# Patient Record
Sex: Male | Born: 1935 | Race: White | Hispanic: No | Marital: Married | Smoking: Current every day smoker
Health system: Southern US, Community
[De-identification: ages and names within clinical notes are randomized; demographics above are authoritative.]

## PROBLEM LIST (undated history)

## (undated) DIAGNOSIS — F0390 Unspecified dementia without behavioral disturbance: Secondary | ICD-10-CM

## (undated) DIAGNOSIS — J189 Pneumonia, unspecified organism: Secondary | ICD-10-CM

## (undated) DIAGNOSIS — I2510 Atherosclerotic heart disease of native coronary artery without angina pectoris: Secondary | ICD-10-CM

## (undated) DIAGNOSIS — C4490 Unspecified malignant neoplasm of skin, unspecified: Secondary | ICD-10-CM

## (undated) DIAGNOSIS — I714 Abdominal aortic aneurysm, without rupture, unspecified: Secondary | ICD-10-CM

## (undated) DIAGNOSIS — I10 Essential (primary) hypertension: Secondary | ICD-10-CM

## (undated) DIAGNOSIS — E7800 Pure hypercholesterolemia, unspecified: Secondary | ICD-10-CM

## (undated) DIAGNOSIS — W19XXXA Unspecified fall, initial encounter: Secondary | ICD-10-CM

## (undated) DIAGNOSIS — I4891 Unspecified atrial fibrillation: Secondary | ICD-10-CM

## (undated) DIAGNOSIS — J449 Chronic obstructive pulmonary disease, unspecified: Secondary | ICD-10-CM

## (undated) DIAGNOSIS — H9190 Unspecified hearing loss, unspecified ear: Secondary | ICD-10-CM

## (undated) DIAGNOSIS — M1990 Unspecified osteoarthritis, unspecified site: Secondary | ICD-10-CM

## (undated) DIAGNOSIS — Y92009 Unspecified place in unspecified non-institutional (private) residence as the place of occurrence of the external cause: Secondary | ICD-10-CM

## (undated) HISTORY — PX: TOOTH EXTRACTION: SUR596

## (undated) HISTORY — PX: INCISION AND DRAINAGE: SHX5863

## (undated) HISTORY — PX: CATARACT EXTRACTION, BILATERAL: SHX1313

## (undated) HISTORY — PX: APPENDECTOMY: SHX54

## (undated) HISTORY — PX: CORONARY ANGIOPLASTY WITH STENT PLACEMENT: SHX49

## (undated) HISTORY — PX: CARDIAC CATHETERIZATION: SHX172

---

## 2001-11-23 ENCOUNTER — Inpatient Hospital Stay (HOSPITAL_COMMUNITY): Admission: EM | Admit: 2001-11-23 | Discharge: 2001-11-27 | Payer: Self-pay | Admitting: Emergency Medicine

## 2001-11-23 ENCOUNTER — Encounter: Payer: Self-pay | Admitting: Emergency Medicine

## 2001-11-23 ENCOUNTER — Encounter (INDEPENDENT_AMBULATORY_CARE_PROVIDER_SITE_OTHER): Payer: Self-pay | Admitting: *Deleted

## 2009-08-15 ENCOUNTER — Emergency Department (HOSPITAL_COMMUNITY): Admission: EM | Admit: 2009-08-15 | Discharge: 2009-08-15 | Payer: Self-pay | Admitting: Emergency Medicine

## 2010-08-28 LAB — DIFFERENTIAL
Basophils Absolute: 0 10*3/uL (ref 0.0–0.1)
Basophils Relative: 1 % (ref 0–1)
Eosinophils Absolute: 1.2 10*3/uL — ABNORMAL HIGH (ref 0.0–0.7)
Eosinophils Relative: 17 % — ABNORMAL HIGH (ref 0–5)
Lymphocytes Relative: 11 % — ABNORMAL LOW (ref 12–46)
Lymphs Abs: 0.8 10*3/uL (ref 0.7–4.0)
Monocytes Absolute: 0.6 10*3/uL (ref 0.1–1.0)
Monocytes Relative: 8 % (ref 3–12)
Neutro Abs: 4.5 10*3/uL (ref 1.7–7.7)
Neutrophils Relative %: 64 % (ref 43–77)

## 2010-08-28 LAB — COMPREHENSIVE METABOLIC PANEL
ALT: 12 U/L (ref 0–53)
AST: 19 U/L (ref 0–37)
Albumin: 3.6 g/dL (ref 3.5–5.2)
Alkaline Phosphatase: 58 U/L (ref 39–117)
BUN: 15 mg/dL (ref 6–23)
CO2: 27 mEq/L (ref 19–32)
Calcium: 8.2 mg/dL — ABNORMAL LOW (ref 8.4–10.5)
Chloride: 103 mEq/L (ref 96–112)
Creatinine, Ser: 0.99 mg/dL (ref 0.4–1.5)
GFR calc Af Amer: >60 mL/min (ref >60)
GFR calc non Af Amer: >60 mL/min (ref >60)
Glucose, Bld: 93 mg/dL (ref 70–99)
Potassium: 3.6 mEq/L (ref 3.5–5.1)
Sodium: 135 mEq/L (ref 135–145)
Total Bilirubin: 0.6 mg/dL (ref 0.3–1.2)
Total Protein: 6.2 g/dL (ref 6.0–8.3)

## 2010-08-28 LAB — URINALYSIS, ROUTINE W REFLEX MICROSCOPIC
Glucose, UA: NEGATIVE mg/dL
Ketones, ur: NEGATIVE mg/dL
Leukocytes, UA: NEGATIVE
Nitrite: NEGATIVE
Specific Gravity, Urine: 1.018 (ref 1.005–1.030)
pH: 6.5 (ref 5.0–8.0)

## 2010-08-28 LAB — CBC
HCT: 48.6 % (ref 39.0–52.0)
Hemoglobin: 16.3 g/dL (ref 13.0–17.0)
MCHC: 33.6 g/dL (ref 30.0–36.0)
MCV: 99.9 fL (ref 78.0–100.0)
Platelets: 142 10*3/uL — ABNORMAL LOW (ref 150–400)
RBC: 4.86 MIL/uL (ref 4.22–5.81)
RDW: 13.8 % (ref 11.5–15.5)
WBC: 7.1 10*3/uL (ref 4.0–10.5)

## 2010-08-28 LAB — URINE MICROSCOPIC-ADD ON

## 2010-10-21 NOTE — Op Note (Signed)
. [facility]  Patient:    Jeremy Macdonald, Jeremy Macdonald Visit Number: [id number] MRN: [id number]          Service Type: MED Location: 5500 [phone] Attending Physician:  Cherylynn Ridges Dictated by:   Jimmye Norman, M.D. Proc. Date: 11/24/01 Admit Date:  11/23/2001                             Operative Report  PREOPERATIVE DIAGNOSIS:  Acute appendicitis.  POSTOPERATIVE DIAGNOSIS:  Acute gangrenous, ruptured appendicitis.  PROCEDURES: 1. Diagnostic laparoscopy. 2. Open appendectomy.  SURGEON:  Jimmye Norman, M.D.  ASSISTANT:  None.  ANESTHESIA:  General endotracheal.  ESTIMATED BLOOD LOSS:  150 cc.  COMPLICATIONS:  None.  BLEEDING:  From appendiceal artery.  CONDITION:  Stable.  DISPOSITION:  To PACU to unit 5700.  INDICATIONS FOR OPERATION:  The patient is a 77 year old with abdominal pain localized to the right lower quadrant.  He comes in for an appendectomy.  FINDINGS:  The patient, on laparoscopy, had a completely adhesed, very consolidated right lower quadrant abscess and gangrenous appendix.  This could not be taken out laparoscopically.  Also, during the procedure as the intraabdominal pressure would rise, the patients O2 saturations would drop. Therefore we had to abort the laparoscopic procedure for two reasons.  DESCRIPTION OF PROCEDURE:  The patient was taken to the operating room and placed on the table in the supine position.  After an adequate endotracheal anesthetic was administered, he was prepped and draped in the usual sterile manner, exposing the midline in the right lower quadrant.  A supraumbilical curvilinear incision was made using a #11 blade, taken down to the midline fascia -- through which a Veress needle was passed into the peritoneal cavity.  It was confirmed to be in position with the saline test. Once this was done, 10 and 11 mm cannulae and trocars were passed through the supraumbilical pouch into the  peritoneal cavity; confirmed to be in position with the laparoscope and attached camera light source.  With the patient in Trendelenburg position, left side tilted down, one could see the purulent fluid in the right lower quadrant and the adhesed stub mound cecum and the base of the appendix.  There were multiple adhesions of bowels attached to the appendix, but we could not free up the loop.  As we were doing so and the patients intraabdominal pressure rose to 13-15 mmHG, the O2 saturations dropped.  Initially we allowed all gas to escape and removed our instruments; allowed the patient to recover without problem.  His saturations came up once the pressure had gone down to 0.  We then insufflated again and the patients saturations dropped again.  Therefore, the decision was made to abort the laparoscopic procedure and to make right lower quadrant incision for any appendectomy -- because of how stuck and adhesed the appendix was, and also because of the dropping saturations.  We removed all cannulas and then made a right lower quadrant 6 cm incision using a #10 blade. It was taken down to and through the external oblique fascia, down into and through the transverse abdominis and internal oblique muscles, into the peritoneal cavity.  With Goelet retractors in place, we were able to mobilize the base of the cecum and the appendix (which had ruptured into the wound).  In doing so; however, the mesentery was torn and there was bleeding from the appendiceal artery.  This was eventually  controlled with figure-of-eight stitches of 2-0 silk.  The base of the appendix was taken using a TA30 stapler.  He was secured adequately.  Once we had control of the bleeding and the base was found to be adequate, we allowed the cecum to fall back into its peritoneal cavity.  We irrigated with saline up to approximately one liter.  We then closed in several layers, leaving the skin open.  The peritoneum was  closed using running 3-0 PDS.  The internal oblique and transverse abdominis muscle were reapproximated using  figure-of-eight stitch of old PDS.  The SL and deep fascia was closed using a running 0 PDS, then the skin was left open and packed with aspirin and Betadine soaked gauze.  The midline supraumbilical fascia was closed using figure-of-eight stitch of 0 Vicryl on a UR6 needle.  The skin was then closed using stainless steel staples at the supraumbilical site and the suprapubic sites.  Also in the right upper quadrant, where cannulas had been placed, these were closed also.  COUNTS:  All needle, sponge, and instrument counts were correct. Dictated by:   Jimmye Norman, M.D. Attending Physician:  Cherylynn Ridges DD:  11/24/01 TD:  11/25/01 Job: [id number]

## 2010-10-21 NOTE — Discharge Summary (Signed)
[place]. [facility]  Patient:    Jeremy Macdonald, Jeremy Macdonald Visit Number: [id number] MRN: [id number]          Service Type: MED Location: 5500 [phone] Attending Physician:  Cherylynn Ridges Dictated by:   Jimmye Norman, M.D. Admit Date:  11/23/2001 Discharge Date: 11/27/2001                             Discharge Summary  DISCHARGE DIAGNOSIS:  Ruptured acute appendicitis.  DISCHARGE MEDICATIONS: 1. Tylenol 2. Advil. 3. P.o. pain medications.  DIET:  Regular.  CONDITION ON DISCHARGE:  Stable.  HOSPITAL COURSE:  The patient was admitted with abdominal pain localized to the right lower quadrant.  He was taken to the OR where it was laparoscopically visualized ruptured appendix which had a lot of adhesions. Therefore, decision was made to open.  This was done with good ______.  He had an appendectomy without event.  He was eventually discharged home in care of his family.  He was afebrile at the time.  He was not sent home on antibiotics.  FOLLOWUP:  To see me in approximately one to two weeks. Dictated by:   Jimmye Norman, M.D. Attending Physician:  Cherylynn Ridges DD:  12/17/01 TD:  12/19/01 Job: [id number]

## 2010-10-21 NOTE — H&P (Signed)
[place]. [facility]  Patient:    Jeremy Macdonald, Jeremy Macdonald Visit Number: [id number] MRN: [id number]          Service Type: MED Location: 5500 [phone] Attending Physician:  Cherylynn Ridges Dictated by:   Jimmye Norman, M.D. Admit Date:  11/23/2001                           History and Physical  IDENTIFICATION AND COMPLAINT:  The patient is a 77 year old with likely acute appendicitis.  HISTORY OF PRESENT ILLNESS:  The patient came into the emergency room after over 24 hours of abdominal pain originating in the infraumbilical, periumbilical area, now localized to the right lower quadrant, associated with anorexia but no nausea, vomiting, but did have fevers and chills.  He had a fever of over 101 in the emergency room and right lower quadrant tenderness. A surgical consultation was obtained immediately.  PAST MEDICAL HISTORY:  Significant for only hypertension, for which he takes Lotensin.  He has had no cardiac, renal, vascular, or pulmonary disease.  ALLERGIES:  No known drug allergies.  SOCIAL HISTORY:  He is a nonsmoker.  SOCIAL HISTORY:  His only previous surgery has been for teeth, oral surgery.  REVIEW OF SYSTEMS:  He has had no diarrhea or constipation.  He has no nausea or vomiting.  PHYSICAL EXAMINATION:  VITAL SIGNS:  He had a temperature of 101.4.  His other vital signs were stable.  His temperature defervesced with Tylenol administered in the ED.  HEENT:  He is normocephalic and atraumatic and anicteric.  NECK:  Supple.  CHEST:  Clear.  CARDIAC:  No murmurs, gallops, rubs, or heaves.  ABDOMEN:  Soft but tender in the right lower quadrant with a positive Rovsing sign.  He does have abdominal bruits noted on both the left upper quadrant and the left lower quadrant and the right and the left upper quadrant.  It appears to be more prominent on the left, could represent renal artery stenosis.  RECTAL:  Unremarkable.  LABORATORY DATA:   White count of 14.7 thousand.  UA is reportedly normal.  IMPRESSION:  Acute appendicitis in a 77 year old male.  PLAN:  Do a laparoscopic appendectomy.  This will be done as soon as possible after the patient received preoperative antibiotics. Dictated by:   Jimmye Norman, M.D. Attending Physician:  Cherylynn Ridges DD:  11/23/01 TD:  11/25/01 Job: [id number]

## 2011-08-11 DIAGNOSIS — H2510 Age-related nuclear cataract, unspecified eye: Secondary | ICD-10-CM | POA: Diagnosis not present

## 2011-11-28 DIAGNOSIS — M25519 Pain in unspecified shoulder: Secondary | ICD-10-CM | POA: Diagnosis not present

## 2012-03-13 DIAGNOSIS — Z23 Encounter for immunization: Secondary | ICD-10-CM | POA: Diagnosis not present

## 2012-03-19 DIAGNOSIS — R310 Gross hematuria: Secondary | ICD-10-CM | POA: Diagnosis not present

## 2012-03-19 DIAGNOSIS — N401 Enlarged prostate with lower urinary tract symptoms: Secondary | ICD-10-CM | POA: Diagnosis not present

## 2012-03-22 DIAGNOSIS — H2510 Age-related nuclear cataract, unspecified eye: Secondary | ICD-10-CM | POA: Diagnosis not present

## 2012-04-12 DIAGNOSIS — Z79899 Other long term (current) drug therapy: Secondary | ICD-10-CM | POA: Diagnosis not present

## 2012-04-12 DIAGNOSIS — I10 Essential (primary) hypertension: Secondary | ICD-10-CM | POA: Diagnosis not present

## 2012-05-13 DIAGNOSIS — H269 Unspecified cataract: Secondary | ICD-10-CM | POA: Diagnosis not present

## 2012-05-13 DIAGNOSIS — H2510 Age-related nuclear cataract, unspecified eye: Secondary | ICD-10-CM | POA: Diagnosis not present

## 2012-05-14 DIAGNOSIS — H2510 Age-related nuclear cataract, unspecified eye: Secondary | ICD-10-CM | POA: Diagnosis not present

## 2012-06-03 DIAGNOSIS — H269 Unspecified cataract: Secondary | ICD-10-CM | POA: Diagnosis not present

## 2012-06-03 DIAGNOSIS — H2510 Age-related nuclear cataract, unspecified eye: Secondary | ICD-10-CM | POA: Diagnosis not present

## 2012-11-04 DIAGNOSIS — M25519 Pain in unspecified shoulder: Secondary | ICD-10-CM | POA: Diagnosis not present

## 2013-01-07 DIAGNOSIS — H18419 Arcus senilis, unspecified eye: Secondary | ICD-10-CM | POA: Diagnosis not present

## 2013-01-07 DIAGNOSIS — Z961 Presence of intraocular lens: Secondary | ICD-10-CM | POA: Diagnosis not present

## 2013-03-13 DIAGNOSIS — N400 Enlarged prostate without lower urinary tract symptoms: Secondary | ICD-10-CM | POA: Diagnosis not present

## 2013-03-13 DIAGNOSIS — M1990 Unspecified osteoarthritis, unspecified site: Secondary | ICD-10-CM | POA: Diagnosis not present

## 2013-03-13 DIAGNOSIS — L821 Other seborrheic keratosis: Secondary | ICD-10-CM | POA: Diagnosis not present

## 2013-03-13 DIAGNOSIS — I10 Essential (primary) hypertension: Secondary | ICD-10-CM | POA: Diagnosis not present

## 2013-03-13 DIAGNOSIS — Z23 Encounter for immunization: Secondary | ICD-10-CM | POA: Diagnosis not present

## 2013-03-24 DIAGNOSIS — N401 Enlarged prostate with lower urinary tract symptoms: Secondary | ICD-10-CM | POA: Diagnosis not present

## 2013-05-26 DIAGNOSIS — S6990XA Unspecified injury of unspecified wrist, hand and finger(s), initial encounter: Secondary | ICD-10-CM | POA: Diagnosis not present

## 2013-05-26 DIAGNOSIS — Z23 Encounter for immunization: Secondary | ICD-10-CM | POA: Diagnosis not present

## 2013-07-30 DIAGNOSIS — R05 Cough: Secondary | ICD-10-CM | POA: Diagnosis not present

## 2013-07-30 DIAGNOSIS — R059 Cough, unspecified: Secondary | ICD-10-CM | POA: Diagnosis not present

## 2014-01-13 DIAGNOSIS — Z961 Presence of intraocular lens: Secondary | ICD-10-CM | POA: Diagnosis not present

## 2014-01-13 DIAGNOSIS — H18419 Arcus senilis, unspecified eye: Secondary | ICD-10-CM | POA: Diagnosis not present

## 2014-01-13 DIAGNOSIS — H02839 Dermatochalasis of unspecified eye, unspecified eyelid: Secondary | ICD-10-CM | POA: Diagnosis not present

## 2014-03-17 DIAGNOSIS — Z23 Encounter for immunization: Secondary | ICD-10-CM | POA: Diagnosis not present

## 2014-03-25 DIAGNOSIS — R310 Gross hematuria: Secondary | ICD-10-CM | POA: Diagnosis not present

## 2014-03-25 DIAGNOSIS — N401 Enlarged prostate with lower urinary tract symptoms: Secondary | ICD-10-CM | POA: Diagnosis not present

## 2014-03-25 DIAGNOSIS — R351 Nocturia: Secondary | ICD-10-CM | POA: Diagnosis not present

## 2014-05-14 DIAGNOSIS — F17200 Nicotine dependence, unspecified, uncomplicated: Secondary | ICD-10-CM | POA: Diagnosis not present

## 2014-05-14 DIAGNOSIS — J449 Chronic obstructive pulmonary disease, unspecified: Secondary | ICD-10-CM | POA: Diagnosis not present

## 2014-05-14 DIAGNOSIS — J40 Bronchitis, not specified as acute or chronic: Secondary | ICD-10-CM | POA: Diagnosis not present

## 2014-05-21 DIAGNOSIS — J40 Bronchitis, not specified as acute or chronic: Secondary | ICD-10-CM | POA: Diagnosis not present

## 2014-05-21 DIAGNOSIS — J449 Chronic obstructive pulmonary disease, unspecified: Secondary | ICD-10-CM | POA: Diagnosis not present

## 2014-05-21 DIAGNOSIS — F17200 Nicotine dependence, unspecified, uncomplicated: Secondary | ICD-10-CM | POA: Diagnosis not present

## 2014-07-02 DIAGNOSIS — J449 Chronic obstructive pulmonary disease, unspecified: Secondary | ICD-10-CM | POA: Diagnosis not present

## 2014-07-02 DIAGNOSIS — M1990 Unspecified osteoarthritis, unspecified site: Secondary | ICD-10-CM | POA: Diagnosis not present

## 2014-07-02 DIAGNOSIS — F17200 Nicotine dependence, unspecified, uncomplicated: Secondary | ICD-10-CM | POA: Diagnosis not present

## 2014-07-02 DIAGNOSIS — I10 Essential (primary) hypertension: Secondary | ICD-10-CM | POA: Diagnosis not present

## 2014-07-02 DIAGNOSIS — J209 Acute bronchitis, unspecified: Secondary | ICD-10-CM | POA: Diagnosis not present

## 2014-07-02 DIAGNOSIS — N400 Enlarged prostate without lower urinary tract symptoms: Secondary | ICD-10-CM | POA: Diagnosis not present

## 2014-10-01 DIAGNOSIS — I10 Essential (primary) hypertension: Secondary | ICD-10-CM | POA: Diagnosis not present

## 2014-10-01 DIAGNOSIS — H6980 Other specified disorders of Eustachian tube, unspecified ear: Secondary | ICD-10-CM | POA: Diagnosis not present

## 2014-10-01 DIAGNOSIS — J449 Chronic obstructive pulmonary disease, unspecified: Secondary | ICD-10-CM | POA: Diagnosis not present

## 2014-10-01 DIAGNOSIS — N400 Enlarged prostate without lower urinary tract symptoms: Secondary | ICD-10-CM | POA: Diagnosis not present

## 2014-10-01 DIAGNOSIS — R739 Hyperglycemia, unspecified: Secondary | ICD-10-CM | POA: Diagnosis not present

## 2014-10-01 DIAGNOSIS — M1990 Unspecified osteoarthritis, unspecified site: Secondary | ICD-10-CM | POA: Diagnosis not present

## 2014-10-01 DIAGNOSIS — F17200 Nicotine dependence, unspecified, uncomplicated: Secondary | ICD-10-CM | POA: Diagnosis not present

## 2015-01-07 DIAGNOSIS — J449 Chronic obstructive pulmonary disease, unspecified: Secondary | ICD-10-CM | POA: Diagnosis not present

## 2015-01-07 DIAGNOSIS — N400 Enlarged prostate without lower urinary tract symptoms: Secondary | ICD-10-CM | POA: Diagnosis not present

## 2015-01-07 DIAGNOSIS — I10 Essential (primary) hypertension: Secondary | ICD-10-CM | POA: Diagnosis not present

## 2015-01-07 DIAGNOSIS — L570 Actinic keratosis: Secondary | ICD-10-CM | POA: Diagnosis not present

## 2015-01-07 DIAGNOSIS — F17200 Nicotine dependence, unspecified, uncomplicated: Secondary | ICD-10-CM | POA: Diagnosis not present

## 2015-01-07 DIAGNOSIS — M1990 Unspecified osteoarthritis, unspecified site: Secondary | ICD-10-CM | POA: Diagnosis not present

## 2015-03-31 DIAGNOSIS — R351 Nocturia: Secondary | ICD-10-CM | POA: Diagnosis not present

## 2015-03-31 DIAGNOSIS — R3121 Asymptomatic microscopic hematuria: Secondary | ICD-10-CM | POA: Diagnosis not present

## 2015-03-31 DIAGNOSIS — N401 Enlarged prostate with lower urinary tract symptoms: Secondary | ICD-10-CM | POA: Diagnosis not present

## 2015-03-31 DIAGNOSIS — N390 Urinary tract infection, site not specified: Secondary | ICD-10-CM | POA: Diagnosis not present

## 2015-04-08 ENCOUNTER — Ambulatory Visit (INDEPENDENT_AMBULATORY_CARE_PROVIDER_SITE_OTHER): Payer: Medicare Other

## 2015-04-08 DIAGNOSIS — Z23 Encounter for immunization: Secondary | ICD-10-CM

## 2015-04-26 DIAGNOSIS — R3129 Other microscopic hematuria: Secondary | ICD-10-CM | POA: Diagnosis not present

## 2015-05-04 DIAGNOSIS — R310 Gross hematuria: Secondary | ICD-10-CM | POA: Diagnosis not present

## 2015-05-04 DIAGNOSIS — N401 Enlarged prostate with lower urinary tract symptoms: Secondary | ICD-10-CM | POA: Diagnosis not present

## 2015-05-04 DIAGNOSIS — R351 Nocturia: Secondary | ICD-10-CM | POA: Diagnosis not present

## 2015-05-10 DIAGNOSIS — N400 Enlarged prostate without lower urinary tract symptoms: Secondary | ICD-10-CM | POA: Diagnosis not present

## 2015-05-10 DIAGNOSIS — M1990 Unspecified osteoarthritis, unspecified site: Secondary | ICD-10-CM | POA: Diagnosis not present

## 2015-05-10 DIAGNOSIS — F17200 Nicotine dependence, unspecified, uncomplicated: Secondary | ICD-10-CM | POA: Diagnosis not present

## 2015-05-10 DIAGNOSIS — I10 Essential (primary) hypertension: Secondary | ICD-10-CM | POA: Diagnosis not present

## 2015-05-10 DIAGNOSIS — J449 Chronic obstructive pulmonary disease, unspecified: Secondary | ICD-10-CM | POA: Diagnosis not present

## 2015-05-10 DIAGNOSIS — L570 Actinic keratosis: Secondary | ICD-10-CM | POA: Diagnosis not present

## 2015-05-11 DIAGNOSIS — I10 Essential (primary) hypertension: Secondary | ICD-10-CM | POA: Diagnosis not present

## 2015-05-11 DIAGNOSIS — Z961 Presence of intraocular lens: Secondary | ICD-10-CM | POA: Diagnosis not present

## 2015-05-11 DIAGNOSIS — H02839 Dermatochalasis of unspecified eye, unspecified eyelid: Secondary | ICD-10-CM | POA: Diagnosis not present

## 2015-05-11 DIAGNOSIS — H18413 Arcus senilis, bilateral: Secondary | ICD-10-CM | POA: Diagnosis not present

## 2015-05-19 DIAGNOSIS — Z87891 Personal history of nicotine dependence: Secondary | ICD-10-CM | POA: Diagnosis not present

## 2015-05-19 DIAGNOSIS — I714 Abdominal aortic aneurysm, without rupture: Secondary | ICD-10-CM | POA: Diagnosis not present

## 2015-05-19 DIAGNOSIS — I70213 Atherosclerosis of native arteries of extremities with intermittent claudication, bilateral legs: Secondary | ICD-10-CM | POA: Diagnosis not present

## 2015-05-19 DIAGNOSIS — I10 Essential (primary) hypertension: Secondary | ICD-10-CM | POA: Diagnosis not present

## 2015-05-24 DIAGNOSIS — R0602 Shortness of breath: Secondary | ICD-10-CM | POA: Diagnosis not present

## 2015-05-24 DIAGNOSIS — I10 Essential (primary) hypertension: Secondary | ICD-10-CM | POA: Diagnosis not present

## 2015-06-01 DIAGNOSIS — I10 Essential (primary) hypertension: Secondary | ICD-10-CM | POA: Diagnosis not present

## 2015-06-01 DIAGNOSIS — R0602 Shortness of breath: Secondary | ICD-10-CM | POA: Diagnosis not present

## 2015-06-01 DIAGNOSIS — R0989 Other specified symptoms and signs involving the circulatory and respiratory systems: Secondary | ICD-10-CM | POA: Diagnosis not present

## 2015-06-03 DIAGNOSIS — I739 Peripheral vascular disease, unspecified: Secondary | ICD-10-CM | POA: Diagnosis not present

## 2015-06-18 DIAGNOSIS — I10 Essential (primary) hypertension: Secondary | ICD-10-CM | POA: Diagnosis not present

## 2015-06-18 DIAGNOSIS — I70213 Atherosclerosis of native arteries of extremities with intermittent claudication, bilateral legs: Secondary | ICD-10-CM | POA: Diagnosis not present

## 2015-06-18 DIAGNOSIS — I714 Abdominal aortic aneurysm, without rupture: Secondary | ICD-10-CM | POA: Diagnosis not present

## 2015-06-18 DIAGNOSIS — Z87891 Personal history of nicotine dependence: Secondary | ICD-10-CM | POA: Diagnosis not present

## 2015-06-25 DIAGNOSIS — D3132 Benign neoplasm of left choroid: Secondary | ICD-10-CM | POA: Diagnosis not present

## 2015-06-25 DIAGNOSIS — H353131 Nonexudative age-related macular degeneration, bilateral, early dry stage: Secondary | ICD-10-CM | POA: Diagnosis not present

## 2015-06-25 DIAGNOSIS — H35373 Puckering of macula, bilateral: Secondary | ICD-10-CM | POA: Diagnosis not present

## 2015-06-25 DIAGNOSIS — H43813 Vitreous degeneration, bilateral: Secondary | ICD-10-CM | POA: Diagnosis not present

## 2015-08-17 DIAGNOSIS — I714 Abdominal aortic aneurysm, without rupture: Secondary | ICD-10-CM | POA: Diagnosis not present

## 2015-09-07 DIAGNOSIS — I739 Peripheral vascular disease, unspecified: Secondary | ICD-10-CM | POA: Diagnosis not present

## 2015-09-07 DIAGNOSIS — I10 Essential (primary) hypertension: Secondary | ICD-10-CM | POA: Diagnosis not present

## 2015-09-07 DIAGNOSIS — M1990 Unspecified osteoarthritis, unspecified site: Secondary | ICD-10-CM | POA: Diagnosis not present

## 2015-09-07 DIAGNOSIS — F17200 Nicotine dependence, unspecified, uncomplicated: Secondary | ICD-10-CM | POA: Diagnosis not present

## 2015-09-07 DIAGNOSIS — N400 Enlarged prostate without lower urinary tract symptoms: Secondary | ICD-10-CM | POA: Diagnosis not present

## 2015-09-07 DIAGNOSIS — I714 Abdominal aortic aneurysm, without rupture: Secondary | ICD-10-CM | POA: Diagnosis not present

## 2015-09-07 DIAGNOSIS — J449 Chronic obstructive pulmonary disease, unspecified: Secondary | ICD-10-CM | POA: Diagnosis not present

## 2015-09-16 DIAGNOSIS — R0602 Shortness of breath: Secondary | ICD-10-CM | POA: Diagnosis not present

## 2015-09-16 DIAGNOSIS — I714 Abdominal aortic aneurysm, without rupture: Secondary | ICD-10-CM | POA: Diagnosis not present

## 2015-09-16 DIAGNOSIS — I10 Essential (primary) hypertension: Secondary | ICD-10-CM | POA: Diagnosis not present

## 2015-09-16 DIAGNOSIS — I70213 Atherosclerosis of native arteries of extremities with intermittent claudication, bilateral legs: Secondary | ICD-10-CM | POA: Diagnosis not present

## 2015-11-02 DIAGNOSIS — R351 Nocturia: Secondary | ICD-10-CM | POA: Diagnosis not present

## 2015-11-02 DIAGNOSIS — N401 Enlarged prostate with lower urinary tract symptoms: Secondary | ICD-10-CM | POA: Diagnosis not present

## 2015-11-02 DIAGNOSIS — N138 Other obstructive and reflux uropathy: Secondary | ICD-10-CM | POA: Diagnosis not present

## 2015-11-02 DIAGNOSIS — Z0000 Encounter for general adult medical examination without abnormal findings: Secondary | ICD-10-CM | POA: Diagnosis not present

## 2016-01-07 DIAGNOSIS — I10 Essential (primary) hypertension: Secondary | ICD-10-CM | POA: Diagnosis not present

## 2016-01-07 DIAGNOSIS — L570 Actinic keratosis: Secondary | ICD-10-CM | POA: Diagnosis not present

## 2016-01-07 DIAGNOSIS — N400 Enlarged prostate without lower urinary tract symptoms: Secondary | ICD-10-CM | POA: Diagnosis not present

## 2016-01-07 DIAGNOSIS — Z5181 Encounter for therapeutic drug level monitoring: Secondary | ICD-10-CM | POA: Diagnosis not present

## 2016-01-07 DIAGNOSIS — F17200 Nicotine dependence, unspecified, uncomplicated: Secondary | ICD-10-CM | POA: Diagnosis not present

## 2016-01-07 DIAGNOSIS — M1990 Unspecified osteoarthritis, unspecified site: Secondary | ICD-10-CM | POA: Diagnosis not present

## 2016-01-07 DIAGNOSIS — I714 Abdominal aortic aneurysm, without rupture: Secondary | ICD-10-CM | POA: Diagnosis not present

## 2016-01-07 DIAGNOSIS — J449 Chronic obstructive pulmonary disease, unspecified: Secondary | ICD-10-CM | POA: Diagnosis not present

## 2016-01-07 DIAGNOSIS — I739 Peripheral vascular disease, unspecified: Secondary | ICD-10-CM | POA: Diagnosis not present

## 2016-02-15 DIAGNOSIS — I6523 Occlusion and stenosis of bilateral carotid arteries: Secondary | ICD-10-CM | POA: Diagnosis not present

## 2016-02-15 DIAGNOSIS — I714 Abdominal aortic aneurysm, without rupture: Secondary | ICD-10-CM | POA: Diagnosis not present

## 2016-02-24 DIAGNOSIS — I10 Essential (primary) hypertension: Secondary | ICD-10-CM | POA: Diagnosis not present

## 2016-02-24 DIAGNOSIS — I714 Abdominal aortic aneurysm, without rupture: Secondary | ICD-10-CM | POA: Diagnosis not present

## 2016-02-24 DIAGNOSIS — I6523 Occlusion and stenosis of bilateral carotid arteries: Secondary | ICD-10-CM | POA: Diagnosis not present

## 2016-02-24 DIAGNOSIS — Z87891 Personal history of nicotine dependence: Secondary | ICD-10-CM | POA: Diagnosis not present

## 2016-03-16 DIAGNOSIS — D485 Neoplasm of uncertain behavior of skin: Secondary | ICD-10-CM | POA: Diagnosis not present

## 2016-03-16 DIAGNOSIS — L821 Other seborrheic keratosis: Secondary | ICD-10-CM | POA: Diagnosis not present

## 2016-03-16 DIAGNOSIS — L570 Actinic keratosis: Secondary | ICD-10-CM | POA: Diagnosis not present

## 2016-03-16 DIAGNOSIS — D1801 Hemangioma of skin and subcutaneous tissue: Secondary | ICD-10-CM | POA: Diagnosis not present

## 2016-03-16 DIAGNOSIS — C44329 Squamous cell carcinoma of skin of other parts of face: Secondary | ICD-10-CM | POA: Diagnosis not present

## 2016-03-16 DIAGNOSIS — L814 Other melanin hyperpigmentation: Secondary | ICD-10-CM | POA: Diagnosis not present

## 2016-03-16 DIAGNOSIS — C44602 Unspecified malignant neoplasm of skin of right upper limb, including shoulder: Secondary | ICD-10-CM | POA: Diagnosis not present

## 2016-03-23 ENCOUNTER — Ambulatory Visit (INDEPENDENT_AMBULATORY_CARE_PROVIDER_SITE_OTHER): Payer: Medicare Other | Admitting: Family Medicine

## 2016-03-23 DIAGNOSIS — Z23 Encounter for immunization: Secondary | ICD-10-CM

## 2016-04-03 DIAGNOSIS — R05 Cough: Secondary | ICD-10-CM | POA: Diagnosis not present

## 2016-04-12 DIAGNOSIS — C44622 Squamous cell carcinoma of skin of right upper limb, including shoulder: Secondary | ICD-10-CM | POA: Diagnosis not present

## 2016-04-12 DIAGNOSIS — L900 Lichen sclerosus et atrophicus: Secondary | ICD-10-CM | POA: Diagnosis not present

## 2016-04-21 DIAGNOSIS — C44329 Squamous cell carcinoma of skin of other parts of face: Secondary | ICD-10-CM | POA: Diagnosis not present

## 2016-05-08 DIAGNOSIS — I10 Essential (primary) hypertension: Secondary | ICD-10-CM | POA: Diagnosis not present

## 2016-05-08 DIAGNOSIS — I714 Abdominal aortic aneurysm, without rupture: Secondary | ICD-10-CM | POA: Diagnosis not present

## 2016-05-08 DIAGNOSIS — I739 Peripheral vascular disease, unspecified: Secondary | ICD-10-CM | POA: Diagnosis not present

## 2016-05-08 DIAGNOSIS — M1990 Unspecified osteoarthritis, unspecified site: Secondary | ICD-10-CM | POA: Diagnosis not present

## 2016-05-08 DIAGNOSIS — Z5181 Encounter for therapeutic drug level monitoring: Secondary | ICD-10-CM | POA: Diagnosis not present

## 2016-05-08 DIAGNOSIS — J449 Chronic obstructive pulmonary disease, unspecified: Secondary | ICD-10-CM | POA: Diagnosis not present

## 2016-05-08 DIAGNOSIS — L570 Actinic keratosis: Secondary | ICD-10-CM | POA: Diagnosis not present

## 2016-05-08 DIAGNOSIS — F17200 Nicotine dependence, unspecified, uncomplicated: Secondary | ICD-10-CM | POA: Diagnosis not present

## 2016-05-08 DIAGNOSIS — N400 Enlarged prostate without lower urinary tract symptoms: Secondary | ICD-10-CM | POA: Diagnosis not present

## 2016-05-16 DIAGNOSIS — Z961 Presence of intraocular lens: Secondary | ICD-10-CM | POA: Diagnosis not present

## 2016-05-16 DIAGNOSIS — H02839 Dermatochalasis of unspecified eye, unspecified eyelid: Secondary | ICD-10-CM | POA: Diagnosis not present

## 2016-05-16 DIAGNOSIS — H18413 Arcus senilis, bilateral: Secondary | ICD-10-CM | POA: Diagnosis not present

## 2016-05-16 DIAGNOSIS — I10 Essential (primary) hypertension: Secondary | ICD-10-CM | POA: Diagnosis not present

## 2016-06-05 DIAGNOSIS — C4490 Unspecified malignant neoplasm of skin, unspecified: Secondary | ICD-10-CM

## 2016-06-05 HISTORY — PX: SKIN CANCER EXCISION: SHX779

## 2016-06-05 HISTORY — DX: Unspecified malignant neoplasm of skin, unspecified: C44.90

## 2016-08-24 DIAGNOSIS — I6523 Occlusion and stenosis of bilateral carotid arteries: Secondary | ICD-10-CM | POA: Diagnosis not present

## 2016-08-24 DIAGNOSIS — Z87891 Personal history of nicotine dependence: Secondary | ICD-10-CM | POA: Diagnosis not present

## 2016-08-24 DIAGNOSIS — I714 Abdominal aortic aneurysm, without rupture: Secondary | ICD-10-CM | POA: Diagnosis not present

## 2016-08-24 DIAGNOSIS — I10 Essential (primary) hypertension: Secondary | ICD-10-CM | POA: Diagnosis not present

## 2016-09-07 DIAGNOSIS — I10 Essential (primary) hypertension: Secondary | ICD-10-CM | POA: Diagnosis not present

## 2016-09-07 DIAGNOSIS — I739 Peripheral vascular disease, unspecified: Secondary | ICD-10-CM | POA: Diagnosis not present

## 2016-09-07 DIAGNOSIS — M1990 Unspecified osteoarthritis, unspecified site: Secondary | ICD-10-CM | POA: Diagnosis not present

## 2016-09-07 DIAGNOSIS — I714 Abdominal aortic aneurysm, without rupture: Secondary | ICD-10-CM | POA: Diagnosis not present

## 2016-09-07 DIAGNOSIS — Z5181 Encounter for therapeutic drug level monitoring: Secondary | ICD-10-CM | POA: Diagnosis not present

## 2016-09-07 DIAGNOSIS — N400 Enlarged prostate without lower urinary tract symptoms: Secondary | ICD-10-CM | POA: Diagnosis not present

## 2016-09-07 DIAGNOSIS — J449 Chronic obstructive pulmonary disease, unspecified: Secondary | ICD-10-CM | POA: Diagnosis not present

## 2016-09-07 DIAGNOSIS — M160 Bilateral primary osteoarthritis of hip: Secondary | ICD-10-CM | POA: Diagnosis not present

## 2016-09-07 DIAGNOSIS — F17200 Nicotine dependence, unspecified, uncomplicated: Secondary | ICD-10-CM | POA: Diagnosis not present

## 2016-09-07 DIAGNOSIS — L570 Actinic keratosis: Secondary | ICD-10-CM | POA: Diagnosis not present

## 2016-09-28 DIAGNOSIS — L814 Other melanin hyperpigmentation: Secondary | ICD-10-CM | POA: Diagnosis not present

## 2016-09-28 DIAGNOSIS — D1801 Hemangioma of skin and subcutaneous tissue: Secondary | ICD-10-CM | POA: Diagnosis not present

## 2016-09-28 DIAGNOSIS — L570 Actinic keratosis: Secondary | ICD-10-CM | POA: Diagnosis not present

## 2016-09-28 DIAGNOSIS — L821 Other seborrheic keratosis: Secondary | ICD-10-CM | POA: Diagnosis not present

## 2016-09-28 DIAGNOSIS — Z85828 Personal history of other malignant neoplasm of skin: Secondary | ICD-10-CM | POA: Diagnosis not present

## 2016-11-09 DIAGNOSIS — N401 Enlarged prostate with lower urinary tract symptoms: Secondary | ICD-10-CM | POA: Diagnosis not present

## 2016-11-09 DIAGNOSIS — R351 Nocturia: Secondary | ICD-10-CM | POA: Diagnosis not present

## 2016-11-09 DIAGNOSIS — R3129 Other microscopic hematuria: Secondary | ICD-10-CM | POA: Diagnosis not present

## 2016-11-21 ENCOUNTER — Emergency Department (HOSPITAL_COMMUNITY): Payer: Medicare Other

## 2016-11-21 ENCOUNTER — Other Ambulatory Visit (HOSPITAL_COMMUNITY): Payer: Self-pay | Admitting: Radiology

## 2016-11-21 ENCOUNTER — Encounter (HOSPITAL_COMMUNITY): Payer: Self-pay

## 2016-11-21 ENCOUNTER — Emergency Department (HOSPITAL_COMMUNITY)
Admission: EM | Admit: 2016-11-21 | Discharge: 2016-11-21 | Disposition: A | Discharge status: Home / Self Care | Payer: Medicare Other | Attending: Emergency Medicine | Admitting: Emergency Medicine

## 2016-11-21 DIAGNOSIS — J449 Chronic obstructive pulmonary disease, unspecified: Secondary | ICD-10-CM | POA: Diagnosis not present

## 2016-11-21 DIAGNOSIS — Z7982 Long term (current) use of aspirin: Secondary | ICD-10-CM | POA: Insufficient documentation

## 2016-11-21 DIAGNOSIS — Z791 Long term (current) use of non-steroidal anti-inflammatories (NSAID): Secondary | ICD-10-CM | POA: Diagnosis not present

## 2016-11-21 DIAGNOSIS — I10 Essential (primary) hypertension: Secondary | ICD-10-CM | POA: Insufficient documentation

## 2016-11-21 DIAGNOSIS — Z79899 Other long term (current) drug therapy: Secondary | ICD-10-CM | POA: Insufficient documentation

## 2016-11-21 DIAGNOSIS — F17200 Nicotine dependence, unspecified, uncomplicated: Secondary | ICD-10-CM | POA: Insufficient documentation

## 2016-11-21 DIAGNOSIS — R000 Tachycardia, unspecified: Secondary | ICD-10-CM | POA: Diagnosis not present

## 2016-11-21 DIAGNOSIS — I4891 Unspecified atrial fibrillation: Secondary | ICD-10-CM | POA: Diagnosis not present

## 2016-11-21 DIAGNOSIS — R531 Weakness: Secondary | ICD-10-CM | POA: Diagnosis not present

## 2016-11-21 DIAGNOSIS — R0789 Other chest pain: Secondary | ICD-10-CM | POA: Diagnosis not present

## 2016-11-21 DIAGNOSIS — I499 Cardiac arrhythmia, unspecified: Secondary | ICD-10-CM | POA: Diagnosis not present

## 2016-11-21 HISTORY — DX: Abdominal aortic aneurysm, without rupture, unspecified: I71.40

## 2016-11-21 HISTORY — DX: Abdominal aortic aneurysm, without rupture: I71.4

## 2016-11-21 HISTORY — DX: Essential (primary) hypertension: I10

## 2016-11-21 HISTORY — DX: Chronic obstructive pulmonary disease, unspecified: J44.9

## 2016-11-21 LAB — CBC
HEMATOCRIT: 48.5 % (ref 39.0–52.0)
HEMOGLOBIN: 16.1 g/dL (ref 13.0–17.0)
MCH: 32.1 pg (ref 26.0–34.0)
MCHC: 33.2 g/dL (ref 30.0–36.0)
MCV: 96.6 fL (ref 78.0–100.0)
Platelets: 130 10*3/uL — ABNORMAL LOW (ref 150–400)
RBC: 5.02 MIL/uL (ref 4.22–5.81)
RDW: 13.3 % (ref 11.5–15.5)
WBC: 8.6 10*3/uL (ref 4.0–10.5)

## 2016-11-21 LAB — BASIC METABOLIC PANEL
Anion gap: 12 (ref 5–15)
BUN: 26 mg/dL — ABNORMAL HIGH (ref 6–20)
CHLORIDE: 101 mmol/L (ref 101–111)
CO2: 23 mmol/L (ref 22–32)
Calcium: 8.9 mg/dL (ref 8.9–10.3)
Creatinine, Ser: 1.25 mg/dL — ABNORMAL HIGH (ref 0.61–1.24)
GFR calc Af Amer: 57 mL/min — ABNORMAL LOW (ref >60)
GFR, EST NON AFRICAN AMERICAN: 50 mL/min — AB (ref >60)
GLUCOSE: 105 mg/dL — AB (ref 65–99)
POTASSIUM: 4.1 mmol/L (ref 3.5–5.1)
Sodium: 136 mmol/L (ref 135–145)

## 2016-11-21 LAB — I-STAT TROPONIN, ED: Troponin i, poc: 0.04 ng/mL (ref 0.00–0.08)

## 2016-11-21 MED ORDER — SODIUM CHLORIDE 0.9 % IV BOLUS (SEPSIS)
500.0000 mL | Freq: Once | INTRAVENOUS | Status: AC
  Administered 2016-11-21: 500 mL via INTRAVENOUS

## 2016-11-21 NOTE — ED Provider Notes (Signed)
[place] DEPT Provider Note   CSN: [id number] Arrival date & time: 11/21/16  1924     History   Chief Complaint Chief Complaint  Patient presents with  . Chest Pain    HPI Jeremy Macdonald is a 83 y.o. male.  Patient is here for evaluation of weakness, with a vague chest discomfort, and a sensation of palpitation and mild shortness of breath for about 8 hours, following shooting at a gun club.  He noticed it was very hot in the area he was, and afterwards when he was walking outside he began to feel worse.  Later this evening he went to a clinic, where he was found to be in rapid atrial fibrillation.  No prior history of atrial fibrillation.  Remotely he has had some periods of regular heartbeat, typically associated with eating.  He denies other illnesses recently including nausea, vomiting, focal weakness, paresthesias, headache neck pain back pain or abdominal pain.  He has a history of an abdominal aortic aneurysm.  There are no other known modifying factors.  HPI  Past Medical History:  Diagnosis Date  . Abdominal aortic aneurysm (AAA) ([place])   . COPD (chronic obstructive pulmonary disease) ([place])   . Hypertension     There are no active problems to display for this patient.   History reviewed. No pertinent surgical history.     Home Medications    Prior to Admission medications   Medication Sig Start Date End Date Taking? Authorizing Provider  albuterol (PROVENTIL) (2.5 MG/3ML) 0.083% nebulizer solution Take 2.5 mg by nebulization every 6 (six) hours as needed for wheezing or shortness of breath.   Yes [provider]  aspirin 81 MG chewable tablet Chew 81 mg by mouth daily.   Yes [provider]  benazepril-hydrochlorthiazide (LOTENSIN HCT) 20-25 MG tablet Take 1 tablet by mouth daily.   Yes [provider]  Dutasteride-Tamsulosin HCl (JALYN) 0.5-0.4 MG CAPS Take 1 tablet by mouth daily.   Yes [provider]  meloxicam  (MOBIC) 7.5 MG tablet Take 15 mg by mouth daily.   Yes [provider]  metoprolol tartrate (LOPRESSOR) 25 MG tablet Take 25 mg by mouth 2 (two) times daily.   Yes [provider]  rosuvastatin (CRESTOR) 10 MG tablet Take 10 mg by mouth daily.   Yes [provider]    Family History History reviewed. No pertinent family history.  Social History Social History  Substance Use Topics  . Smoking status: Current Every Day Smoker  . Smokeless tobacco: Current User  . Alcohol use No     Allergies   Phenergan [promethazine hcl]   Review of Systems Review of Systems  All other systems reviewed and are negative.    Physical Exam Updated Vital Signs BP (!) 124/91   Pulse 86   Temp 97.8 F (36.6 C) (Oral)   Resp (!) 23   Ht 5\' 9"  (1.753 m)   Wt 77.1 kg (170 lb)   SpO2 99%   BMI 25.10 kg/m   Physical Exam  Constitutional: He is oriented to person, place, and time. He appears well-developed.  Elderly, frail  HENT:  Head: Normocephalic and atraumatic.  Right Ear: External ear normal.  Left Ear: External ear normal.  Eyes: Conjunctivae and EOM are normal. Pupils are equal, round, and reactive to light.  Neck: Normal range of motion and phonation normal. Neck supple.  Cardiovascular:  Irregular tachycardia  Pulmonary/Chest: Effort normal and breath sounds normal. He exhibits no bony  tenderness.  Abdominal: Soft. There is no tenderness.  Musculoskeletal: Normal range of motion.  Neurological: He is alert and oriented to person, place, and time. No cranial nerve deficit or sensory deficit. He exhibits normal muscle tone. Coordination normal.  No dysarthria and aphasia or nystagmus.  Skin: Skin is warm, dry and intact.  Psychiatric: He has a normal mood and affect. His behavior is normal. Judgment and thought content normal.  Nursing note and vitals reviewed.    ED Treatments / Results  Labs (all labs ordered are listed, but only abnormal results  are displayed) Labs Reviewed  BASIC METABOLIC PANEL - Abnormal; Notable for the following:       Result Value   Glucose, Bld 105 (*)    BUN 26 (*)    Creatinine, Ser 1.25 (*)    GFR calc non Af Amer 50 (*)    GFR calc Af Amer 57 (*)    All other components within normal limits  CBC - Abnormal; Notable for the following:    Platelets 130 (*)    All other components within normal limits  I-STAT TROPOININ, ED    EKG  EKG Interpretation  Date/Time:  Tuesday November 21 2016 21:12:54 EDT Ventricular Rate:  96 PR Interval:    QRS Duration: 102 QT Interval:  382 QTC Calculation: 395 R Axis:   -27 Text Interpretation:  Sinus rhythm Multiform ventricular premature complexes Borderline left axis deviation Anterior infarct, old Since last tracing of earlier today (19:30) Atrial Fibrillation with RVR has resolvrd. PVC are now present Confirmed by Daleen Bo [phone]) on 11/21/2016 9:42:53 PM          Rate: 138  Rhythm: atrial fibrillation  QRS Axis: normal  PR and QT Intervals: Normal QT  ST/T Wave abnormalities: normal  PR and QRS Conduction Disutrbances: Normal QRS  Narrative Interpretation: Atrial fibrillation with rapid response  Old EKG Reviewed: none available   Radiology Dg Chest Port 1 View  Result Date: 11/21/2016 CLINICAL DATA:  83 year old male with palpitation. EXAM: PORTABLE CHEST 1 VIEW COMPARISON:  Chest radiograph dated 04/03/2016 FINDINGS: The lungs are clear. There is no pleural effusion or pneumothorax. The cardiac silhouette is within normal limits. There is atherosclerotic calcification of the thoracic aorta. No acute osseous pathology. IMPRESSION: No active disease. Electronically Signed   By: Anner Crete M.D.   On: 11/21/2016 22:13    Procedures Procedures (including critical care time)  Medications Ordered in ED Medications  sodium chloride 0.9 % bolus 500 mL (0 mLs Intravenous Stopped 11/21/16 2114)     Initial Impression / Assessment and Plan /  ED Course  I have reviewed the triage vital signs and the nursing notes.  Pertinent labs & imaging results that were available during my care of the patient were reviewed by me and considered in my medical decision making (see chart for details).  Clinical Course as of Nov 22 2235  Tue Nov 21, 2016  2230 At this time is alert cooperative and comfortable.  Oxygen saturation 91% on room air.  Heart rate 99.  He continues to have normal sinus rhythm on the cardiac monitor.  He was able ambulate with slight elevation of his heart rate to 113.  She did not get dizzy with ambulation.  [EW]    Clinical Course User Index [EW] Daleen Bo, MD   This patients CHA2DS2-VASc Score and unadjusted Ischemic Stroke Rate (% per year) is equal to 2.2 % stroke rate/year from a score of 2  Above score calculated as 1 point each if present [CHF, HTN, DM, Vascular=MI/PAD/Aortic Plaque, Age if 65-74, or Male] Above score calculated as 2 points each if present [Age > 75, or Stroke/TIA/TE]   Patient Vitals for the past 24 hrs:  BP Temp Temp src Pulse Resp SpO2 Height Weight  11/21/16 2230 (!) 124/91 - - 86 (!) 23 - - -  11/21/16 2215 113/61 - - 82 16 - - -  11/21/16 2145 (!) 117/98 - - 77 19 - - -  11/21/16 2130 118/73 - - 71 13 - - -  11/21/16 2115 (!) 110/55 - - (!) 36 16 - - -  11/21/16 2100 117/73 - - (!) 47 (!) 22 - - -  11/21/16 2045 107/74 - - (!) 54 18 - - -  11/21/16 2030 (!) 101/57 - - (!) 30 15 - - -  11/21/16 2015 (!) 89/66 - - 89 15 - - -  11/21/16 2007 96/62 - - (!) 58 (!) 24 - - -  11/21/16 2000 97/63 - - 91 17 - - -  11/21/16 1948 (!) 121/101 97.8 F (36.6 C) Oral 75 16 99 % - -  11/21/16 1933 - - - - - - 5\' 9"  (1.753 m) 77.1 kg (170 lb)    9:46 PM Reevaluation with update and discussion. After initial assessment and treatment, an updated evaluation reveals cardiac monitor has become more regular in the last 30 minutes.  Patient feels well at this time.  Currently cardiac monitor shows  normal sinus rhythm with frequent PVCs, occasionally in a pattern of bigeminy.  Chest x-ray not yet obtained.  Will give patient oral fluid challenge, then ambulate on cardiac monitor, then decide on disposition. Cyan Moultrie L    10:31 PM-patient is calm and comfortable.  Findings discussed with patient and family members, all questions answered   CRITICAL CARE Performed by: Richarda Blade Total critical care time: 35 minutes Critical care time was exclusive of separately billable procedures and treating other patients. Critical care was necessary to treat or prevent imminent or life-threatening deterioration. Critical care was time spent personally by me on the following activities: development of treatment plan with patient and/or surrogate as well as nursing, discussions with consultants, evaluation of patient's response to treatment, examination of patient, obtaining history from patient or surrogate, ordering and performing treatments and interventions, ordering and review of laboratory studies, ordering and review of radiographic studies, pulse oximetry and re-evaluation of patient's condition.   Final Clinical Impressions(s) / ED Diagnoses   Final diagnoses:  Atrial fibrillation with RVR ([place])    Episode of atrial fibrillation likely associated with being in hot environment, and dehydration.  Patient improved with IV fluids and spontaneously converted to normal sinus rhythm.  Doubt ACS, PE or pneumonia.  He has an elevated chads score, and may need to be anticoagulated if he continues to be in atrial fibrillation.  At this point he is stable for discharge with outpatient evaluation, by his PCP.  Nursing Notes Reviewed/ Care Coordinated Applicable Imaging Reviewed Interpretation of Laboratory Data incorporated into ED treatment  The patient appears reasonably screened and/or stabilized for discharge and I doubt any other medical condition or other [facility] requiring further screening,  evaluation, or treatment in the ED at this time prior to discharge.  Plan: Home Medications-continue usual medications; Home Treatments-rest, fluids; return here if the recommended treatment, does not improve the symptoms; Recommended follow up-PCP or cardiology checkup in 1 week for monitoring and management  New Prescriptions New Prescriptions   No medications on file     Daleen Bo, MD 11/21/16 2237

## 2016-11-21 NOTE — ED Notes (Signed)
Patient O2 was at 86%while ambulating. Patients heart rate is at 113 while ambulating.

## 2016-11-21 NOTE — Discharge Instructions (Signed)
Your heart rate was transiently in a rhythm called atrial fibrillation, with a rapid response.  Your heart rate converted to normal sinus rhythm, when we gave you IV fluids.  It is important to stay out of heat, and try to drink more water each day.  A good goal will be to drink 3 or 4 glasses of water more than you usually drink each day.

## 2016-11-21 NOTE — ED Triage Notes (Signed)
Pt presents to the ed for complaints of chest pain and shortness of breath, states that the center of his chest just feels funny. He went to a walk in clinic complaining of dizziness and palpitations they sent him here, hr 150 in triage. Pt has a AAA that he sees a doctor for.

## 2016-11-23 ENCOUNTER — Other Ambulatory Visit: Payer: Self-pay | Admitting: Cardiology

## 2016-11-23 DIAGNOSIS — I10 Essential (primary) hypertension: Secondary | ICD-10-CM | POA: Diagnosis not present

## 2016-11-23 DIAGNOSIS — I714 Abdominal aortic aneurysm, without rupture, unspecified: Secondary | ICD-10-CM

## 2016-11-23 DIAGNOSIS — I480 Paroxysmal atrial fibrillation: Secondary | ICD-10-CM | POA: Diagnosis not present

## 2016-11-23 DIAGNOSIS — Z87891 Personal history of nicotine dependence: Secondary | ICD-10-CM | POA: Diagnosis not present

## 2016-11-28 ENCOUNTER — Encounter: Payer: Self-pay | Admitting: Radiology

## 2016-11-29 ENCOUNTER — Ambulatory Visit
Admission: RE | Admit: 2016-11-29 | Discharge: 2016-11-29 | Disposition: A | Discharge status: Home / Self Care | Payer: Medicare Other | Source: Ambulatory Visit | Attending: Cardiology | Admitting: Cardiology

## 2016-11-29 DIAGNOSIS — I714 Abdominal aortic aneurysm, without rupture, unspecified: Secondary | ICD-10-CM

## 2016-11-29 MED ORDER — IOPAMIDOL (ISOVUE-370) INJECTION 76%
100.0000 mL | Freq: Once | INTRAVENOUS | Status: AC | PRN
  Administered 2016-11-29: 100 mL via INTRAVENOUS

## 2016-12-27 DIAGNOSIS — I714 Abdominal aortic aneurysm, without rupture: Secondary | ICD-10-CM | POA: Diagnosis not present

## 2016-12-27 DIAGNOSIS — I10 Essential (primary) hypertension: Secondary | ICD-10-CM | POA: Diagnosis not present

## 2016-12-27 DIAGNOSIS — I480 Paroxysmal atrial fibrillation: Secondary | ICD-10-CM | POA: Diagnosis not present

## 2017-01-15 DIAGNOSIS — M1611 Unilateral primary osteoarthritis, right hip: Secondary | ICD-10-CM | POA: Diagnosis not present

## 2017-01-15 DIAGNOSIS — I714 Abdominal aortic aneurysm, without rupture: Secondary | ICD-10-CM | POA: Diagnosis not present

## 2017-01-15 DIAGNOSIS — Z5181 Encounter for therapeutic drug level monitoring: Secondary | ICD-10-CM | POA: Diagnosis not present

## 2017-01-15 DIAGNOSIS — I739 Peripheral vascular disease, unspecified: Secondary | ICD-10-CM | POA: Diagnosis not present

## 2017-01-15 DIAGNOSIS — L570 Actinic keratosis: Secondary | ICD-10-CM | POA: Diagnosis not present

## 2017-01-15 DIAGNOSIS — F17210 Nicotine dependence, cigarettes, uncomplicated: Secondary | ICD-10-CM | POA: Diagnosis not present

## 2017-01-15 DIAGNOSIS — J449 Chronic obstructive pulmonary disease, unspecified: Secondary | ICD-10-CM | POA: Diagnosis not present

## 2017-01-15 DIAGNOSIS — I10 Essential (primary) hypertension: Secondary | ICD-10-CM | POA: Diagnosis not present

## 2017-01-15 DIAGNOSIS — M1612 Unilateral primary osteoarthritis, left hip: Secondary | ICD-10-CM | POA: Diagnosis not present

## 2017-01-15 DIAGNOSIS — N400 Enlarged prostate without lower urinary tract symptoms: Secondary | ICD-10-CM | POA: Diagnosis not present

## 2017-03-05 DIAGNOSIS — I4891 Unspecified atrial fibrillation: Secondary | ICD-10-CM

## 2017-03-05 HISTORY — DX: Unspecified atrial fibrillation: I48.91

## 2017-03-08 ENCOUNTER — Encounter (HOSPITAL_COMMUNITY): Payer: Self-pay | Admitting: Emergency Medicine

## 2017-03-08 ENCOUNTER — Emergency Department (HOSPITAL_COMMUNITY)
Admission: EM | Admit: 2017-03-08 | Discharge: 2017-03-08 | Disposition: A | Discharge status: Home / Self Care | Payer: Medicare Other | Attending: Emergency Medicine | Admitting: Emergency Medicine

## 2017-03-08 ENCOUNTER — Emergency Department (HOSPITAL_COMMUNITY): Payer: Medicare Other

## 2017-03-08 DIAGNOSIS — F17200 Nicotine dependence, unspecified, uncomplicated: Secondary | ICD-10-CM | POA: Insufficient documentation

## 2017-03-08 DIAGNOSIS — Z79899 Other long term (current) drug therapy: Secondary | ICD-10-CM | POA: Insufficient documentation

## 2017-03-08 DIAGNOSIS — I4891 Unspecified atrial fibrillation: Secondary | ICD-10-CM

## 2017-03-08 DIAGNOSIS — R531 Weakness: Secondary | ICD-10-CM | POA: Diagnosis not present

## 2017-03-08 DIAGNOSIS — R42 Dizziness and giddiness: Secondary | ICD-10-CM | POA: Diagnosis not present

## 2017-03-08 DIAGNOSIS — R000 Tachycardia, unspecified: Secondary | ICD-10-CM | POA: Diagnosis not present

## 2017-03-08 DIAGNOSIS — J449 Chronic obstructive pulmonary disease, unspecified: Secondary | ICD-10-CM | POA: Diagnosis not present

## 2017-03-08 DIAGNOSIS — I10 Essential (primary) hypertension: Secondary | ICD-10-CM | POA: Diagnosis not present

## 2017-03-08 DIAGNOSIS — Z7982 Long term (current) use of aspirin: Secondary | ICD-10-CM | POA: Insufficient documentation

## 2017-03-08 DIAGNOSIS — I480 Paroxysmal atrial fibrillation: Secondary | ICD-10-CM | POA: Diagnosis not present

## 2017-03-08 DIAGNOSIS — R079 Chest pain, unspecified: Secondary | ICD-10-CM | POA: Diagnosis not present

## 2017-03-08 DIAGNOSIS — Z7901 Long term (current) use of anticoagulants: Secondary | ICD-10-CM | POA: Diagnosis not present

## 2017-03-08 DIAGNOSIS — R404 Transient alteration of awareness: Secondary | ICD-10-CM | POA: Diagnosis not present

## 2017-03-08 DIAGNOSIS — I714 Abdominal aortic aneurysm, without rupture: Secondary | ICD-10-CM | POA: Diagnosis not present

## 2017-03-08 LAB — MAGNESIUM: Magnesium: 1.5 mg/dL — ABNORMAL LOW (ref 1.7–2.4)

## 2017-03-08 LAB — BASIC METABOLIC PANEL
Anion gap: 7 (ref 5–15)
BUN: 14 mg/dL (ref 6–20)
CALCIUM: 8.1 mg/dL — AB (ref 8.9–10.3)
CO2: 24 mmol/L (ref 22–32)
CREATININE: 1.18 mg/dL (ref 0.61–1.24)
Chloride: 104 mmol/L (ref 101–111)
GFR calc non Af Amer: 53 mL/min — ABNORMAL LOW (ref >60)
Glucose, Bld: 99 mg/dL (ref 65–99)
Potassium: 4.6 mmol/L (ref 3.5–5.1)
SODIUM: 135 mmol/L (ref 135–145)

## 2017-03-08 LAB — CBC
HEMATOCRIT: 45.4 % (ref 39.0–52.0)
Hemoglobin: 14.8 g/dL (ref 13.0–17.0)
MCH: 32 pg (ref 26.0–34.0)
MCHC: 32.6 g/dL (ref 30.0–36.0)
MCV: 98.1 fL (ref 78.0–100.0)
Platelets: 125 10*3/uL — ABNORMAL LOW (ref 150–400)
RBC: 4.63 MIL/uL (ref 4.22–5.81)
RDW: 13.5 % (ref 11.5–15.5)
WBC: 8.1 10*3/uL (ref 4.0–10.5)

## 2017-03-08 LAB — I-STAT TROPONIN, ED: Troponin i, poc: 0.02 ng/mL (ref 0.00–0.08)

## 2017-03-08 MED ORDER — METOPROLOL TARTRATE 25 MG PO TABS
50.0000 mg | ORAL_TABLET | Freq: Two times a day (BID) | ORAL | 0 refills | Status: DC
Start: 2017-03-08 — End: 2017-04-25

## 2017-03-08 MED ORDER — METOPROLOL TARTRATE 5 MG/5ML IV SOLN
5.0000 mg | Freq: Once | INTRAVENOUS | Status: AC
  Administered 2017-03-08: 5 mg via INTRAVENOUS
  Filled 2017-03-08: qty 5

## 2017-03-08 MED ORDER — SODIUM CHLORIDE 0.9 % IV BOLUS (SEPSIS)
500.0000 mL | Freq: Once | INTRAVENOUS | Status: AC
  Administered 2017-03-08: 500 mL via INTRAVENOUS

## 2017-03-08 MED ORDER — MAGNESIUM SULFATE 2 GM/50ML IV SOLN
2.0000 g | Freq: Once | INTRAVENOUS | Status: AC
  Administered 2017-03-08: 2 g via INTRAVENOUS
  Filled 2017-03-08: qty 50

## 2017-03-08 NOTE — ED Provider Notes (Signed)
[facility] DEPT Provider Note   CSN: [id number] Arrival date & time: 03/08/17  0940  History   Chief Complaint Chief Complaint  Patient presents with  . Atrial Fibrillation  . Dizziness   HPI Jeremy Macdonald is a 83 y.o. male.  The patient is an 83 year old male with a past medical history significant for atrial fibrillation (on Xarelto), COPD, AAA, and HTN.  The patient reports acute-onset dizziness that began in the shower ~1.5 hours prior to ED arrival.  He did not lose consciousness or fall.  Due to the persistent nature of his symptoms, called EMS for transportation to the ED. His symptoms are accompanied by chest pain; no current shortness of breath.  The patient had one isolated episode of atrial fibrillation with RVR in June of this year that self resolved with IV fluid administration.  At this time, the patient denies diaphoresis, nausea, vomiting, and abdominal pain.  He has mild dizziness at this time.   The history is provided by the patient, the EMS personnel, the spouse, a relative and medical records. No language interpreter was used.  Atrial Fibrillation  Associated symptoms include chest pain. Pertinent negatives include no abdominal pain and no shortness of breath.  Dizziness  Associated symptoms: chest pain   Associated symptoms: no diarrhea, no nausea, no palpitations, no shortness of breath, no vomiting and no weakness    Past Medical History:  Diagnosis Date  . Abdominal aortic aneurysm (AAA) ([place])   . COPD (chronic obstructive pulmonary disease) ([place])   . Hypertension    There are no active problems to display for this patient.  History reviewed. No pertinent surgical history.   Home Medications    Prior to Admission medications   Medication Sig Start Date End Date Taking? Authorizing Provider  albuterol (PROVENTIL) (2.5 MG/3ML) 0.083% nebulizer solution Take 2.5 mg by nebulization every 6 (six) hours as needed for wheezing or shortness of  breath.    [provider]  aspirin 81 MG chewable tablet Chew 81 mg by mouth daily.    [provider]  benazepril-hydrochlorthiazide (LOTENSIN HCT) 20-25 MG tablet Take 1 tablet by mouth daily.    [provider]  Dutasteride-Tamsulosin HCl (JALYN) 0.5-0.4 MG CAPS Take 1 tablet by mouth daily.    [provider]  meloxicam (MOBIC) 7.5 MG tablet Take 15 mg by mouth daily.    [provider]  metoprolol tartrate (LOPRESSOR) 25 MG tablet Take 25 mg by mouth 2 (two) times daily.    [provider]  rosuvastatin (CRESTOR) 10 MG tablet Take 10 mg by mouth daily.    [provider]   Family History History reviewed. No pertinent family history.  Social History Social History  Substance Use Topics  . Smoking status: Current Every Day Smoker  . Smokeless tobacco: Current User  . Alcohol use No     Allergies   Phenergan [promethazine hcl]  Review of Systems Review of Systems  Constitutional: Negative for chills and fever.  HENT: Negative.   Eyes: Negative for pain and visual disturbance.  Respiratory: Negative for cough, shortness of breath and wheezing.   Cardiovascular: Positive for chest pain. Negative for palpitations.  Gastrointestinal: Negative for abdominal pain, diarrhea, nausea and vomiting.  Genitourinary: Negative for dysuria and hematuria.  Musculoskeletal: Negative for arthralgias and back pain.  Skin: Negative for color change and rash.  Allergic/Immunologic: Negative for immunocompromised state.  Neurological: Positive for dizziness and light-headedness. Negative for seizures, syncope and weakness.  Hematological:  Negative.   Psychiatric/Behavioral: Negative.   All other systems reviewed and are negative.  Physical Exam Updated Vital Signs SpO2 99%   Physical Exam  Constitutional: He is oriented to person, place, and time. He appears well-developed and well-nourished.  HENT:  Head: Normocephalic and  atraumatic.  Dry lips; moist oropharynx  Eyes: Conjunctivae are normal. No scleral icterus.  Neck: Neck supple. No JVD present.  Cardiovascular: Intact distal pulses.   No murmur heard. Atrial fibrillation with RVR; normotensive  Pulmonary/Chest: Effort normal and breath sounds normal. No respiratory distress. He has no wheezes.  Abdominal: Soft. He exhibits no mass. There is no tenderness. There is no guarding.  Musculoskeletal: He exhibits no edema or tenderness.  Neurological: He is alert and oriented to person, place, and time.  Skin: Skin is warm and dry. Capillary refill takes less than 2 seconds.  Psychiatric: He has a normal mood and affect. His behavior is normal. Judgment and thought content normal.  Nursing note and vitals reviewed.  ED Treatments / Results  Labs (all labs ordered are listed, but only abnormal results are displayed) Labs Reviewed - No data to display  EKG  EKG Interpretation None      Radiology No results found.  Procedures Procedures (including critical care time)  Medications Ordered in ED Medications - No data to display   Initial Impression / Assessment and Plan / ED Course  I have reviewed the triage vital signs and the nursing notes.  Pertinent labs & imaging results that were available during my care of the patient were reviewed by me and considered in my medical decision making (see chart for details).     Initial differential diagnosis included arrhythmia, electively disturbance, pneumonia, pleural effusion, sepsis, ACS, and heart failure.  Pertinent labs included a CBC without anemia or leukocytosis; thrombocyte opinion noted. BM P unremarkable. Magnesium decreased at 1.5.  EKG notable for atrial fibrillation with RVR.  Imaging studies included a normal appearing chest x-ray without acute cardiopulmonary abdomen mildly.  The patient was given a 500 mL normal saline bolus as well as 5 mg IV metoprolol. Upon reassessment, he remained in  atrial fibrillation with RVR. I subsequently administered a second 500 mL normal saline bolus as well as IV magnesium for hypomagnesemia.  Upon reassessment, the patient had converted to a normal sinus rhythm and no longer complained of dizziness or chest pain.  Based on the above findings, I suspect the patient spontaneously converted to atrial fibrillation with RVR.  His metoprolol dose may be insufficient to control his atrial fibrillation at this time.  No systemic symptoms to suggest sepsis at present. No urinary symptoms to suggest UTI.  No reported fevers or cough to suggest pneumonia as an underlying etiology of his current episode of A. fib.  I spoke with the patient's cardiologist, Dr. Einar Gip, while the patient remained in the ED under my care.  The cardiologist recommended increasing the patient's metoprolol from 25 mg twice a day to 50 mg twice a day.  I discussed the above results with the patient who verbalized understanding.  Return precautions and follow-up plans discussed.  The patient will call Dr. Irven Shelling office this afternoon to schedule a follow-up appointment.  I provided a prescription for metoprolol at the patient's request. All of the above instructions were reviewed with the patient, his wife, and son, and also written his discharge paperwork.  The patient was discharged in stable condition.  Final Clinical Impressions(s) / ED Diagnoses   Final diagnoses:  Tachycardia  Atrial fibrillation with RVR (HCC)  Hypomagnesemia   New Prescriptions New Prescriptions   No medications on file     Charisse March, MD 03/08/17 1635

## 2017-03-08 NOTE — Discharge Instructions (Signed)
Increase your dose of Metoprolol from 25mg  twice daily to 50mg  twice daily.  Follow-up with Dr. Einar Gip.  His office is expecting your call to schedule follow-up.  Return to the ED for return of symptoms.

## 2017-03-08 NOTE — ED Triage Notes (Signed)
Pt arrived via EMS from home with wife. Pt states he was in shower and got dizzy this morning about 7 am. Reports dizziness/nausea for 2-3 hours. Denies syncope. EMS gave 324 ASA and 350 cc fluid. EMS vitals Afib RVR 170, 126/68, 99% RA, CBG 133. Upon arrival, Pt AOx4, no pain.

## 2017-03-08 NOTE — ED Provider Notes (Signed)
I saw and evaluated the patient, reviewed the resident's note and I agree with the findings and plan.   EKG Interpretation  Date/Time:  Thursday March 08 2017 09:50:31 EDT Ventricular Rate:  144 PR Interval:    QRS Duration: 103 QT Interval:  301 QTC Calculation: 466 R Axis:   -21 Text Interpretation:  Atrial fibrillation with rapid V-rate Ventricular premature complex Borderline left axis deviation Repolarization abnormality, prob rate related Confirmed by Lacretia Leigh ([id number]) on 03/08/2017 10:08:35 AM     83-YEAR-OLD MALE PRESENTS WITH acute onset of dizziness and called EMS was found to be in A. Fib with RVR. Patient takes anticoagulants daily as well as beta blockers for this. On arrival here, he is hemodynamically stable. He is in A. Fib with RVR.We'll IV hydrate as well as give beta blocker and cardiovert if needed   Lacretia Leigh, MD 03/08/17 1010

## 2017-03-08 NOTE — ED Notes (Signed)
Pt noted to have a 5 beat run of v-tach. Shown to EDP. Pt asymptomatic. Vitals otherwise stable.

## 2017-03-12 DIAGNOSIS — R079 Chest pain, unspecified: Secondary | ICD-10-CM | POA: Diagnosis not present

## 2017-03-18 DIAGNOSIS — I480 Paroxysmal atrial fibrillation: Secondary | ICD-10-CM | POA: Diagnosis present

## 2017-03-18 DIAGNOSIS — R9439 Abnormal result of other cardiovascular function study: Secondary | ICD-10-CM | POA: Diagnosis present

## 2017-03-18 NOTE — H&P (Signed)
OFFICE VISIT NOTES COPIED TO EPIC FOR DOCUMENTATION  . History of Present Illness Laverda Page MD; 03/10/2017 7:38 PM) Patient words: Hospital f/u afib, pt is c/o dizziness; Last office visit 12/27/16.  The patient is a 83 year old male who presents for a Follow-up for Abdominal aortic aneurysm.  Additional reasons for visit:  Follow-up for Atrial fibrillation is described as the following: He was seen in the emergency room today on 03/08/2017, presented with dizziness. He was found to be in atrial fibrillation with rapid ventricular response, received IV hydration and he spontaneously converted to sinus rhythm and was discharged home. He did have an episode of nonsustained VT while in the emergency room. This was a 6 beat VT. He had another episode of atrial fibrillation with RVR in June 2018. This time patient also noticed chest discomfort which he did not mention in the emergency room. Presently doing well, has mild chronic dizziness. Accompanied by his wife at the bedside.  Patient with known abdominal aortic aneurysm extending to the iliac vessels diagnosed in 2016, paroxysmal atrial fibrillation, mild asymptomatic bilateral carotid artery stenosis and PAD with mild symptoms of claudication, hypertension and hyperlipidemia. Patient does smoke cigarettes on occasion. He has chronic mild dyspnea on exertion but denies any PND or orthopnea. He attributed this to chronic smoking and COPD. Although 83 years of age, he is been doing well and active. He remains asymptomatic from carotid stensosis.   Problem List/Past Medical (April Harrington; 03/10/2017 7:38 PM) Arthritis (M19.90)  BPH (benign prostatic hyperplasia) (N40.0)  Benign essential hypertension (I10)  History of tobacco use disorder (I96.789)  Started back smoking in 09/2015 and has cut back to 2 cigarettes a day. Hyperlipidemia, mild (E78.5)  Atherosclerosis of native artery of both lower extremities with intermittent  claudication (I70.213)  Lower extremity arterial duplex 06/03/2015: No hemodynamically significant stenoses are identified in the right lower extremity arterial system. No hemodynamically significant stenoses are identified in the left lower extremity arterial system. This exam reveals moderately decreased perfusion of the right lower extremity with RABI 0.72 and mildly decreased perfusion of the left lower extremity with ABI 0.83 noted at the post tibial artery level. Asymptomatic carotid artery stenosis, bilateral (F81.01)  Carotid artery duplex 02/15/2016: Stenosis in the right internal carotid artery (16-49%). Stenosis in the left internal carotid artery (16-49%). There is moderate mixed plaque bilateral carotid arteries. Antegrade vertebral artery flow. Follow up in one year is appropriate if clinically indicated. Compared to the study done on 06/01/2015, no significant change, right ICA stenosis appears to be less. Shortness of breath (R06.02)  Labwork  09/07/2016: Normal H&H, MCV 99.7, MCH 33.1, platelets 99, CBC otherwise normal. Creatinine 1.08, potassium 4.4, CMP normal. Cholesterol 150, triglycerides 103, HDL 75, LDL 54. Labs 05/08/2016: Serum glucose 106 mg, creatinine 0.96, BUN 12, eGFR 74 mL, CMP normal. HB 17.0/HCT 50.2, platelets 120. Total cholesterol 148, triglycerides 70, HDL 79, LDL 55. A1c 5.4%. Labs 09/07/2015: BUN 18, serum creatinine in 1.14, eGFR 61 mL, CMP otherwise normal. 04/26/2015: creatinine 1.1, eGFR 60 Paroxysmal A-fib (I48.0)  EKG at [facility] 11/21/2016: Atrial fibrillation with RVR and 152 bpm, normal axis, poor R-wave progression, cannot exclude anterior infarct old. CHA2DS2-VASc Score is 4 with yearly risk of stroke of 4 %. HAS-Bled score is2 and estimated major bleeding in one year is 1.8-3.2 % Aneurysm of infrarenal abdominal aorta (I71.4) [04/02/2009]: CT of the abdomen 11/29/2016: Heterogeneous plaque, fusiform abdominal aortic aneurysm distal to the  origin of IMA, 4.8  cm, enlarged compared to 4.4 cm from 04/26/2015. Circumferential adherent mural thrombus. 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 Mild stenosis with calcification left renal artery. High-grade stenosis at the origin of the right IIA, right external iliac artery high-grade stenosis. Severe calcification of the femoral arteries. Coronary calcification. Abdominal aortic duplex 08/23/2016: Moderate dilatation of the abdominal aorta is noted in the mid and distal aorta measuring 4.11 x 4.18 x 4.44 cm is seen. Diffuse plaque noted in the proximal aorta. Focal plaque noted in the mid and distal aorta. Compared to the study done on 02/15/2016, no significant change.  Allergies (April Harrington; Mar 26, 2017 3:53 PM) Phenergan *ANTIHISTAMINES*  confusion  Family History (April Harrington; Mar 26, 2017 3:53 PM) Mother  Deceased. at age 35 from unknown medical causes; no heart attacks or strokes or other known cardiovascular conditions Father  Deceased. at age 53 from heart attack; no other heart attacks prior to this event, no strokes or other cardiovascular conditions Sister 1  Deceased. at age 42 from cancer; 3 yrs older no known cardiovascular conditions Brother 1  Deceased. at age 59 from cancer; no cardiovascular conditions; 6 yrs older  Social History (April Harrington; 2017-03-26 3:53 PM) Marital status  Married. Living Situation  Lives with spouse. Number of Children  3. 1-Deceased; All Adopted Alcohol Use  Occasional alcohol use. Current tobacco use  Light tobacco smoker.  Past Surgical History (April Harrington; 03/26/17 3:53 PM) Appendectomy [2008]: Cataract Extraction-Bilateral [2013]:  Medication History (April Harrington; 03/26/17 4:07 PM) Xarelto ('20MG'$  Tablet, 1 (one) Tablet Oral daily, Taken starting 02/19/2017) Active. (stop ASA and only take Meloxicam as needed) Jalyn (0.5-0.'4MG'$  Capsule, 1 Oral daily)  Active. Benazepril-Hydrochlorothiazide (20-'25MG'$  Tablet, 1 Oral daily) Active. Atorvastatin Calcium ('10MG'$  Tablet, 1 (one) Tablet Tablet Tablet Oral daily, Taken starting 05/30/2016) Active. Acetaminophen ('325MG'$  Tablet, '650mg'$  Oral every 6 hours as needed for headache) Active. Metoprolol Tartrate ('25MG'$  Tablet, 2 tabs Oral two times daily, Taken starting 06/12/2016) Active. Medications Reconciled (verbally with pt; discharge summary present)  Diagnostic Studies History Laverda Page, MD; 03/10/2017 7:40 PM) Chest X-ray [03-26-17]: The heart size and mediastinal contours are within normal limits. Atherosclerosis of thoracic aorta is noted. No pneumothorax or pleural effusion is noted. Both lungs are clear. The visualized skeletal structures are unremarkable. CT Scan of Abdomen [11/29/2016]: Heterogeneous plaque, fusiform abdominal aortic aneurysm distal to the origin of IMA, 4.8 cm, enlarged compared to 4.4 cm from 04/26/2015. Circumferential adherent mural thrombus. 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 Mild stenosis with calcification left renal artery. High-grade stenosis at the origin of the right IIA, right external iliac artery high-grade stenosis. Severe calcification of the femoral arteries. Coronary calcification. Echocardiogram [06/01/2015]: 1. Left ventricle cavity is normal in size. Mild concentric hypertrophy of the left ventricle. Normal global wall motion. Doppler evidence of grade I (impaired) diastolic dysfunction. Diastolic dysfunction findings suggests elevated LA/LV end diastolic pressure. Calculated EF 52%. 2. Aortic valve not well visualized. There is moderate calcification of the aortic valve, mild to moderately restricted aortic valve movement. It appears to be tricuspid. Peak gradient 16, mean gradient 7.2 mmHg, calculated aortic valve area 1.84 cm, mild aortic stenosis. Mild aortic regurgitation. 3. Mild calcification of the  mitral valve annulus. Mild mitral regurgitation. 4. IVC is dilated with blunted respiratory response. Suggests elevated right heart pressure.    Review of Systems Laverda Page MD; Mar 26, 2017 5:08 PM) General Present- Feeling well. Not Present- Anorexia,  Fatigue and Fever. Respiratory Present- Difficulty Breathing on Exertion. Not Present- Cough, Decreased Exercise Tolerance and Dyspnea. Cardiovascular Present- Claudications (very mild leg cramps on extremes of exertion). Not Present- Chest Pain, Edema, Orthopnea, Paroxysmal Nocturnal Dyspnea and Shortness of Breath. Gastrointestinal Not Present- Change in Bowel Habits, Constipation and Nausea. Neurological Present- Dizziness. Not Present- Focal Neurological Symptoms. Endocrine Not Present- Appetite Changes, Cold Intolerance and Heat Intolerance. Hematology Not Present- Anemia, Petechiae and Prolonged Bleeding. All other systems negative  Vitals (April Harrington; 03/08/2017 4:10 PM) 03/08/2017 4:09 PM Pulse: 93 (Regular)  P.OX: 95% (Room air) BP: 151/84 (Standing, Right Arm, Standard)    03/08/2017 4:07 PM Pulse: 63 (Regular)  P.OX: 95% (Room air) BP: 151/81 (Sitting, Right Arm, Standard)    03/08/2017 3:56 PM Weight: 173.19 lb Height: 69in Body Surface Area: 1.94 m Body Mass Index: 25.58 kg/m  Pulse: 56 (Regular)  P.OX: 97% (Room air) BP: 136/74 (Supine, Right Arm, Standard)       Physical Exam Laverda Page, MD; 03/08/2017 6:5 PM) General Mental Status-Alert. General Appearance-Cooperative, Appears stated age, Not in acute distress. Orientation-Oriented X3. Build & Nutrition-Well built and Well nourished.  Head and Neck Thyroid Gland Characteristics - no palpable nodules, no palpable enlargement.  Chest and Lung Exam Inspection Shape - Barrel-shaped and Increased AP diameter. Palpation Tender - No chest wall tenderness. Auscultation Breath sounds - Decreased - Both Lung  Fields(distant). Adventitious sounds - Inspiratory & expiratory crackles - Both Lung Fields.  Cardiovascular Inspection Jugular vein - Right - No Distention. Auscultation Heart Sounds - S1 WNL, S2 WNL and No gallop present. Murmurs & Other Heart Sounds: Murmur - Location - Aortic Area. Grade - I/VI. Radiation - Carotids.  Abdomen Palpation/Percussion Normal exam - Non Tender and No hepatosplenomegaly. Auscultation Normal exam - Bowel sounds normal.  Peripheral Vascular Lower Extremity Inspection - Left - No Pigmentation, No Varicose veins. Right - No Pigmentation, No Varicose veins. Palpation - Edema - Bilateral - No edema. Femoral pulse - Left - Feeble. Right - Absent. Popliteal pulse - Bilateral - Absent. Dorsalis pedis pulse - Bilateral - Absent. Posterior tibial pulse - Bilateral - Absent. Carotid arteries - Left-No Carotid bruit. Carotid arteries - Right-No Carotid bruit. Abdomen-Prominent abdominal aortic pulsation, No epigastric bruit.  Neurologic Motor-Grossly intact without any focal deficits.  Musculoskeletal - Did not examine.    Assessment & Plan  Chest pain, cardiac (R07.9) Story: Lexiscan myoview stress test 05/24/2015: 1. The resting electrocardiogram demonstrated normal sinus rhythm, normal resting conduction and no resting arrhythmias. Stress EKG is non-diagnostic for ischemia as it a pharmacologic stress using Lexiscan. Stress symptoms included dyspnea. 2. There is a moderate sized area of severe ischemia in the basal inferoseptal, basal inferior, basal inferolateral, mid inferior, mid inferolateral, apical inferior, apical lateral, apical and lateral myocardial wall(s). The left ventricular ejection fraction was calculated or visually estimated to be 42%. This is a high risk study. Consider further work-up  Paroxysmal A-fib (I48.0) Story: CHA2DS2-VASc Score is 4 with yearly risk of stroke of 4 %. HAS-Bled score is2 and estimated major bleeding in one  year is 1.8-3.2 % Impression: EKG 03/08/2017 at 9:50 AM: Atrial fibrillation with RVR, PVC, inferior and lateral ST depression, secondary ST-T wave changes versus ischemia. Repeat EKG at 11:38 AM: Normal sinus rhythm, poor R-wave progression. ST changes resolved.  EKG at [facility] 11/21/2016: Atrial fibrillation with RVR and 152 bpm, normal axis, poor R-wave progression, cannot exclude anterior infarct old. Current Plans Changed Metoprolol Tartrate '50MG'$ , 1 (  one) Tablet two times daily, #180, 03/08/2017, Ref. x2. Aneurysm of infrarenal abdominal aorta (I71.4) Story: CT of the abdomen 11/29/2016: Heterogeneous plaque, fusiform abdominal aortic aneurysm distal to the origin of IMA, 4.8 cm, enlarged compared to 4.4 cm from 04/26/2015. Circumferential adherent mural thrombus. 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 Mild stenosis with calcification left renal artery. High-grade stenosis at the origin of the right IIA, right external iliac artery high-grade stenosis. Severe calcification of the femoral arteries. Coronary calcification.  Abdominal aortic duplex 08/23/2016: Moderate dilatation of the abdominal aorta is noted in the mid and distal aorta measuring 4.11 x 4.18 x 4.44 cm is seen. Diffuse plaque noted in the proximal aorta. Focal plaque noted in the mid and distal aorta. Compared to the study done on 02/15/2016, no significant change. Future Plans 08/14/2017: Duplex scan of aorta, inferior vena cava, iliac vasculature, or bypass grafts; complete study (16109) - one time Benign essential hypertension (I10) Impression: EKG 11/23/2016: Normal sinus rhythm at the rate of 56 bpm, left atrial enlargement, poor R-wave progression, cannot exclude anterior infarct old. Frequent PAC's. No evidence of ischemia. No significant change from EKG 08/25/2015, frequent PAC's are new. Labwork Story: 03/13/2017: Creatinine 1.15, EGFR 56/65, potassium 4.7, BMP normal.  CBC  normal.  INR 1.2, prothrombin time 11.9.  Labs 03/08/2017: Potassium 4.6, BUN 14, creatinine 1.18, eGFR 53 mL. HB 14.8/HCT 45.4, nitrates on 1125. Normal indicis. Magnesium 1.5.  09/07/2016: Normal H&H, MCV 99.7, MCH 33.1, platelets 99, CBC otherwise normal. Creatinine 1.08, potassium 4.4, CMP normal. Cholesterol 150, triglycerides 103, HDL 75, LDL 54.  Labs 05/08/2016: Serum glucose 106 mg, creatinine 0.96, BUN 12, eGFR 74 mL, CMP normal. HB 17.0/HCT 50.2, platelets 120. Total cholesterol 148, triglycerides 70, HDL 79, LDL 55. A1c 5.4%.  Labs 09/07/2015: BUN 18, serum creatinine in 1.14, eGFR 61 mL, CMP otherwise normal.  04/26/2015: creatinine 1.1, eGFR 60  Note:-  Recommendations:  Patient was seen in the emergency room today when he presented with atrial flutter fibrillation with rapid ventricular response. He did have significant ST changes to suggest ischemia, ST he has had abnormal stress test that was medically managed as he was asymptomatic and also due to his advanced age. However today when he presented with atrial fibrillation with RVR, did have chest discomfort, and also had an episode of 6 beat NSVT. Hence I'm very concerned given markedly abnormal stress test showing severe ischemia, would recommend cardiac catheterization. I had a very lengthy discussion with the patient and his wife regarding risks associated with catheterization especially in view of his advanced age. After long discussion, they would like to proceed with cardiac catheterization. His blood pressure is elevated today, I will increase the dose of metoprolol from 25 mg b.i.d. to 50 mg p.o. b.i.d. which I had recommended while he was in the emergency room when he was sent home. I was not aware of the NSVT. His labs from the hospital with reviewed and updated. Patient is also aware that he may need staged procedure due to advanced age and renal insufficiency.  CC Dr. Yaakov Guthrie.    Signed by Laverda Page, MD (03/10/2017 7:41 PM)

## 2017-03-19 ENCOUNTER — Ambulatory Visit (HOSPITAL_COMMUNITY)
Admission: RE | Admit: 2017-03-19 | Discharge: 2017-03-19 | Disposition: A | Discharge status: Home / Self Care | Payer: Medicare Other | Source: Ambulatory Visit | Attending: Cardiology | Admitting: Cardiology

## 2017-03-19 ENCOUNTER — Ambulatory Visit (HOSPITAL_COMMUNITY)
Admission: RE | Disposition: A | Discharge status: Home / Self Care | Payer: Self-pay | Source: Ambulatory Visit | Attending: Cardiology

## 2017-03-19 DIAGNOSIS — R9439 Abnormal result of other cardiovascular function study: Secondary | ICD-10-CM | POA: Diagnosis present

## 2017-03-19 DIAGNOSIS — I209 Angina pectoris, unspecified: Secondary | ICD-10-CM | POA: Diagnosis not present

## 2017-03-19 DIAGNOSIS — Z7901 Long term (current) use of anticoagulants: Secondary | ICD-10-CM | POA: Diagnosis not present

## 2017-03-19 DIAGNOSIS — Z9841 Cataract extraction status, right eye: Secondary | ICD-10-CM | POA: Diagnosis not present

## 2017-03-19 DIAGNOSIS — Z809 Family history of malignant neoplasm, unspecified: Secondary | ICD-10-CM | POA: Insufficient documentation

## 2017-03-19 DIAGNOSIS — I714 Abdominal aortic aneurysm, without rupture: Secondary | ICD-10-CM | POA: Insufficient documentation

## 2017-03-19 DIAGNOSIS — Z79899 Other long term (current) drug therapy: Secondary | ICD-10-CM | POA: Insufficient documentation

## 2017-03-19 DIAGNOSIS — M1990 Unspecified osteoarthritis, unspecified site: Secondary | ICD-10-CM | POA: Diagnosis not present

## 2017-03-19 DIAGNOSIS — I493 Ventricular premature depolarization: Secondary | ICD-10-CM | POA: Diagnosis not present

## 2017-03-19 DIAGNOSIS — I739 Peripheral vascular disease, unspecified: Secondary | ICD-10-CM | POA: Diagnosis not present

## 2017-03-19 DIAGNOSIS — E785 Hyperlipidemia, unspecified: Secondary | ICD-10-CM | POA: Insufficient documentation

## 2017-03-19 DIAGNOSIS — Z9842 Cataract extraction status, left eye: Secondary | ICD-10-CM | POA: Diagnosis not present

## 2017-03-19 DIAGNOSIS — I119 Hypertensive heart disease without heart failure: Secondary | ICD-10-CM | POA: Insufficient documentation

## 2017-03-19 DIAGNOSIS — I080 Rheumatic disorders of both mitral and aortic valves: Secondary | ICD-10-CM | POA: Diagnosis not present

## 2017-03-19 DIAGNOSIS — F17210 Nicotine dependence, cigarettes, uncomplicated: Secondary | ICD-10-CM | POA: Diagnosis not present

## 2017-03-19 DIAGNOSIS — Z888 Allergy status to other drugs, medicaments and biological substances status: Secondary | ICD-10-CM | POA: Diagnosis not present

## 2017-03-19 DIAGNOSIS — I472 Ventricular tachycardia: Secondary | ICD-10-CM | POA: Diagnosis not present

## 2017-03-19 DIAGNOSIS — I6523 Occlusion and stenosis of bilateral carotid arteries: Secondary | ICD-10-CM | POA: Diagnosis not present

## 2017-03-19 DIAGNOSIS — R0600 Dyspnea, unspecified: Secondary | ICD-10-CM | POA: Diagnosis not present

## 2017-03-19 DIAGNOSIS — I25119 Atherosclerotic heart disease of native coronary artery with unspecified angina pectoris: Secondary | ICD-10-CM | POA: Insufficient documentation

## 2017-03-19 DIAGNOSIS — N400 Enlarged prostate without lower urinary tract symptoms: Secondary | ICD-10-CM | POA: Insufficient documentation

## 2017-03-19 DIAGNOSIS — I480 Paroxysmal atrial fibrillation: Secondary | ICD-10-CM | POA: Diagnosis not present

## 2017-03-19 DIAGNOSIS — J449 Chronic obstructive pulmonary disease, unspecified: Secondary | ICD-10-CM | POA: Insufficient documentation

## 2017-03-19 DIAGNOSIS — Z8249 Family history of ischemic heart disease and other diseases of the circulatory system: Secondary | ICD-10-CM | POA: Diagnosis not present

## 2017-03-19 DIAGNOSIS — I10 Essential (primary) hypertension: Secondary | ICD-10-CM | POA: Diagnosis not present

## 2017-03-19 HISTORY — PX: LEFT HEART CATH AND CORONARY ANGIOGRAPHY: CATH118249

## 2017-03-19 SURGERY — LEFT HEART CATH AND CORONARY ANGIOGRAPHY
Anesthesia: LOCAL

## 2017-03-19 MED ORDER — FENTANYL CITRATE (PF) 100 MCG/2ML IJ SOLN
INTRAMUSCULAR | Status: AC
  Filled 2017-03-19: qty 2

## 2017-03-19 MED ORDER — MIDAZOLAM HCL 2 MG/2ML IJ SOLN
INTRAMUSCULAR | Status: AC
Start: 2017-03-19 — End: 2017-03-19
  Filled 2017-03-19: qty 2

## 2017-03-19 MED ORDER — SODIUM CHLORIDE 0.9% FLUSH: 3.0000 mL | INTRAVENOUS | Status: DC | PRN

## 2017-03-19 MED ORDER — ASPIRIN 81 MG PO CHEW
CHEWABLE_TABLET | ORAL | Status: AC
  Filled 2017-03-19: qty 1

## 2017-03-19 MED ORDER — SODIUM CHLORIDE 0.9% FLUSH: 3.0000 mL | Freq: Two times a day (BID) | INTRAVENOUS | Status: DC

## 2017-03-19 MED ORDER — SODIUM CHLORIDE 0.9 % IV SOLN: 250.0000 mL | INTRAVENOUS | Status: DC | PRN

## 2017-03-19 MED ORDER — LIDOCAINE HCL 2 % IJ SOLN
INTRAMUSCULAR | Status: AC
  Filled 2017-03-19: qty 20

## 2017-03-19 MED ORDER — SODIUM CHLORIDE 0.9 % WEIGHT BASED INFUSION: 1.0000 mL/kg/h | INTRAVENOUS | Status: DC

## 2017-03-19 MED ORDER — HEPARIN (PORCINE) IN NACL 2-0.9 UNIT/ML-% IJ SOLN
INTRAMUSCULAR | Status: AC
Start: 2017-03-19 — End: 2017-03-19
  Filled 2017-03-19: qty 1000

## 2017-03-19 MED ORDER — RIVAROXABAN 15 MG PO TABS: 15.0000 mg | ORAL_TABLET | Freq: Every day | ORAL | Status: DC

## 2017-03-19 MED ORDER — HEPARIN SODIUM (PORCINE) 1000 UNIT/ML IJ SOLN
INTRAMUSCULAR | Status: DC | PRN
  Administered 2017-03-19: 4000 [IU] via INTRAVENOUS

## 2017-03-19 MED ORDER — IOPAMIDOL (ISOVUE-370) INJECTION 76%
INTRAVENOUS | Status: DC | PRN
  Administered 2017-03-19: 60 mL via INTRAVENOUS

## 2017-03-19 MED ORDER — HEPARIN (PORCINE) IN NACL 2-0.9 UNIT/ML-% IJ SOLN
INTRAMUSCULAR | Status: AC | PRN
  Administered 2017-03-19: 1000 mL

## 2017-03-19 MED ORDER — LIDOCAINE HCL (PF) 2 % IJ SOLN
INTRAMUSCULAR | Status: DC | PRN
  Administered 2017-03-19: 2 mL

## 2017-03-19 MED ORDER — VERAPAMIL HCL 2.5 MG/ML IV SOLN
INTRAVENOUS | Status: AC
  Filled 2017-03-19: qty 2

## 2017-03-19 MED ORDER — HEPARIN SODIUM (PORCINE) 1000 UNIT/ML IJ SOLN
INTRAMUSCULAR | Status: AC
  Filled 2017-03-19: qty 1

## 2017-03-19 MED ORDER — IOPAMIDOL (ISOVUE-370) INJECTION 76%
INTRAVENOUS | Status: AC
  Filled 2017-03-19: qty 100

## 2017-03-19 MED ORDER — FENTANYL CITRATE (PF) 100 MCG/2ML IJ SOLN
INTRAMUSCULAR | Status: DC | PRN
Start: 2017-03-19 — End: 2017-03-19
  Administered 2017-03-19: 25 ug via INTRAVENOUS

## 2017-03-19 MED ORDER — SODIUM CHLORIDE 0.9 % WEIGHT BASED INFUSION
3.0000 mL/kg/h | INTRAVENOUS | Status: AC
  Administered 2017-03-19: 3 mL/kg/h via INTRAVENOUS

## 2017-03-19 MED ORDER — MIDAZOLAM HCL 2 MG/2ML IJ SOLN
INTRAMUSCULAR | Status: DC | PRN
  Administered 2017-03-19: 2 mg via INTRAVENOUS

## 2017-03-19 MED ORDER — ASPIRIN 81 MG PO CHEW
81.0000 mg | CHEWABLE_TABLET | ORAL | Status: AC
  Administered 2017-03-19: 81 mg via ORAL

## 2017-03-19 SURGICAL SUPPLY — 10 items
CATH OPTITORQUE TIG 4.0 5F (CATHETERS) ×1 IMPLANT
DEVICE RAD COMP TR BAND LRG (VASCULAR PRODUCTS) ×1 IMPLANT
GLIDESHEATH SLEND A-KIT 6F 20G (SHEATH) ×1 IMPLANT
GUIDEWIRE ANGLED .035X150CM (WIRE) ×1 IMPLANT
GUIDEWIRE INQWIRE 1.5J.035X260 (WIRE) IMPLANT
INQWIRE 1.5J .035X260CM (WIRE) ×2
KIT HEART LEFT (KITS) ×2 IMPLANT
PACK CARDIAC CATHETERIZATION (CUSTOM PROCEDURE TRAY) ×2 IMPLANT
TRANSDUCER W/STOPCOCK (MISCELLANEOUS) ×2 IMPLANT
TUBING CIL FLEX 10 FLL-RA (TUBING) ×2 IMPLANT

## 2017-03-19 NOTE — Interval H&P Note (Signed)
History and Physical Interval Note:  03/19/2017 7:31 AM  Jeremy Macdonald  has presented today for surgery, with the diagnosis of abnormal stress test, cp  The various methods of treatment have been discussed with the patient and family. After consideration of risks, benefits and other options for treatment, the patient has consented to  Procedure(s): LEFT HEART CATH AND CORONARY ANGIOGRAPHY (N/A) and possible PCI as a surgical intervention .  The patient's history has been reviewed, patient examined, no change in status, stable for surgery.  I have reviewed the patient's chart and labs.  Questions were answered to the patient's satisfaction.   Symptom Status: Ischemic Symptoms Non-invasive Testing: Indeterminate If no or indeterminate stress test, FFR/iFR results in all diseased vessels: Not done Diabetes Mellitus: No S/P CABG: No Antianginal therapy (number of long-acting drugs): 1 Patient undergoing renal transplant: No Patient undergoing percutaneous valve procedure: No   1 Vessel Disease No proximal LAD involvement, No proximal left dominant LCX involvement  PCI: Not rated  CABG: Not rated Proximal left dominant LCX involvement  PCI: Not rated  CABG: Not rated Proximal LAD involvement  PCI: Not rated  CABG: Not rated  2 Vessel Disease No proximal LAD involvement  PCI: Not rated  CABG: Not rated Proximal LAD involvement  PCI: Not rated  CABG: Not rated  3 Vessel Disease Low disease complexity (e.g., focal stenoses, SYNTAX <=22)  PCI: Not rated  CABG: Not rated Intermediate or high disease complexity (e.g., SYNTAX >=23)  PCI: Not rated  CABG: Not rated  Left Main Disease Isolated LMCA disease: ostial or midshaft  PCI: A (7);  Indication 24  CABG: A (9);  Indication 24 Isolated LMCA disease: bifurcation involvement  PCI: M (5);  Indication 25  CABG: A (9);  Indication 25 LMCA ostial or midshaft, concurrent low disease burden multivessel disease (e.g., 1-2  additional focal stenoses, SYNTAX <=22)  PCI: A (7);  Indication 26  CABG: A (9);  Indication 26 LMCA ostial or midshaft, concurrent intermediate or high disease burden multivessel disease (e.g., 1-2 additional bifurcation stenoses, long stenoses, SYNTAX >=23)  PCI: M (4);  Indication 27  CABG: A (9);  Indication 27 LMCA bifurcation involvement, concurrent low disease burden multivessel disease (e.g., 1-2 additional focal stenoses, SYNTAX <=22)  PCI: M (5);  Indication 28  CABG: A (9);  Indication 28 LMCA bifurcation involvement, concurrent intermediate or high disease burden multivessel disease (e.g., 1-2 additional bifurcation stenoses, long stenoses, SYNTAX >=23)  PCI: R (3);  Indication 29  CABG: A (9);  Indication 29  Notes:  A indicates appropriate. M indicates may be appropriate. R indicates rarely appropriate. Number in parentheses is median score for that indication. Reclassify indicates number of functionally diseased vessels should be decreased given negative FFR/iFR. Re-evaluate the scenario interpreting any FFR/iFR negative vessel as being not significantly stenosed.  Disease means involved vessel provides flow to a sufficient amount of myocardium to be clinically important.  If FFR testing indicates a vessel is not significant, that vessel should not be considered diseased (and the patient should be reclassified with respect to extent of functionally significant disease).  Proximal LAD + proximal left dominant LCX is considered 3 vessel CAD  2 Vessel CAD with FFR/iFR abnormal in only 1 but not both is considered 1 vessel CAD  Disease complexity includes occlusion, bifurcation, trifurcation, ostial, >20 mm, tortuosity, calcification, thrombus  LMCA disease is >=50% by angiography, MLD <2.8 mm, MLA <6 mm2; MLA 6-7.5 mm2 requires further physiologic  See  Table B for risk stratification based on noninvasive testing  Journal of the [employer] of Cardiology Mar 2017, 23391; DOI:  10.1016/j.jacc.2017.02.001 [email or web address].full-text.pdf This App  2018 by the Society for Cardiovascular Angiography and Interventions  Adrian Prows

## 2017-03-19 NOTE — Discharge Instructions (Signed)

## 2017-03-20 ENCOUNTER — Encounter (HOSPITAL_COMMUNITY): Payer: Self-pay | Admitting: Cardiology

## 2017-03-20 MED FILL — Verapamil HCl IV Soln 2.5 MG/ML: INTRAVENOUS | Qty: 2 | Status: AC

## 2017-03-21 ENCOUNTER — Encounter: Payer: Medicare Other | Admitting: Thoracic Surgery (Cardiothoracic Vascular Surgery)

## 2017-03-22 ENCOUNTER — Ambulatory Visit (INDEPENDENT_AMBULATORY_CARE_PROVIDER_SITE_OTHER): Payer: Medicare Other | Admitting: Physician Assistant

## 2017-03-22 DIAGNOSIS — Z23 Encounter for immunization: Secondary | ICD-10-CM | POA: Diagnosis not present

## 2017-03-23 ENCOUNTER — Encounter: Payer: Self-pay | Admitting: Thoracic Surgery (Cardiothoracic Vascular Surgery)

## 2017-03-23 ENCOUNTER — Institutional Professional Consult (permissible substitution) (INDEPENDENT_AMBULATORY_CARE_PROVIDER_SITE_OTHER): Payer: Medicare Other | Admitting: Thoracic Surgery (Cardiothoracic Vascular Surgery)

## 2017-03-23 ENCOUNTER — Ambulatory Visit: Payer: Medicare Other | Admitting: Family Medicine

## 2017-03-23 VITALS — BP 115/61 | HR 57 | Ht 69.0 in | Wt 173.0 lb

## 2017-03-23 DIAGNOSIS — I480 Paroxysmal atrial fibrillation: Secondary | ICD-10-CM

## 2017-03-23 DIAGNOSIS — I2510 Atherosclerotic heart disease of native coronary artery without angina pectoris: Secondary | ICD-10-CM

## 2017-03-23 NOTE — Progress Notes (Signed)
PCP is Patient, No Pcp Per Referring Provider is Adrian Prows, MD  Chief Complaint  Patient presents with  . New Patient (Initial Visit)    CABG, cath 03/19/2017    HPI: Jeremy Macdonald is an 83 year old gentleman sent for consultation regarding possible coronary artery bypass grafting.  Jeremy Macdonald is an 83 year old man with a history of hypertension, COPD, abdominal aortic aneurysm, extracranial carotid artery disease, and recent onset paroxysmal atrial fibrillation. He remains fairly active although not as active as he was a few years ago. He had an episode of atrial fibrillation with rapid ventricular response and June 2018. He says that he felt weak and sweaty. He denies chest pain, but it is noted in Dr. Irven Shelling recent note. He then did well until October 4 when he presented with dizziness. He again was in atrial fibrillation with a rapid response. He was hydrated and converted to sinus rhythm while in the ED.  Outside of those 2 episodes of atrial fibrillation he denies any chest pain, pressure, or tightness. He denies shortness of breath with exertion. He does walk on a daily basis and thinks he can go about a mile before having to stop.  He had cardiac catheterization on 03/19/2017. He was found to have three-vessel coronary disease  Past Medical History:  Diagnosis Date  . Abdominal aortic aneurysm (AAA) ([place])   . COPD (chronic obstructive pulmonary disease) ([place])   . Hypertension   carotid artery disease Hearing loss Atrial fibrillation   Past Surgical History:  Procedure Laterality Date  . APPENDECTOMY    . LEFT HEART CATH AND CORONARY ANGIOGRAPHY N/A 03/19/2017   Procedure: LEFT HEART CATH AND CORONARY ANGIOGRAPHY;  Surgeon: Adrian Prows, MD;  Location: [facility];  Service: Cardiovascular;  Laterality: N/A;  . TOOTH EXTRACTION      Family History  Problem Relation Age of Onset  . Heart attack Father   . Cancer Sister   . Lung cancer Brother     Social  History Social History  Substance Use Topics  . Smoking status: Current Some Day Smoker    Packs/day: 1.00    Years: 0.50  . Smokeless tobacco: Former Systems developer  . Alcohol use No    Current Outpatient Prescriptions  Medication Sig Dispense Refill  . acetaminophen (TYLENOL) 325 MG tablet Take 325 mg by mouth every 6 (six) hours as needed for headache.     Marland Kitchen atorvastatin (LIPITOR) 10 MG tablet Take 10 mg by mouth daily.    . benazepril-hydrochlorthiazide (LOTENSIN HCT) 20-25 MG tablet Take 1 tablet by mouth daily.    . Dutasteride-Tamsulosin HCl (JALYN) 0.5-0.4 MG CAPS Take 1 tablet by mouth daily.    . metoprolol tartrate (LOPRESSOR) 25 MG tablet Take 2 tablets (50 mg total) by mouth 2 (two) times daily. 30 tablet 0  . rivaroxaban (XARELTO) 15 MG TABS tablet Take 1 tablet (15 mg total) by mouth daily with supper. Start this evening     No current facility-administered medications for this visit.     Allergies  Allergen Reactions  . Phenergan [Promethazine Hcl]    .fam Review of Systems  Constitutional: Negative for activity change and unexpected weight change.  HENT: Positive for hearing loss. Negative for trouble swallowing and voice change.   Respiratory: Negative for cough and shortness of breath.   Cardiovascular: Positive for palpitations.  Gastrointestinal: Negative for abdominal pain and blood in stool.  Genitourinary: Positive for difficulty urinating and frequency.  Neurological: Positive for dizziness. Negative for  syncope.  Hematological: Bruises/bleeds easily.  All other systems reviewed and are negative.   BP 115/61   Pulse (!) 57   Ht 5\' 9"  (1.753 m)   Wt 173 lb (78.5 kg)   SpO2 96%   BMI 25.55 kg/m  Physical Exam  Constitutional: He is oriented to person, place, and time. He appears well-developed and well-nourished. No distress.  HENT:  Head: Normocephalic and atraumatic.  Eyes: Conjunctivae and EOM are normal. No scleral icterus.  Neck: Neck supple. No  thyromegaly present.  Bilateral carotid bruits  Cardiovascular: Normal rate and regular rhythm.   Murmur (2/6 systolic) heard. Pulmonary/Chest: Breath sounds normal. No respiratory distress. He has no wheezes. He has no rales.  Abdominal: Soft. He exhibits no distension. There is no tenderness.  Lymphadenopathy:    He has no cervical adenopathy.  Neurological: He is alert and oriented to person, place, and time. No cranial nerve deficit.  Very hard of hearing, motor grossly intact  Skin: Skin is warm and dry.  Atrophic with multiple ecchymoses  Vitals reviewed.    Diagnostic Tests: Cardiac catheterization Conclusion   Coronary angiogram 03/19/2017: Normal LVEDP. LV gram not performed to conserve contrast. Left main mid 30% stenosis calcific. Proximal LAD eccentric calcified 85% stenosis, tandem mid 40% stenosis. Small D1 severely diffusely diseased and occluded distally. Ramus intermediate large vessel with mild disease. Large circumflex occluded in the proximal segment, gives origin to large OM1 and AV groove branch. Bridging collaterals and ipsilateral collaterals noted. RCA large and dominant, proximal tandem long segment calcific 80% stenosis, mid tandem 90% stenosis. Proximal to the stenosis there is ectasia in the mid RCA. Large PDA with calcific 75% stenosis.  Recommendation: I will obtain cardiothoracic surgical consultation with Dr. Roxan Hockey, if he is not a candidate for CABG, then we'll consider single-vessel angioplasty to LAD as right coronary and circumflex are extremely complex versus continued medical therapy. 60 mL contrast utilized.   I personally reviewed the catheterization images and concur with the findings noted above. He has significant three-vessel coronary disease.  Impression: Jeremy Macdonald is an 83 year old gentleman with a history of atherosclerotic cardiovascular disease and recent onset paroxysmal atrial fibrillation. A cardiac catheterization was found to  have three-vessel disease. Other than presenting to times in the past 4 months with atrial fibrillation with a rapid ventricular response he has not had any cardiac symptoms.  Coronary artery bypass grafting is indicated for survival benefit, but is unlikely to provide any symptomatic relief given his minimal symptoms.  I had a long discussion with Mr. And Mrs. Radziewicz discussing the risks and benefits of coronary bypass grafting. We discussed the alternatives of medical therapy and angioplasty (LAD). There are multiple issues involved as he would potentially have survival benefit from coronary bypass grafting but is 83 years old and surgery could have a adverse effect on his quality of life. If he was having significant anginal symptoms with activity I would be more inclined to encourage him to have bypass surgery despite the risks.  I informed them of the indications, risks, benefits, and alternatives. They understand the risks include but are not limited to death, stroke, MI, DVT, PE, bleeding, possible need for transfusion, infection, cardiac arrhythmias, failure to thrive, as well as the possibility of other organ system failure. We discussed the expected hospital stay and the overall recovery. They do understand that he may not recover to his present state of functioning.  After discussing the potential risks with them, he does not want  to undergo bypass surgery at this time. He will follow-up with Dr. Einar Gip to discuss possible angioasty.   Melrose Nakayama, MD [facility] [phone]

## 2017-03-26 ENCOUNTER — Encounter: Payer: Self-pay | Admitting: Cardiology

## 2017-03-29 DIAGNOSIS — I714 Abdominal aortic aneurysm, without rupture: Secondary | ICD-10-CM | POA: Diagnosis not present

## 2017-03-29 DIAGNOSIS — I480 Paroxysmal atrial fibrillation: Secondary | ICD-10-CM | POA: Diagnosis not present

## 2017-03-29 DIAGNOSIS — I6523 Occlusion and stenosis of bilateral carotid arteries: Secondary | ICD-10-CM | POA: Diagnosis not present

## 2017-03-29 DIAGNOSIS — I25119 Atherosclerotic heart disease of native coronary artery with unspecified angina pectoris: Secondary | ICD-10-CM | POA: Diagnosis not present

## 2017-04-18 DIAGNOSIS — I25119 Atherosclerotic heart disease of native coronary artery with unspecified angina pectoris: Secondary | ICD-10-CM | POA: Diagnosis not present

## 2017-04-22 DIAGNOSIS — I2510 Atherosclerotic heart disease of native coronary artery without angina pectoris: Secondary | ICD-10-CM | POA: Diagnosis present

## 2017-04-22 DIAGNOSIS — I209 Angina pectoris, unspecified: Secondary | ICD-10-CM | POA: Diagnosis present

## 2017-04-22 NOTE — H&P (Signed)
OFFICE VISIT NOTES COPIED TO EPIC FOR DOCUMENTATION  . History of Present Illness Laverda Page MD; 03/29/2017 6:24 PM) Patient words: Last OV 03/08/2017; 7-10 day f/u cath.  The patient is a 83 year old male who presents for a Follow-up for Abdominal aortic aneurysm.  Additional reasons for visit:  Follow-up for Coronary artery disease   Follow-up for Atrial fibrillation is described as the following: He was seen in the emergency room today on 03/08/2017, presented with dizziness. He was found to be in atrial fibrillation with rapid ventricular response, received IV hydration and he spontaneously converted to sinus rhythm and was discharged home. This time patient also noticed chest discomfort which he did not mention in the emergency room. Presently doing well, has mild chronic dizziness. Accompanied by his wife at the bedside. He did have an episode of nonsustained VT while in the emergency room. This was a 6 beat VT. He has had another episode of atrial fibrillation with RVR in June 2018.   Coronary angiography on 03/19/2017 was found to have severe triple-vessel coronary artery disease. He did have a surgical consultation with Dr. Roxan Hockey, who felt that either continued medical therapy versus proceeding with angioplasty to the proximal LAD would be more appropriate given his age. He now presents here for follow-up. He has not had any further chest pain or palpitations.  Patient with known abdominal aortic aneurysm extending to the iliac vessels diagnosed in 2016, mild asymptomatic bilateral carotid artery stenosis and PAD with mild symptoms of claudication, hypertension and hyperlipidemia. Patient does smoke cigarettes on occasion. He has chronic mild dyspnea on exertion but denies any PND or orthopnea. He attributed this to chronic smoking and COPD. Although 83 years of age, he is been doing well and active. Turns 89 in 2 days.   Problem List/Past Medical Frances Furbish Johnson;  03/29/2017 6:29 PM) BPH (benign prostatic hyperplasia) (N40.0)  Benign essential hypertension (I10)  History of tobacco use disorder (U98.119)  Started back smoking in 09/2015 and has cut back to 2 cigarettes a day. Hyperlipidemia, mild (E78.5)  Atherosclerosis of native artery of both lower extremities with intermittent claudication (I70.213)  Lower extremity arterial duplex 06/03/2015: No hemodynamically significant stenoses are identified in the right lower extremity arterial system. No hemodynamically significant stenoses are identified in the left lower extremity arterial system. This exam reveals moderately decreased perfusion of the right lower extremity with RABI 0.72 and mildly decreased perfusion of the left lower extremity with ABI 0.83 noted at the post tibial artery level. Asymptomatic carotid artery stenosis, bilateral (J47.82)  Carotid artery duplex 02/15/2016: Stenosis in the right internal carotid artery (16-49%). Stenosis in the left internal carotid artery (16-49%). There is moderate mixed plaque bilateral carotid arteries. Antegrade vertebral artery flow. Follow up in one year is appropriate if clinically indicated. Compared to the study done on 06/01/2015, no significant change, right ICA stenosis appears to be less. Shortness of breath (R06.02)  Labwork  03/13/2017: Creatinine 1.15, EGFR 56/65, potassium 4.7, BMP normal. CBC normal. INR 1.2, prothrombin time 11.9. Labs 03/08/2017: Potassium 4.6, BUN 14, creatinine 1.18, eGFR 53 mL. HB 14.8/HCT 45.4, nitrates on 1125. Normal indicis. Magnesium 1.5. 09/07/2016: Normal H&H, MCV 99.7, MCH 33.1, platelets 99, CBC otherwise normal. Creatinine 1.08, potassium 4.4, CMP normal. Cholesterol 150, triglycerides 103, HDL 75, LDL 54. Labs 05/08/2016: Serum glucose 106 mg, creatinine 0.96, BUN 12, eGFR 74 mL, CMP normal. HB 17.0/HCT 50.2, platelets 120. Total cholesterol 148, triglycerides 70, HDL 79, LDL 55. A1c 5.4%. Labs  09/07/2015: BUN 18, serum  creatinine in 1.14, eGFR 61 mL, CMP otherwise normal. 04/26/2015: creatinine 1.1, eGFR 60 Chest pain, cardiac (R07.9)  Lexiscan myoview stress test 05/24/2015: 1. The resting electrocardiogram demonstrated normal sinus rhythm, normal resting conduction and no resting arrhythmias. Stress EKG is non-diagnostic for ischemia as it a pharmacologic stress using Lexiscan. Stress symptoms included dyspnea. 2. There is a moderate sized area of severe ischemia in the basal inferoseptal, basal inferior, basal inferolateral, mid inferior, mid inferolateral, apical inferior, apical lateral, apical and lateral myocardial wall(s). The left ventricular ejection fraction was calculated or visually estimated to be 42%. This is a high risk study. Consider further work-up Aneurysm of infrarenal abdominal aorta (I71.4) [04/02/2009]: CT of the abdomen 11/29/2016: Heterogeneous plaque, fusiform abdominal aortic aneurysm distal to the origin of IMA, 4.8 cm, enlarged compared to 4.4 cm from 04/26/2015. Circumferential adherent mural thrombus. 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 Mild stenosis with calcification left renal artery. High-grade stenosis at the origin of the right IIA, right external iliac artery high-grade stenosis. Severe calcification of the femoral arteries. Coronary calcification. Abdominal aortic duplex 08/23/2016: Moderate dilatation of the abdominal aorta is noted in the mid and distal aorta measuring 4.11 x 4.18 x 4.44 cm is seen. Diffuse plaque noted in the proximal aorta. Focal plaque noted in the mid and distal aorta. Compared to the study done on 02/15/2016, no significant change. Paroxysmal A-fib (I48.0) [11/21/2016]: CHA2DS2-VASc Score is 4 with yearly risk of stroke of 4 %. HAS-Bled score is2 and estimated major bleeding in one year is 1.8-3.2 %  Allergies Frances Furbish Johnson; Apr 25, 2017 2:16 PM) Phenergan *ANTIHISTAMINES*  confusion  Family History Frances Furbish Johnson;  April 25, 2017 2:16 PM) Mother  Deceased. at age 61 from unknown medical causes; no heart attacks or strokes or other known cardiovascular conditions Father  Deceased. at age 69 from heart attack; no other heart attacks prior to this event, no strokes or other cardiovascular conditions Sister 1  Deceased. at age 35 from cancer; 3 yrs older no known cardiovascular conditions Brother 1  Deceased. at age 1 from cancer; no cardiovascular conditions; 6 yrs older  Social History Cheri Kearns; 04/25/17 2:16 PM) Marital status  Married. Living Situation  Lives with spouse. Number of Children  3. 1-Deceased; All Adopted Alcohol Use  Occasional alcohol use. Current tobacco use  Light tobacco smoker.  Past Surgical History Cheri Kearns; April 25, 2017 2:16 PM) Appendectomy [2008]: Cataract Extraction-Bilateral [2013]:  Medication History Frances Furbish Johnson; 04/25/17 2:25 PM) Xarelto (15MG Tablet, 1 (one) Tablet Oral daily, Taken starting 03/20/2017) Active. Atorvastatin Calcium (10MG Tablet, 1 (one) Tablet Oral daily, Taken starting 03/14/2017) Active. Metoprolol Tartrate (50MG Tablet, 1 (one) Tablet Oral two times daily, Taken starting 03/08/2017) Active. Jalyn (0.5-0.4MG Capsule, 1 Oral daily) Active. Benazepril-Hydrochlorothiazide (20-25MG Tablet, 1 Oral daily) Active. Acetaminophen (325MG Tablet, 643m Oral every 6 hours as needed for headache) Active. Medications Reconciled (verbally with pt;)  Diagnostic Studies History (Laverda Page MD; [id number] PM) Carotid Doppler [02/15/2016]: Stenosis in the right internal carotid artery (16-49%). Stenosis in the left internal carotid artery (16-49%). There is moderate mixed plaque bilateral carotid arteries. Antegrade vertebral artery flow. Follow up in one year is appropriate if clinically indicated. Compared to the study done on 06/01/2015, no significant change, right ICA stenosis appears to be less. Chest X-ray  [03/08/2017]: The heart size and mediastinal contours are within normal limits. Atherosclerosis of thoracic aorta is noted. No pneumothorax or pleural effusion is noted.  Both lungs are clear. The visualized skeletal structures are unremarkable. Nuclear stress test [05/24/2015]: 1. The resting electrocardiogram demonstrated normal sinus rhythm, normal resting conduction and no resting arrhythmias. Stress EKG is non-diagnostic for ischemia as it a pharmacologic stress using Lexiscan. Stress symptoms included dyspnea. 2. There is a moderate sized area of severe ischemia in the basal inferoseptal, basal inferior, basal inferolateral, mid inferior, mid inferolateral, apical inferior, apical lateral, apical and lateral myocardial wall(s). The left ventricular ejection fraction was calculated or visually estimated to be 42%. This is a high risk study. Consider further work-up. CT Scan of Abdomen [11/29/2016]: Heterogeneous plaque, fusiform abdominal aortic aneurysm distal to the origin of IMA, 4.8 cm, enlarged compared to 4.4 cm from 04/26/2015. Circumferential adherent mural thrombus. 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 Mild stenosis with calcification left renal artery. High-grade stenosis at the origin of the right IIA, right external iliac artery high-grade stenosis. Severe calcification of the femoral arteries. Coronary calcification. Echocardiogram [06/01/2015]: 1. Left ventricle cavity is normal in size. Mild concentric hypertrophy of the left ventricle. Normal global wall motion. Doppler evidence of grade I (impaired) diastolic dysfunction. Diastolic dysfunction findings suggests elevated LA/LV end diastolic pressure. Calculated EF 52%. 2. Aortic valve not well visualized. There is moderate calcification of the aortic valve, mild to moderately restricted aortic valve movement. It appears to be tricuspid. Peak gradient 16, mean gradient 7.2 mmHg, calculated aortic  valve area 1.84 cm, mild aortic stenosis. Mild aortic regurgitation. 3. Mild calcification of the mitral valve annulus. Mild mitral regurgitation. 4. IVC is dilated with blunted respiratory response. Suggests elevated right heart pressure. Lower Extremity Dopplers [06/03/2015]: No hemodynamically significant stenoses are identified in the right lower extremity arterial system. No hemodynamically significant stenoses are identified in the left lower extremity arterial system. This exam reveals moderately decreased perfusion of the right lower extremity with RABI 0.72 and mildly decreased perfusion of the left lower extremity with ABI 0.83 noted at the post tibial artery level. Abdominal Duplex [02/15/2016]: Moderate dilatation of the abdominal aorta is noted in the distal aorta. An abdominal aortic aneurysm measuring 4.13 x 4.18 x 4.2 cm is seen. Focal plaque noted in the mid and distal aorta. Normal iliac velocity. Compared to the study done on 08/17/2015, AAA measured 3.76 x 3.76 x 4.15 cm. Recheck in 6 months. Coronary Angiogram     Review of Systems Laverda Page, MD; 03/29/2017 6:27 PM) General Present- Feeling well. Not Present- Anorexia, Fatigue and Fever. Respiratory Present- Difficulty Breathing on Exertion. Not Present- Cough, Decreased Exercise Tolerance and Dyspnea. Cardiovascular Present- Claudications (very mild leg cramps on extremes of exertion). Not Present- Chest Pain, Edema, Orthopnea, Paroxysmal Nocturnal Dyspnea and Shortness of Breath. Gastrointestinal Not Present- Change in Bowel Habits, Constipation and Nausea. Neurological Present- Dizziness. Not Present- Focal Neurological Symptoms. Endocrine Not Present- Appetite Changes, Cold Intolerance and Heat Intolerance. Hematology Not Present- Anemia, Petechiae and Prolonged Bleeding. All other systems negative  Vitals Frances Furbish Johnson; 03/29/2017 2:26 PM) 03/29/2017 2:22 PM Weight: 174.19 lb Height: 69in Body  Surface Area: 1.95 m Body Mass Index: 25.72 kg/m  Pulse: 42 (Regular)  P.OX: 91% (Room air) BP: 112/64 (Sitting, Left Arm, Standard)       Physical Exam Laverda Page, MD; 03/29/2017 6:27 PM) General Mental Status-Alert. General Appearance-Cooperative, Appears stated age, Not in acute distress. Orientation-Oriented X3. Build & Nutrition-Well built and Well nourished.  Head and Neck Thyroid Gland Characteristics - no palpable nodules, no palpable  enlargement.  Chest and Lung Exam Inspection Shape - Barrel-shaped and Increased AP diameter. Palpation Tender - No chest wall tenderness. Auscultation Breath sounds - Decreased - Both Lung Fields(distant). Adventitious sounds - Inspiratory & expiratory crackles - Both Lung Fields.  Cardiovascular Inspection Jugular vein - Right - No Distention. Auscultation Heart Sounds - S1 WNL, S2 WNL and No gallop present. Murmurs & Other Heart Sounds: Murmur - Location - Aortic Area. Grade - I/VI. Radiation - Carotids.  Abdomen Palpation/Percussion Normal exam - Non Tender and No hepatosplenomegaly. Auscultation Normal exam - Bowel sounds normal.  Peripheral Vascular Lower Extremity Inspection - Left - No Pigmentation, No Varicose veins. Right - No Pigmentation, No Varicose veins. Palpation - Edema - Bilateral - No edema. Femoral pulse - Left - Feeble. Right - Absent. Popliteal pulse - Bilateral - Absent. Dorsalis pedis pulse - Bilateral - Absent. Posterior tibial pulse - Bilateral - Absent. Carotid arteries - Left-No Carotid bruit. Carotid arteries - Right-No Carotid bruit. Abdomen-Prominent abdominal aortic pulsation, No epigastric bruit.  Neurologic Motor-Grossly intact without any focal deficits.  Musculoskeletal - Did not examine.  Assessment & Plan Laverda Page MD; 03/29/2017 6:30 PM) Atherosclerosis of native coronary artery of native heart with angina pectoris (I25.119) Story: Coronary  angiogram 03/19/2017: Normal LVEDP. Left main mid 30% stenosis calcific. Proximal LAD eccentric calcified 85% stenosis, tandem mid 40% stenosis. Small D1 severely diffusely diseased and occluded distally. Ramus intermediate mild disease. Large circumflex occluded in the proximal segment, gives origin to large OM1 and AV groove branch. Bridging collaterals and ipsilateral collaterals noted. RCA large and dominant, proximal tandem long segment calcific 80% stenosis, mid tandem 90% stenosis. Proximal to the stenosis there is ectasia in the mid RCA. Large PDA with calcific 75% stenosis.  Paroxysmal A-fib (I48.0) Story: CHA2DS2-VASc Score is 4 with yearly risk of stroke of 4 %. HAS-Bled score is2 and estimated major bleeding in one year is 1.8-3.2 % Impression: EKG 03/08/2017 at 9:50 AM: Atrial fibrillation with RVR, PVC, inferior and lateral ST depression, secondary ST-T wave changes versus ischemia. Repeat EKG at 11:38 AM: Normal sinus rhythm, poor R-wave progression. ST changes resolved.  EKG at [facility] 11/21/2016: Atrial fibrillation with RVR and 152 bpm, normal axis, poor R-wave progression, cannot exclude anterior infarct old. Aneurysm of infrarenal abdominal aorta (I71.4) Story: CT of the abdomen 11/29/2016: Heterogeneous plaque, fusiform abdominal aortic aneurysm distal to the origin of IMA, 4.8 cm, enlarged compared to 4.4 cm from 04/26/2015. Circumferential adherent mural thrombus. 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 Mild stenosis with calcification left renal artery. High-grade stenosis at the origin of the right IIA, right external iliac artery high-grade stenosis. Severe calcification of the femoral arteries. Coronary calcification.  Abdominal aortic duplex 08/23/2016: Moderate dilatation of the abdominal aorta is noted in the mid and distal aorta measuring 4.11 x 4.18 x 4.44 cm is seen. Diffuse plaque noted in the proximal aorta. Focal plaque  noted in the mid and distal aorta. Compared to the study done on 02/15/2016, no significant change. Future Plans 08/14/2017: Duplex scan of aorta, inferior vena cava, iliac vasculature, or bypass grafts; complete study (76226) - one time Labwork Labs 04/18/2017: Serum glucose 95 mg, BUN 21, creatinine 1.11, EGFR 59 mL.  Potassium 5.0.  Hb 14.8/HCT 45.2, normal indices.  Platelets 147.  Pro time normal.  03/13/2017: Creatinine 1.15, EGFR 56/65, potassium 4.7, BMP normal. CBC normal. INR 1.2, prothrombin time 11.9.  Labs 03/08/2017: Potassium 4.6,  BUN 14, creatinine 1.18, eGFR 53 mL. HB 14.8/HCT 45.4, nitrates on 1125. Normal indicis. Magnesium 1.5.  09/07/2016: Normal H&H, MCV 99.7, MCH 33.1, platelets 99, CBC otherwise normal. Creatinine 1.08, potassium 4.4, CMP normal. Cholesterol 150, triglycerides 103, HDL 75, LDL 54.  Labs 05/08/2016: Serum glucose 106 mg, creatinine 0.96, BUN 12, eGFR 74 mL, CMP normal. HB 17.0/HCT 50.2, platelets 120. Total cholesterol 148, triglycerides 70, HDL 79, LDL 55. A1c 5.4%.  Asymptomatic carotid artery stenosis, bilateral (E42.35) Story: Carotid artery duplex 02/15/2016: Stenosis in the right internal carotid artery (16-49%). Stenosis in the left internal carotid artery (16-49%). There is moderate mixed plaque bilateral carotid arteries. Antegrade vertebral artery flow. Follow up in one year is appropriate if clinically indicated. Compared to the study done on 06/01/2015, no significant change, right ICA stenosis appears to be less.  Future Plans 08/14/2017: Duplex scan bilateral carotid arteries (36144) - one time Note:-  Recommendations:  Patient was seen in the emergency room today when he presented with atrial flutter fibrillation with rapid ventricular response. He did have significant ST changes to suggest ischemia, ST he has had abnormal stress test that was medically managed as he was asymptomatic and also due to his advanced age. However today when he  presented with atrial fibrillation with RVR, did have chest discomfort, and also had an episode of 6 beat NSVT.  I discussed with him that complex and right coronary artery are not amenable for percutaneous revascularization as her be extremely high risk however proximal LAD high-grade 85% stenosis can be relatively easily be treated percutaneously. This would be the most minimal approach, fairly large LAD, may be protective in view of collaterals to the circumflex coronary artery, nuclear stress test had revealed significant ischemia in this region. After long discussion, patient wants to proceed with percutaneous angioplasty. He is on Xarelto for atrial fibrillation, I have discussed the complications of dual antiplatelet therapy, risk of renal failure specifically and also stroke in view of his advanced age. He and his wife both feel that given proximal LAD stenosis, may improve longevity of his life and hence want to proceed.  As he is only very mild asymptomatic carotid stenosis, I have recommended no further surveillance  CC Dr. Yaakov Guthrie.  Signed by Laverda Page, MD (03/29/2017 6:31 PM)

## 2017-04-24 ENCOUNTER — Encounter (HOSPITAL_COMMUNITY): Payer: Self-pay | Admitting: Cardiology

## 2017-04-24 ENCOUNTER — Encounter (HOSPITAL_COMMUNITY)
Admission: RE | Disposition: A | Discharge status: Home / Self Care | Payer: Self-pay | Source: Ambulatory Visit | Attending: Cardiology

## 2017-04-24 ENCOUNTER — Ambulatory Visit (HOSPITAL_COMMUNITY)
Admission: RE | Admit: 2017-04-24 | Discharge: 2017-04-25 | Disposition: A | Discharge status: Home / Self Care | Payer: Medicare Other | Source: Ambulatory Visit | Attending: Cardiology | Admitting: Cardiology

## 2017-04-24 ENCOUNTER — Other Ambulatory Visit: Payer: Self-pay

## 2017-04-24 DIAGNOSIS — Z79899 Other long term (current) drug therapy: Secondary | ICD-10-CM | POA: Diagnosis not present

## 2017-04-24 DIAGNOSIS — Z7902 Long term (current) use of antithrombotics/antiplatelets: Secondary | ICD-10-CM | POA: Diagnosis not present

## 2017-04-24 DIAGNOSIS — Z7901 Long term (current) use of anticoagulants: Secondary | ICD-10-CM | POA: Insufficient documentation

## 2017-04-24 DIAGNOSIS — E785 Hyperlipidemia, unspecified: Secondary | ICD-10-CM | POA: Diagnosis not present

## 2017-04-24 DIAGNOSIS — I25118 Atherosclerotic heart disease of native coronary artery with other forms of angina pectoris: Secondary | ICD-10-CM | POA: Diagnosis not present

## 2017-04-24 DIAGNOSIS — I482 Chronic atrial fibrillation: Secondary | ICD-10-CM | POA: Diagnosis not present

## 2017-04-24 DIAGNOSIS — Z9861 Coronary angioplasty status: Secondary | ICD-10-CM | POA: Diagnosis present

## 2017-04-24 DIAGNOSIS — I480 Paroxysmal atrial fibrillation: Secondary | ICD-10-CM | POA: Diagnosis not present

## 2017-04-24 DIAGNOSIS — Z955 Presence of coronary angioplasty implant and graft: Secondary | ICD-10-CM

## 2017-04-24 DIAGNOSIS — I2510 Atherosclerotic heart disease of native coronary artery without angina pectoris: Secondary | ICD-10-CM | POA: Diagnosis present

## 2017-04-24 DIAGNOSIS — I209 Angina pectoris, unspecified: Secondary | ICD-10-CM | POA: Diagnosis present

## 2017-04-24 DIAGNOSIS — I10 Essential (primary) hypertension: Secondary | ICD-10-CM | POA: Diagnosis not present

## 2017-04-24 HISTORY — PX: CORONARY STENT INTERVENTION: CATH118234

## 2017-04-24 HISTORY — DX: Pneumonia, unspecified organism: J18.9

## 2017-04-24 HISTORY — DX: Unspecified osteoarthritis, unspecified site: M19.90

## 2017-04-24 HISTORY — DX: Pure hypercholesterolemia, unspecified: E78.00

## 2017-04-24 HISTORY — PX: CORONARY ATHERECTOMY: CATH118238

## 2017-04-24 HISTORY — DX: Atherosclerotic heart disease of native coronary artery without angina pectoris: I25.10

## 2017-04-24 HISTORY — DX: Unspecified malignant neoplasm of skin, unspecified: C44.90

## 2017-04-24 HISTORY — DX: Unspecified atrial fibrillation: I48.91

## 2017-04-24 LAB — POCT ACTIVATED CLOTTING TIME
ACTIVATED CLOTTING TIME: 274 s
Activated Clotting Time: 296 seconds

## 2017-04-24 SURGERY — CORONARY STENT INTERVENTION
Anesthesia: LOCAL

## 2017-04-24 MED ORDER — CLOPIDOGREL BISULFATE 75 MG PO TABS
600.0000 mg | ORAL_TABLET | ORAL | Status: AC
  Administered 2017-04-24: 600 mg via ORAL

## 2017-04-24 MED ORDER — SODIUM CHLORIDE 0.9% FLUSH: 3.0000 mL | INTRAVENOUS | Status: DC | PRN

## 2017-04-24 MED ORDER — PANTOPRAZOLE SODIUM 40 MG PO TBEC
DELAYED_RELEASE_TABLET | ORAL | Status: AC
  Administered 2017-04-24: 40 mg via ORAL
  Filled 2017-04-24: qty 1

## 2017-04-24 MED ORDER — SODIUM CHLORIDE 0.9 % WEIGHT BASED INFUSION
3.0000 mL/kg/h | INTRAVENOUS | Status: DC
  Administered 2017-04-24: 3 mL/kg/h via INTRAVENOUS

## 2017-04-24 MED ORDER — HEPARIN SODIUM (PORCINE) 1000 UNIT/ML IJ SOLN
INTRAMUSCULAR | Status: DC | PRN
  Administered 2017-04-24: 7000 [IU] via INTRAVENOUS
  Administered 2017-04-24: 2000 [IU] via INTRAVENOUS

## 2017-04-24 MED ORDER — BENAZEPRIL-HYDROCHLOROTHIAZIDE 20-25 MG PO TABS: 1.0000 | ORAL_TABLET | Freq: Every day | ORAL | Status: DC

## 2017-04-24 MED ORDER — LABETALOL HCL 5 MG/ML IV SOLN: 10.0000 mg | INTRAVENOUS | Status: AC | PRN

## 2017-04-24 MED ORDER — PANTOPRAZOLE SODIUM 40 MG PO TBEC
40.0000 mg | DELAYED_RELEASE_TABLET | Freq: Every day | ORAL | Status: DC
  Administered 2017-04-24: 40 mg via ORAL

## 2017-04-24 MED ORDER — LIDOCAINE HCL (PF) 1 % IJ SOLN
INTRAMUSCULAR | Status: DC | PRN
  Administered 2017-04-24: 2 mL

## 2017-04-24 MED ORDER — HYDRALAZINE HCL 20 MG/ML IJ SOLN: 5.0000 mg | INTRAMUSCULAR | Status: AC | PRN

## 2017-04-24 MED ORDER — ASPIRIN 81 MG PO CHEW
81.0000 mg | CHEWABLE_TABLET | ORAL | Status: AC
Start: 2017-04-24 — End: 2017-04-24
  Administered 2017-04-24: 81 mg via ORAL

## 2017-04-24 MED ORDER — IOPAMIDOL (ISOVUE-370) INJECTION 76%
INTRAVENOUS | Status: AC
  Filled 2017-04-24: qty 125

## 2017-04-24 MED ORDER — ACETAMINOPHEN 325 MG PO TABS
650.0000 mg | ORAL_TABLET | ORAL | Status: DC | PRN
  Administered 2017-04-24: 650 mg via ORAL
  Filled 2017-04-24: qty 2

## 2017-04-24 MED ORDER — RIVAROXABAN 15 MG PO TABS
15.0000 mg | ORAL_TABLET | Freq: Every day | ORAL | Status: DC
  Administered 2017-04-24: 15 mg via ORAL
  Filled 2017-04-24: qty 1

## 2017-04-24 MED ORDER — MIDAZOLAM HCL 2 MG/2ML IJ SOLN
INTRAMUSCULAR | Status: AC
  Filled 2017-04-24: qty 2

## 2017-04-24 MED ORDER — ANGIOPLASTY BOOK
Freq: Once | Status: AC
  Administered 2017-04-24: 1
  Filled 2017-04-24: qty 1

## 2017-04-24 MED ORDER — HEPARIN (PORCINE) IN NACL 2-0.9 UNIT/ML-% IJ SOLN
INTRAMUSCULAR | Status: AC
  Filled 2017-04-24: qty 1000

## 2017-04-24 MED ORDER — ONDANSETRON HCL 4 MG/2ML IJ SOLN: 4.0000 mg | Freq: Four times a day (QID) | INTRAMUSCULAR | Status: DC | PRN

## 2017-04-24 MED ORDER — LIDOCAINE HCL (PF) 1 % IJ SOLN
INTRAMUSCULAR | Status: AC
  Filled 2017-04-24: qty 30

## 2017-04-24 MED ORDER — HEPARIN SODIUM (PORCINE) 1000 UNIT/ML IJ SOLN
INTRAMUSCULAR | Status: AC
  Filled 2017-04-24: qty 1

## 2017-04-24 MED ORDER — SODIUM CHLORIDE 0.9% FLUSH: 3.0000 mL | Freq: Two times a day (BID) | INTRAVENOUS | Status: DC

## 2017-04-24 MED ORDER — IOPAMIDOL (ISOVUE-370) INJECTION 76%
INTRAVENOUS | Status: DC | PRN
  Administered 2017-04-24: 135 mL via INTRAVENOUS

## 2017-04-24 MED ORDER — ASPIRIN 81 MG PO CHEW
CHEWABLE_TABLET | ORAL | Status: AC
  Administered 2017-04-24: 81 mg via ORAL
  Filled 2017-04-24: qty 1

## 2017-04-24 MED ORDER — METOPROLOL TARTRATE 50 MG PO TABS
50.0000 mg | ORAL_TABLET | Freq: Two times a day (BID) | ORAL | Status: DC
  Administered 2017-04-24 – 2017-04-25 (×2): 50 mg via ORAL
  Filled 2017-04-24: qty 2
  Filled 2017-04-24: qty 1
  Filled 2017-04-24: qty 2
  Filled 2017-04-24: qty 1

## 2017-04-24 MED ORDER — ACETAMINOPHEN 325 MG PO TABS: 325.0000 mg | ORAL_TABLET | Freq: Every day | ORAL | Status: DC | PRN

## 2017-04-24 MED ORDER — HYDROCHLOROTHIAZIDE 25 MG PO TABS
25.0000 mg | ORAL_TABLET | Freq: Every day | ORAL | Status: DC
  Administered 2017-04-25: 10:00:00 25 mg via ORAL
  Filled 2017-04-24: qty 1

## 2017-04-24 MED ORDER — SODIUM CHLORIDE 0.9 % WEIGHT BASED INFUSION: 1.0000 mL/kg/h | INTRAVENOUS | Status: DC

## 2017-04-24 MED ORDER — SODIUM CHLORIDE 0.9 % WEIGHT BASED INFUSION
1.0000 mL/kg/h | INTRAVENOUS | Status: AC
  Administered 2017-04-24: 1 mL/kg/h via INTRAVENOUS

## 2017-04-24 MED ORDER — SODIUM CHLORIDE 0.9 % IV SOLN: 250.0000 mL | INTRAVENOUS | Status: DC | PRN

## 2017-04-24 MED ORDER — HEPARIN (PORCINE) IN NACL 2-0.9 UNIT/ML-% IJ SOLN
INTRAMUSCULAR | Status: AC | PRN
  Administered 2017-04-24: 1000 mL

## 2017-04-24 MED ORDER — IOPAMIDOL (ISOVUE-370) INJECTION 76%
INTRAVENOUS | Status: AC
  Filled 2017-04-24: qty 50

## 2017-04-24 MED ORDER — FENTANYL CITRATE (PF) 100 MCG/2ML IJ SOLN
INTRAMUSCULAR | Status: DC | PRN
  Administered 2017-04-24: 25 ug via INTRAVENOUS

## 2017-04-24 MED ORDER — DUTASTERIDE-TAMSULOSIN HCL 0.5-0.4 MG PO CAPS: 1.0000 | ORAL_CAPSULE | Freq: Every day | ORAL | Status: DC

## 2017-04-24 MED ORDER — CLOPIDOGREL BISULFATE 300 MG PO TABS
ORAL_TABLET | ORAL | Status: AC
  Administered 2017-04-24: 600 mg via ORAL
  Filled 2017-04-24: qty 2

## 2017-04-24 MED ORDER — FENTANYL CITRATE (PF) 100 MCG/2ML IJ SOLN
INTRAMUSCULAR | Status: AC
  Filled 2017-04-24: qty 2

## 2017-04-24 MED ORDER — TAMSULOSIN HCL 0.4 MG PO CAPS
0.4000 mg | ORAL_CAPSULE | Freq: Every day | ORAL | Status: DC
  Administered 2017-04-25: 10:00:00 0.4 mg via ORAL
  Filled 2017-04-24: qty 1

## 2017-04-24 MED ORDER — ATORVASTATIN CALCIUM 10 MG PO TABS
10.0000 mg | ORAL_TABLET | Freq: Every day | ORAL | Status: DC
  Administered 2017-04-25: 10 mg via ORAL
  Filled 2017-04-24: qty 1

## 2017-04-24 MED ORDER — CLOPIDOGREL BISULFATE 75 MG PO TABS
75.0000 mg | ORAL_TABLET | Freq: Every day | ORAL | Status: DC
  Administered 2017-04-25: 08:00:00 75 mg via ORAL
  Filled 2017-04-24: qty 1

## 2017-04-24 MED ORDER — MIDAZOLAM HCL 2 MG/2ML IJ SOLN
INTRAMUSCULAR | Status: DC | PRN
  Administered 2017-04-24: 1 mg via INTRAVENOUS

## 2017-04-24 MED ORDER — DUTASTERIDE 0.5 MG PO CAPS
0.5000 mg | ORAL_CAPSULE | Freq: Every day | ORAL | Status: DC
  Administered 2017-04-25: 10:00:00 0.5 mg via ORAL
  Filled 2017-04-24 (×2): qty 1

## 2017-04-24 MED ORDER — BENAZEPRIL HCL 20 MG PO TABS
20.0000 mg | ORAL_TABLET | Freq: Every day | ORAL | Status: DC
  Administered 2017-04-25: 20 mg via ORAL
  Filled 2017-04-24: qty 1

## 2017-04-24 MED ORDER — VERAPAMIL HCL 2.5 MG/ML IV SOLN
INTRAVENOUS | Status: DC | PRN
  Administered 2017-04-24: 5 mL via INTRA_ARTERIAL

## 2017-04-24 MED ORDER — SODIUM CHLORIDE 0.9 % IV SOLN
INTRAVENOUS | Status: AC | PRN
  Administered 2017-04-24: 500 mL/h via INTRAVENOUS

## 2017-04-24 MED ORDER — NITROGLYCERIN 1 MG/10 ML FOR IR/CATH LAB
INTRA_ARTERIAL | Status: DC | PRN
  Administered 2017-04-24 (×2): 200 ug via INTRACORONARY

## 2017-04-24 SURGICAL SUPPLY — 19 items
BALLN MAVERICK OTW 3.0X15 (BALLOONS) ×2
BALLN SAPPHIRE ~~LOC~~ 3.5X15 (BALLOONS) ×1 IMPLANT
BALLOON MAVERICK OTW 3.0X15 (BALLOONS) IMPLANT
CATH VISTA GUIDE 6FR XB3.5 (CATHETERS) ×1 IMPLANT
CROWN DIAMONDBACK CLASSIC 1.25 (BURR) ×1 IMPLANT
DEVICE RAD COMP TR BAND LRG (VASCULAR PRODUCTS) ×1 IMPLANT
ELECT DEFIB PAD ADLT CADENCE (PAD) ×1 IMPLANT
GLIDESHEATH SLEND A-KIT 6F 20G (SHEATH) ×1 IMPLANT
GUIDEWIRE INQWIRE 1.5J.035X260 (WIRE) IMPLANT
INQWIRE 1.5J .035X260CM (WIRE) ×2
KIT ENCORE 26 ADVANTAGE (KITS) ×3 IMPLANT
KIT HEART LEFT (KITS) ×2 IMPLANT
PACK CARDIAC CATHETERIZATION (CUSTOM PROCEDURE TRAY) ×2 IMPLANT
STENT RESOLUTE ONYX 3.0X26 (Permanent Stent) ×1 IMPLANT
TRANSDUCER W/STOPCOCK (MISCELLANEOUS) ×2 IMPLANT
TUBING CIL FLEX 10 FLL-RA (TUBING) ×2 IMPLANT
WIRE ASAHI PROWATER 180CM (WIRE) ×1 IMPLANT
WIRE ASAHI SOFT 180CM (WIRE) ×1 IMPLANT
WIRE VIPER ADVANCE COR .012TIP (WIRE) ×2 IMPLANT

## 2017-04-24 NOTE — Interval H&P Note (Signed)
History and Physical Interval Note:  04/24/2017 12:24 PM  Jeremy Macdonald  has presented today for surgery, with the diagnosis of CAD  The various methods of treatment have been discussed with the patient and family. After consideration of risks, benefits and other options for treatment, the patient has consented to  Procedure(s): CORONARY STENT INTERVENTION (N/A) as a surgical intervention .  The patient's history has been reviewed, patient examined, no change in status, stable for surgery.  I have reviewed the patient's chart and labs.  Questions were answered to the patient's satisfaction.     Symptom Status: Ischemic Symptoms Non-invasive Testing: Indeterminate If no or indeterminate stress test, FFR/iFR results in all diseased vessels: Not done Diabetes Mellitus: No S/P CABG: No Antianginal therapy (number of long-acting drugs): 1 Patient undergoing renal transplant: No Patient undergoing percutaneous valve procedure: No   1 Vessel Disease No proximal LAD involvement, No proximal left dominant LCX involvement             PCI: Not rated             CABG: Not rated Proximal left dominant LCX involvement             PCI: Not rated             CABG: Not rated Proximal LAD involvement             PCI: Not rated             CABG: Not rated  2 Vessel Disease No proximal LAD involvement             PCI: Not rated             CABG: Not rated Proximal LAD involvement             PCI: Not rated             CABG: Not rated  3 Vessel Disease Low disease complexity (e.g., focal stenoses, SYNTAX <=22)             PCI: Not rated             CABG: Not rated Intermediate or high disease complexity (e.g., SYNTAX >=23)             PCI: Not rated             CABG: Not rated  Left Main Disease Isolated LMCA disease: ostial or midshaft             PCI: A (7);  Indication 24             CABG: A (9);  Indication 24 Isolated LMCA disease: bifurcation involvement  PCI: M (5);  Indication 25             CABG: A (9);  Indication 25 LMCA ostial or midshaft, concurrent low disease burden multivessel disease (e.g., 1-2 additional focal stenoses, SYNTAX <=22)             PCI: A (7);  Indication 26             CABG: A (9);  Indication 26 LMCA ostial or midshaft, concurrent intermediate or high disease burden multivessel disease (e.g., 1-2 additional bifurcation stenoses, long stenoses, SYNTAX >=23)             PCI: M (4);  Indication 27             CABG: A (9);  Indication 27 LMCA bifurcation involvement, concurrent  low disease burden multivessel disease (e.g., 1-2 additional focal stenoses, SYNTAX <=22)             PCI: M (5);  Indication 28             CABG: A (9);  Indication 28 LMCA bifurcation involvement, concurrent intermediate or high disease burden multivessel disease (e.g., 1-2 additional bifurcation stenoses, long stenoses, SYNTAX >=23)             PCI: R (3);  Indication 29             CABG: A (9);  Indication 29  Notes:             A indicates appropriate. M indicates may be appropriate. R indicates rarely appropriate. Number in parentheses is median score for that indication. Reclassify indicates number of functionally diseased vessels should be decreased given negative FFR/iFR. Re-evaluate the scenario interpreting any FFR/iFR negative vessel as being not significantly stenosed.             Disease means involved vessel provides flow to a sufficient amount of myocardium to be clinically important.             If FFR testing indicates a vessel is not significant, that vessel should not be considered diseased (and the patient should be reclassified with respect to extent of functionally significant disease).             Proximal LAD + proximal left dominant LCX is considered 3 vessel CAD             2 Vessel CAD with FFR/iFR abnormal in only 1 but not both is considered 1 vessel CAD             Disease complexity includes occlusion, bifurcation,  trifurcation, ostial, >20 mm, tortuosity, calcification, thrombus             LMCA disease is >=50% by angiography, MLD <2.8 mm, MLA <6 mm2; MLA 6-7.5 mm2 requires further physiologic             See Table B for risk stratification based on noninvasive testing  Journal of the [employer] of Cardiology Mar 2017, 23391; DOI: 10.1016/j.[email or web address] [email or web address].full-text.pdf This App  2018 by the Society for Cardiovascular Angiography and Interventions  Adrian Prows

## 2017-04-24 NOTE — Progress Notes (Signed)
TR BAND REMOVAL  LOCATION:  right radial  DEFLATED PER PROTOCOL:  Yes.    TIME BAND OFF / DRESSING APPLIED:   1845   SITE UPON ARRIVAL:   Level 0  SITE AFTER BAND REMOVAL:  Level 0  CIRCULATION SENSATION AND MOVEMENT:  Within Normal Limits  Yes.    COMMENTS:

## 2017-04-25 DIAGNOSIS — I10 Essential (primary) hypertension: Secondary | ICD-10-CM | POA: Diagnosis not present

## 2017-04-25 DIAGNOSIS — I25118 Atherosclerotic heart disease of native coronary artery with other forms of angina pectoris: Secondary | ICD-10-CM | POA: Diagnosis not present

## 2017-04-25 DIAGNOSIS — Z7902 Long term (current) use of antithrombotics/antiplatelets: Secondary | ICD-10-CM | POA: Diagnosis not present

## 2017-04-25 DIAGNOSIS — I480 Paroxysmal atrial fibrillation: Secondary | ICD-10-CM | POA: Diagnosis not present

## 2017-04-25 DIAGNOSIS — Z79899 Other long term (current) drug therapy: Secondary | ICD-10-CM | POA: Diagnosis not present

## 2017-04-25 DIAGNOSIS — I482 Chronic atrial fibrillation: Secondary | ICD-10-CM | POA: Diagnosis not present

## 2017-04-25 DIAGNOSIS — E785 Hyperlipidemia, unspecified: Secondary | ICD-10-CM | POA: Diagnosis not present

## 2017-04-25 DIAGNOSIS — Z7901 Long term (current) use of anticoagulants: Secondary | ICD-10-CM | POA: Diagnosis not present

## 2017-04-25 LAB — BASIC METABOLIC PANEL
Anion gap: 7 (ref 5–15)
BUN: 15 mg/dL (ref 6–20)
CHLORIDE: 105 mmol/L (ref 101–111)
CO2: 27 mmol/L (ref 22–32)
CREATININE: 1.13 mg/dL (ref 0.61–1.24)
Calcium: 8.1 mg/dL — ABNORMAL LOW (ref 8.9–10.3)
GFR calc non Af Amer: 56 mL/min — ABNORMAL LOW (ref >60)
GLUCOSE: 93 mg/dL (ref 65–99)
Potassium: 4.1 mmol/L (ref 3.5–5.1)
Sodium: 139 mmol/L (ref 135–145)

## 2017-04-25 LAB — CBC
HCT: 40.2 % (ref 39.0–52.0)
Hemoglobin: 12.7 g/dL — ABNORMAL LOW (ref 13.0–17.0)
MCH: 31.7 pg (ref 26.0–34.0)
MCHC: 31.6 g/dL (ref 30.0–36.0)
MCV: 100.2 fL — ABNORMAL HIGH (ref 78.0–100.0)
Platelets: 137 10*3/uL — ABNORMAL LOW (ref 150–400)
RBC: 4.01 MIL/uL — ABNORMAL LOW (ref 4.22–5.81)
RDW: 13.3 % (ref 11.5–15.5)
WBC: 7 10*3/uL (ref 4.0–10.5)

## 2017-04-25 MED ORDER — METOPROLOL TARTRATE 25 MG PO TABS: 50.0000 mg | ORAL_TABLET | Freq: Three times a day (TID) | ORAL | 0 refills | Status: DC

## 2017-04-25 MED ORDER — CLOPIDOGREL BISULFATE 75 MG PO TABS: 75.0000 mg | ORAL_TABLET | Freq: Every day | ORAL | 0 refills | Status: DC

## 2017-04-25 NOTE — Discharge Summary (Signed)
Physician Discharge Summary  Patient ID: Jeremy Macdonald MRN: [id number] DOB/AGE: 1925/06/22 83 y.o.  Admit date: 04/24/2017 Discharge date: 04/25/2017  Admission Diagnoses: CAD of native vessel with angina pectoris.  Discharge Diagnoses:  Principal Problem:    Angina pectoris ([place]) CAD (coronary artery disease), native coronary artery S/P PTCA Paroxysmal A. Fib: CHA2DS2-VASc Score is 4 with yearly risk of stroke of 4 %.  Discharged Condition: good  Hospital Course: Patient with paroxysmal atrial fibrillation, hypertension, hyperlipidemia, prior tobacco use disorder, has a small abdominal aortic aneurysm, underwent coronary angiography on 03/19/2017 and found to have severe triple-vessel coronary artery disease and was referred to Dr. Roxan Hockey who was referred to as high risk for CABG and hence recommended angioplasty to LAD be more appropriate and patient has CTO circumflex and extremely complex high-grade large RCA stenosis which probably needed to be left alone.  He was relatively scheduled for cardiac catheterization and angioplasty on 04/24/2017 and underwent complex but successful PTCA and stenting of the LAD following atherectomy.  Postprocedure he did well, on the day of discharge, patient stated that for some reason he feels like in his chest and also his breathing is improved.  He was felt stable for discharge.  Consults: None  Significant Diagnostic Studies:  Elective PTCA and stenting of the proximal LAD 04/24/2017: CSI atherectomy followed by DES stenting with 3.0 x 26 mm resolute Onyx followed by postdilatation with a 3.5 x 15 mm noncompliant balloon at 16 atm of pressure. Stenosis reduced from 90% to 0%  Discharge Exam: Blood pressure (!) 153/92, pulse 87, temperature 98.1 F (36.7 C), temperature source Oral, resp. rate (!) 23, height '5\' 9"'$  (1.753 m), weight 80.2 kg (176 lb 12.9 oz), SpO2 98 %.   General appearance: alert, cooperative, appears stated age and no  distress Resp: clear to auscultation bilaterally Cardio: irregularly irregular rhythm and S1 is variable, S2 is normal,distant heart sounds, no gallop or murmur appreciated. GI: soft, non-tender; bowel sounds normal; no masses,  no organomegaly Extremities: extremities normal, atraumatic, no cyanosis or edema Neurologic: Grossly normal Arterial access: Right radial arterial access stable without complications.  Disposition: 01-Home or Self Care  Discharge Instructions    Amb Referral to Cardiac Rehabilitation   Complete by:  As directed    Diagnosis:  Coronary Stents     Allergies as of 04/25/2017      Reactions   Phenergan [promethazine Hcl] Other (See Comments)   Confusion       Medication List    TAKE these medications   acetaminophen 325 MG tablet Commonly known as:  TYLENOL Take 325 mg daily as needed by mouth for moderate pain.   atorvastatin 10 MG tablet Commonly known as:  LIPITOR Take 10 mg by mouth daily.   benazepril-hydrochlorthiazide 20-25 MG tablet Commonly known as:  LOTENSIN HCT Take 1 tablet by mouth daily.   clopidogrel 75 MG tablet Commonly known as:  PLAVIX Take 1 tablet (75 mg total) by mouth daily with breakfast. Start taking on:  04/26/2017   JALYN 0.5-0.4 MG Caps Generic drug:  Dutasteride-Tamsulosin HCl Take 1 tablet by mouth daily.   metoprolol tartrate 25 MG tablet Commonly known as:  LOPRESSOR Take 2 tablets (50 mg total) by mouth 3 (three) times daily. What changed:  when to take this   Rivaroxaban 15 MG Tabs tablet Commonly known as:  XARELTO Take 1 tablet (15 mg total) by mouth daily with supper. Start this evening      Follow-up Information  Adrian Prows, MD Follow up on 05/03/2017.   Specialty:  Cardiology Contact information: [address] [place] [address] [phone]           Signed: Adrian Prows 04/25/2017, 9:29 AM

## 2017-04-25 NOTE — Care Management Note (Signed)
Case Management Note  Patient Details  Name: Jeremy Macdonald MRN: [id number] Date of Birth: 10-20-1925  Subjective/Objective:   From home, s/p coronary stent intervention, will be on plavix.                  Action/Plan: NCM will follow for dc needs.   Expected Discharge Date:                  Expected Discharge Plan:  Home/Self Care  In-House Referral:     Discharge planning Services  CM Consult  Post Acute Care Choice:    Choice offered to:     DME Arranged:    DME Agency:     HH Arranged:    [place] Agency:     Status of Service:  Completed, signed off  If discussed at [employer] of Stay Meetings, dates discussed:    Additional Comments:  Zenon Mayo, RN 04/25/2017, 7:59 AM

## 2017-04-25 NOTE — Progress Notes (Signed)
CARDIAC REHAB PHASE I   PRE:  Rate/Rhythm: 100 a fib  BP:  Sitting: 144/76        SaO2: 94 RA  MODE:  Ambulation: 100 ft   POST:  Rate/Rhythm: 155 a fib  BP:  Sitting: 159/89         SaO2: 95 RA  Pt ambulated 100 ft on RA, gait belt, assist x1, mostly steady gait, tolerated fair.  Pt c/o mild DOE, HR elevated, a fib, denies cp, dizziness, declined rest stop. HR improved with rest. Completed PCI/stent education with pt and wife at bedside.  Reviewed risk factors, anti-platelet therapy, stent card, activity restrictions, ntg, exercise, heart healthy diet and phase 2 cardiac rehab. Pt and verbalized understanding. Pt agrees to phase 2 cardiac rehab referral, will send to [facility] per pt request. Pt to chair after walk, call bell within reach,wife at bedside.     [place], RN, BSN 04/25/2017 8:53 AM

## 2017-04-30 ENCOUNTER — Telehealth (HOSPITAL_COMMUNITY): Payer: Self-pay

## 2017-04-30 NOTE — Telephone Encounter (Signed)
Patient is insured through [employer] - Patient will need to call [employer] for benefits and eligibility for Cardiac Rehab.   Patient will be contacted and scheduled.

## 2017-05-03 DIAGNOSIS — I480 Paroxysmal atrial fibrillation: Secondary | ICD-10-CM | POA: Diagnosis not present

## 2017-05-03 DIAGNOSIS — I714 Abdominal aortic aneurysm, without rupture: Secondary | ICD-10-CM | POA: Diagnosis not present

## 2017-05-03 DIAGNOSIS — I25119 Atherosclerotic heart disease of native coronary artery with unspecified angina pectoris: Secondary | ICD-10-CM | POA: Diagnosis not present

## 2017-05-09 ENCOUNTER — Telehealth (HOSPITAL_COMMUNITY): Payer: Self-pay

## 2017-05-09 NOTE — Telephone Encounter (Signed)
Called and spoke with patient in regards to Cardiac Rehab - Patient is not interested right now but would like for me to call back mid January.

## 2017-05-22 DIAGNOSIS — I480 Paroxysmal atrial fibrillation: Secondary | ICD-10-CM | POA: Diagnosis not present

## 2017-06-04 DIAGNOSIS — I480 Paroxysmal atrial fibrillation: Secondary | ICD-10-CM | POA: Diagnosis not present

## 2017-06-04 DIAGNOSIS — I25119 Atherosclerotic heart disease of native coronary artery with unspecified angina pectoris: Secondary | ICD-10-CM | POA: Diagnosis not present

## 2017-06-08 DIAGNOSIS — M25531 Pain in right wrist: Secondary | ICD-10-CM | POA: Diagnosis not present

## 2017-06-15 DIAGNOSIS — I10 Essential (primary) hypertension: Secondary | ICD-10-CM | POA: Diagnosis not present

## 2017-06-15 DIAGNOSIS — H9192 Unspecified hearing loss, left ear: Secondary | ICD-10-CM | POA: Diagnosis not present

## 2017-06-15 DIAGNOSIS — J449 Chronic obstructive pulmonary disease, unspecified: Secondary | ICD-10-CM | POA: Diagnosis not present

## 2017-06-15 DIAGNOSIS — Z5181 Encounter for therapeutic drug level monitoring: Secondary | ICD-10-CM | POA: Diagnosis not present

## 2017-06-15 DIAGNOSIS — M25531 Pain in right wrist: Secondary | ICD-10-CM | POA: Diagnosis not present

## 2017-06-15 DIAGNOSIS — M1990 Unspecified osteoarthritis, unspecified site: Secondary | ICD-10-CM | POA: Diagnosis not present

## 2017-06-15 DIAGNOSIS — I739 Peripheral vascular disease, unspecified: Secondary | ICD-10-CM | POA: Diagnosis not present

## 2017-06-15 DIAGNOSIS — N400 Enlarged prostate without lower urinary tract symptoms: Secondary | ICD-10-CM | POA: Diagnosis not present

## 2017-06-15 DIAGNOSIS — I25119 Atherosclerotic heart disease of native coronary artery with unspecified angina pectoris: Secondary | ICD-10-CM | POA: Diagnosis not present

## 2017-06-15 DIAGNOSIS — I714 Abdominal aortic aneurysm, without rupture: Secondary | ICD-10-CM | POA: Diagnosis not present

## 2017-06-17 ENCOUNTER — Emergency Department (HOSPITAL_COMMUNITY): Payer: Medicare Other

## 2017-06-17 ENCOUNTER — Emergency Department (HOSPITAL_COMMUNITY)
Admission: EM | Admit: 2017-06-17 | Discharge: 2017-06-17 | Disposition: A | Discharge status: Home / Self Care | Payer: Medicare Other | Source: Home / Self Care | Attending: Emergency Medicine | Admitting: Emergency Medicine

## 2017-06-17 ENCOUNTER — Encounter (HOSPITAL_COMMUNITY): Payer: Self-pay | Admitting: Emergency Medicine

## 2017-06-17 DIAGNOSIS — S0122XA Laceration with foreign body of nose, initial encounter: Secondary | ICD-10-CM

## 2017-06-17 DIAGNOSIS — I10 Essential (primary) hypertension: Secondary | ICD-10-CM | POA: Insufficient documentation

## 2017-06-17 DIAGNOSIS — S8991XA Unspecified injury of right lower leg, initial encounter: Secondary | ICD-10-CM | POA: Diagnosis not present

## 2017-06-17 DIAGNOSIS — S022XXB Fracture of nasal bones, initial encounter for open fracture: Secondary | ICD-10-CM

## 2017-06-17 DIAGNOSIS — R51 Headache: Secondary | ICD-10-CM

## 2017-06-17 DIAGNOSIS — S8992XA Unspecified injury of left lower leg, initial encounter: Secondary | ICD-10-CM | POA: Diagnosis not present

## 2017-06-17 DIAGNOSIS — I4891 Unspecified atrial fibrillation: Secondary | ICD-10-CM

## 2017-06-17 DIAGNOSIS — Z23 Encounter for immunization: Secondary | ICD-10-CM | POA: Insufficient documentation

## 2017-06-17 DIAGNOSIS — Z7901 Long term (current) use of anticoagulants: Secondary | ICD-10-CM | POA: Insufficient documentation

## 2017-06-17 DIAGNOSIS — I2510 Atherosclerotic heart disease of native coronary artery without angina pectoris: Secondary | ICD-10-CM | POA: Insufficient documentation

## 2017-06-17 DIAGNOSIS — S199XXA Unspecified injury of neck, initial encounter: Secondary | ICD-10-CM | POA: Diagnosis not present

## 2017-06-17 DIAGNOSIS — S0083XA Contusion of other part of head, initial encounter: Secondary | ICD-10-CM

## 2017-06-17 DIAGNOSIS — S0993XA Unspecified injury of face, initial encounter: Secondary | ICD-10-CM

## 2017-06-17 DIAGNOSIS — M25561 Pain in right knee: Secondary | ICD-10-CM | POA: Diagnosis not present

## 2017-06-17 DIAGNOSIS — S6991XA Unspecified injury of right wrist, hand and finger(s), initial encounter: Secondary | ICD-10-CM | POA: Diagnosis not present

## 2017-06-17 DIAGNOSIS — J449 Chronic obstructive pulmonary disease, unspecified: Secondary | ICD-10-CM | POA: Insufficient documentation

## 2017-06-17 DIAGNOSIS — Y92009 Unspecified place in unspecified non-institutional (private) residence as the place of occurrence of the external cause: Secondary | ICD-10-CM

## 2017-06-17 DIAGNOSIS — S0121XA Laceration without foreign body of nose, initial encounter: Secondary | ICD-10-CM | POA: Diagnosis not present

## 2017-06-17 DIAGNOSIS — Y92481 Parking lot as the place of occurrence of the external cause: Secondary | ICD-10-CM

## 2017-06-17 DIAGNOSIS — F17210 Nicotine dependence, cigarettes, uncomplicated: Secondary | ICD-10-CM

## 2017-06-17 DIAGNOSIS — D62 Acute posthemorrhagic anemia: Secondary | ICD-10-CM | POA: Diagnosis not present

## 2017-06-17 DIAGNOSIS — M542 Cervicalgia: Secondary | ICD-10-CM | POA: Diagnosis not present

## 2017-06-17 DIAGNOSIS — I480 Paroxysmal atrial fibrillation: Secondary | ICD-10-CM | POA: Diagnosis not present

## 2017-06-17 DIAGNOSIS — S025XXA Fracture of tooth (traumatic), initial encounter for closed fracture: Secondary | ICD-10-CM | POA: Insufficient documentation

## 2017-06-17 DIAGNOSIS — E440 Moderate protein-calorie malnutrition: Secondary | ICD-10-CM | POA: Diagnosis not present

## 2017-06-17 DIAGNOSIS — Y9301 Activity, walking, marching and hiking: Secondary | ICD-10-CM | POA: Insufficient documentation

## 2017-06-17 DIAGNOSIS — S299XXA Unspecified injury of thorax, initial encounter: Secondary | ICD-10-CM | POA: Diagnosis not present

## 2017-06-17 DIAGNOSIS — Y999 Unspecified external cause status: Secondary | ICD-10-CM | POA: Insufficient documentation

## 2017-06-17 DIAGNOSIS — W010XXA Fall on same level from slipping, tripping and stumbling without subsequent striking against object, initial encounter: Secondary | ICD-10-CM

## 2017-06-17 DIAGNOSIS — W19XXXA Unspecified fall, initial encounter: Secondary | ICD-10-CM

## 2017-06-17 DIAGNOSIS — S0990XA Unspecified injury of head, initial encounter: Secondary | ICD-10-CM | POA: Diagnosis not present

## 2017-06-17 DIAGNOSIS — R531 Weakness: Secondary | ICD-10-CM | POA: Diagnosis not present

## 2017-06-17 DIAGNOSIS — M25562 Pain in left knee: Secondary | ICD-10-CM | POA: Diagnosis not present

## 2017-06-17 DIAGNOSIS — W101XXA Fall (on)(from) sidewalk curb, initial encounter: Secondary | ICD-10-CM

## 2017-06-17 DIAGNOSIS — R079 Chest pain, unspecified: Secondary | ICD-10-CM | POA: Diagnosis not present

## 2017-06-17 DIAGNOSIS — N179 Acute kidney failure, unspecified: Secondary | ICD-10-CM | POA: Diagnosis not present

## 2017-06-17 DIAGNOSIS — N138 Other obstructive and reflux uropathy: Secondary | ICD-10-CM | POA: Diagnosis not present

## 2017-06-17 HISTORY — DX: Unspecified fall, initial encounter: W19.XXXA

## 2017-06-17 HISTORY — DX: Unspecified place in unspecified non-institutional (private) residence as the place of occurrence of the external cause: Y92.009

## 2017-06-17 MED ORDER — BACITRACIN ZINC 500 UNIT/GM EX OINT
TOPICAL_OINTMENT | Freq: Two times a day (BID) | CUTANEOUS | Status: DC
  Filled 2017-06-17: qty 0.9

## 2017-06-17 MED ORDER — CEPHALEXIN 500 MG PO CAPS
500.0000 mg | ORAL_CAPSULE | Freq: Once | ORAL | Status: AC
  Administered 2017-06-17: 500 mg via ORAL
  Filled 2017-06-17: qty 1

## 2017-06-17 MED ORDER — TETANUS-DIPHTH-ACELL PERTUSSIS 5-2.5-18.5 LF-MCG/0.5 IM SUSP
0.5000 mL | Freq: Once | INTRAMUSCULAR | Status: AC
  Administered 2017-06-17: 0.5 mL via INTRAMUSCULAR
  Filled 2017-06-17: qty 0.5

## 2017-06-17 MED ORDER — ACETAMINOPHEN 500 MG PO TABS
1000.0000 mg | ORAL_TABLET | Freq: Once | ORAL | Status: AC
  Administered 2017-06-17: 1000 mg via ORAL
  Filled 2017-06-17: qty 2

## 2017-06-17 MED ORDER — CEPHALEXIN 500 MG PO CAPS: 500.0000 mg | ORAL_CAPSULE | Freq: Three times a day (TID) | ORAL | 0 refills | Status: DC

## 2017-06-17 MED ORDER — TRAMADOL HCL 50 MG PO TABS
50.0000 mg | ORAL_TABLET | Freq: Once | ORAL | Status: AC
  Administered 2017-06-17: 50 mg via ORAL
  Filled 2017-06-17: qty 1

## 2017-06-17 MED ORDER — LIDOCAINE-EPINEPHRINE (PF) 2 %-1:200000 IJ SOLN
INTRAMUSCULAR | Status: AC
  Filled 2017-06-17: qty 20

## 2017-06-17 MED ORDER — TRAMADOL HCL 50 MG PO TABS: 50.0000 mg | ORAL_TABLET | Freq: Four times a day (QID) | ORAL | 0 refills | Status: DC | PRN

## 2017-06-17 NOTE — Discharge Instructions (Signed)
It was our pleasure to provide your ER care today - we hope that you feel better.  Keep head elevated, even while sleeping, to help with swelling and pain.  Ice/coldpack to sore area.    Keep wounds very clean.  You may apply a very thin coat of bacitracin to areas for the next few days.   Have sutures removed, your doctor or urgent care, in 1 week.   For nasal fracture, follow up with ENT doctor in the coming week - see referral -  call office tomorrow to arrange appointment.  The ct scan shows a nasal fracture and small foreign bodies - we tried to remove the small foreign bodies.  Take keflex (antibiotic) as prescribed.   For dental injury, follow up with your dentist tomorrow - call office tomorrow AM.  Take acetaminophen as need for pain. You may also take ultram as need for pain - no driving when taking.   Use great care to avoid falls, especially as you are on a blood thinner. Consider using walker for added stability. Discuss plan of whether to continue blood thinner with your doctor this week.   Return to ER if worse, new or severe pain, infection of wound, other concern.

## 2017-06-17 NOTE — ED Triage Notes (Signed)
Patient BIB wife, c/o fall after trip onto concrete. C/o broken teeth, laceration to nose, bruising to left eye. Denies LOC. + blood thinners.

## 2017-06-17 NOTE — ED Notes (Signed)
ED Provider at bedside. 

## 2017-06-17 NOTE — ED Notes (Signed)
Patient transported to X-ray 

## 2017-06-17 NOTE — ED Provider Notes (Signed)
[facility] DEPT Provider Note   CSN: [id number] Arrival date & time: 06/17/17  1730     History   Chief Complaint Chief Complaint  Patient presents with  . Fall    HPI Jeremy Macdonald is a 84 y.o. male.  Patient s/p fall shortly pta today. Was walking from parking area back inside when tripped on curb and fell forward, hit head on ground. Dazed, no loc. Contusion and lacerations to face and mouth. Frontal headache post fall. Dull. Moderate. Denies neck or back pain. Unsure of last tetanus. Is on xarelto. Abrasions to knees and right hand. Otherwise denies other pain. No fever/chills. No chest pain or sob. No nv.    The history is provided by the patient.  Fall  Associated symptoms include headaches. Pertinent negatives include no chest pain, no abdominal pain and no shortness of breath.    Past Medical History:  Diagnosis Date  . Abdominal aortic aneurysm (AAA) ([place])   . Arthritis    "shoulders" (04/24/2017)  . Atrial fibrillation with RVR ([place]) 03/2017   [name] 03/08/2017  . COPD (chronic obstructive pulmonary disease) ([place])   . Coronary artery disease   . High cholesterol   . Hypertension   . Pneumonia ~ 2003-2008 X 5   "5 straight years S/P pneumonia shot in ~ 2003"(04/24/2017)  . Skin cancer 2018   "cut it out of right arm"    Patient Active Problem List   Diagnosis Date Noted  . Post PTCA 04/24/2017  . CAD (coronary artery disease), native coronary artery 04/22/2017  . Angina pectoris ([place]) 04/22/2017  . PAF (paroxysmal atrial fibrillation) ([place]) 03/18/2017  . Abnormal nuclear stress test 03/18/2017    Past Surgical History:  Procedure Laterality Date  . APPENDECTOMY    . CARDIAC CATHETERIZATION    . CATARACT EXTRACTION, BILATERAL Bilateral   . CORONARY ANGIOPLASTY WITH STENT PLACEMENT    . CORONARY ATHERECTOMY N/A 04/24/2017   Procedure: CORONARY ATHERECTOMY;  Surgeon: Adrian Prows, MD;  Location: [facility];   Service: Cardiovascular;  Laterality: N/A;  . CORONARY STENT INTERVENTION N/A 04/24/2017   Procedure: CORONARY STENT INTERVENTION;  Surgeon: Adrian Prows, MD;  Location: [facility];  Service: Cardiovascular;  Laterality: N/A;  . INCISION AND DRAINAGE  1950s   "RLE; got hurt in service; had to get a bunch of stuff out"  . LEFT HEART CATH AND CORONARY ANGIOGRAPHY N/A 03/19/2017   Procedure: LEFT HEART CATH AND CORONARY ANGIOGRAPHY;  Surgeon: Adrian Prows, MD;  Location: [facility];  Service: Cardiovascular;  Laterality: N/A;  . SKIN CANCER EXCISION Right 2018   "arms"  . TOOTH EXTRACTION         Home Medications    Prior to Admission medications   Medication Sig Start Date End Date Taking? Authorizing Provider  acetaminophen (TYLENOL) 325 MG tablet Take 325 mg daily as needed by mouth for moderate pain.     [provider]  atorvastatin (LIPITOR) 10 MG tablet Take 10 mg by mouth daily.    [provider]  benazepril-hydrochlorthiazide (LOTENSIN HCT) 20-25 MG tablet Take 1 tablet by mouth daily.    [provider]  clopidogrel (PLAVIX) 75 MG tablet Take 1 tablet (75 mg total) by mouth daily with breakfast. 04/26/17   Adrian Prows, MD  Dutasteride-Tamsulosin HCl (JALYN) 0.5-0.4 MG CAPS Take 1 tablet by mouth daily.    [provider]  metoprolol tartrate (LOPRESSOR) 25 MG tablet Take 2 tablets (  50 mg total) by mouth 3 (three) times daily. 04/25/17   Adrian Prows, MD  rivaroxaban (XARELTO) 15 MG TABS tablet Take 1 tablet (15 mg total) by mouth daily with supper. Start this evening 03/19/17   Adrian Prows, MD    Family History Family History  Problem Relation Age of Onset  . Heart attack Father   . Cancer Sister   . Lung cancer Brother     Social History Social History   Tobacco Use  . Smoking status: Current Some Day Smoker    Packs/day: 0.10    Years: 65.00    Pack years: 6.50    Types: Cigarettes  . Smokeless tobacco: Former Systems developer  .  Tobacco comment: 04/24/2017 "I smoke ~ 1pack cigarettes/month"  Substance Use Topics  . Alcohol use: No  . Drug use: No     Allergies   Phenergan [promethazine hcl]   Review of Systems Review of Systems  Constitutional: Negative for fever.  HENT: Negative for sore throat.   Eyes: Negative for redness.  Respiratory: Negative for shortness of breath.   Cardiovascular: Negative for chest pain.  Gastrointestinal: Negative for abdominal pain and vomiting.  Genitourinary: Negative for flank pain.  Musculoskeletal: Negative for back pain and neck pain.  Skin: Positive for wound.  Neurological: Positive for headaches. Negative for weakness and numbness.  Hematological: Bruises/bleeds easily.  Psychiatric/Behavioral: Negative for confusion.     Physical Exam Updated Vital Signs BP (!) 156/93 (BP Location: Right Arm)   Pulse (!) 55   Resp 20   SpO2 93%   Physical Exam  Constitutional: He is oriented to person, place, and time. He appears well-developed and well-nourished. No distress.  HENT:  Mouth/Throat: Oropharynx is clear and moist.  Contusions to face. Nasal laceration with suspected nasal bone fracture. Teeth 8 and 9 are chipped/fractured. Teeth are firmly intact. Bruising/swelling to chin/anterior mandible.   Eyes: Conjunctivae are normal. Pupils are equal, round, and reactive to light.  Neck: Neck supple. No tracheal deviation present.  Cardiovascular: Normal rate, regular rhythm, normal heart sounds and intact distal pulses.  Pulmonary/Chest: Effort normal and breath sounds normal. No accessory muscle usage. No respiratory distress. He exhibits no tenderness.  Abdominal: Soft. Bowel sounds are normal. He exhibits no distension. There is no tenderness.  Genitourinary:  Genitourinary Comments: No cva tenderness  Musculoskeletal: He exhibits no edema.  Mid cervical mild tenderness, otherwise, CTLS spine, non tender, aligned, no step off. Tenderness right hand. Superficial  abrasions to knees. Good rom bil knees without pain. No other focal bony tenderness noted.   Neurological: He is alert and oriented to person, place, and time.  Speech clear. Motor intact bil, stre 5/5. sens grossly intact.   Skin: Skin is warm and dry. He is not diaphoretic.  Psychiatric: He has a normal mood and affect.  Nursing note and vitals reviewed.    ED Treatments / Results  Labs (all labs ordered are listed, but only abnormal results are displayed) Labs Reviewed - No data to display  EKG  EKG Interpretation None       Radiology Ct Head Wo Contrast  Result Date: 06/17/2017 CLINICAL DATA:  [place] today and struck head and face. EXAM: CT HEAD WITHOUT CONTRAST CT MAXILLOFACIAL WITHOUT CONTRAST CT CERVICAL SPINE WITHOUT CONTRAST TECHNIQUE: Multidetector CT imaging of the head, cervical spine, and maxillofacial structures were performed using the standard protocol without intravenous contrast. Multiplanar CT image reconstructions of the cervical spine and maxillofacial structures were also generated. COMPARISON:  08/15/2009 head CT FINDINGS: CT HEAD FINDINGS Brain: Progressive age related cerebral atrophy, ventriculomegaly and periventricular white matter disease. No extra-axial fluid collections are identified. No CT findings for acute hemispheric infarction or intracranial hemorrhage. No mass lesions. The brainstem and cerebellum are normal. Vascular: No hyperdense vessels or aneurysm. Stable vascular calcifications. Skull: No acute skull fracture or bone lesion. Other: No scalp laceration or hematoma. CT MAXILLOFACIAL FINDINGS Osseous: Small fractures involving the left nasal bone and bony is a bridge. There also multiple lacerations and radiopaque foreign bodies in the soft tissues overlying the nose. The bony nasal septum is intact. No other acute facial bone fractures are identified. The mandibular condyles are normally located. No mandible fracture. Torus mandibularis noted  incidentally. Orbits: The orbital bones are intact.  The globes are intact. Sinuses: Mucous retention cysts or possible polyps in the maxillary sinuses. The other paranasal sinuses are clear and the mastoid air cells and middle ear cavities are clear. Soft tissues: Soft tissue swelling and lacerations involving the nose. CT CERVICAL SPINE FINDINGS Alignment: Normal Skull base and vertebrae: No acute fracture. Soft tissues and spinal canal: No prevertebral fluid or swelling. No visible canal hematoma. Disc levels: No significant disc protrusions, spinal or foraminal stenosis. Upper chest: The lung apices are grossly clear. Other: Extensive carotid artery calcifications. IMPRESSION: 1. Progressive cerebral atrophy, ventriculomegaly and periventricular white matter disease. 2. No new/acute intracranial findings or skull fracture. 3. Small nasal bone fractures and multiple lacerations and small radiopaque foreign bodies involving the soft tissues of the nose. 4. No acute cervical spine fracture. Electronically Signed   By: Marijo Sanes M.D.   On: 06/17/2017 18:53   Ct Cervical Spine Wo Contrast  Result Date: 06/17/2017 CLINICAL DATA:  [place] today and struck head and face. EXAM: CT HEAD WITHOUT CONTRAST CT MAXILLOFACIAL WITHOUT CONTRAST CT CERVICAL SPINE WITHOUT CONTRAST TECHNIQUE: Multidetector CT imaging of the head, cervical spine, and maxillofacial structures were performed using the standard protocol without intravenous contrast. Multiplanar CT image reconstructions of the cervical spine and maxillofacial structures were also generated. COMPARISON:  08/15/2009 head CT FINDINGS: CT HEAD FINDINGS Brain: Progressive age related cerebral atrophy, ventriculomegaly and periventricular white matter disease. No extra-axial fluid collections are identified. No CT findings for acute hemispheric infarction or intracranial hemorrhage. No mass lesions. The brainstem and cerebellum are normal. Vascular: No hyperdense vessels  or aneurysm. Stable vascular calcifications. Skull: No acute skull fracture or bone lesion. Other: No scalp laceration or hematoma. CT MAXILLOFACIAL FINDINGS Osseous: Small fractures involving the left nasal bone and bony is a bridge. There also multiple lacerations and radiopaque foreign bodies in the soft tissues overlying the nose. The bony nasal septum is intact. No other acute facial bone fractures are identified. The mandibular condyles are normally located. No mandible fracture. Torus mandibularis noted incidentally. Orbits: The orbital bones are intact.  The globes are intact. Sinuses: Mucous retention cysts or possible polyps in the maxillary sinuses. The other paranasal sinuses are clear and the mastoid air cells and middle ear cavities are clear. Soft tissues: Soft tissue swelling and lacerations involving the nose. CT CERVICAL SPINE FINDINGS Alignment: Normal Skull base and vertebrae: No acute fracture. Soft tissues and spinal canal: No prevertebral fluid or swelling. No visible canal hematoma. Disc levels: No significant disc protrusions, spinal or foraminal stenosis. Upper chest: The lung apices are grossly clear. Other: Extensive carotid artery calcifications. IMPRESSION: 1. Progressive cerebral atrophy, ventriculomegaly and periventricular white matter disease. 2. No new/acute intracranial findings or skull fracture.  3. Small nasal bone fractures and multiple lacerations and small radiopaque foreign bodies involving the soft tissues of the nose. 4. No acute cervical spine fracture. Electronically Signed   By: Marijo Sanes M.D.   On: 06/17/2017 18:53   Dg Hand Complete Right  Result Date: 06/17/2017 CLINICAL DATA:  Posterior right hand laceration following a fall on concrete today. EXAM: RIGHT HAND - COMPLETE 3+ VIEW COMPARISON:  Right wrist dated 06/08/2017 and right hand dated 05/26/2013. FINDINGS: Degenerative changes at the articulation of the trapezium and trapezoid with the scaphoid. No  fracture or dislocation seen. IMPRESSION: 1. No fracture. 2. Stable degenerative changes at the articulation of the trapezium and trapezoid with the scaphoid. Electronically Signed   By: Claudie Revering M.D.   On: 06/17/2017 18:24   Ct Maxillofacial Wo Contrast  Result Date: 06/17/2017 CLINICAL DATA:  [place] today and struck head and face. EXAM: CT HEAD WITHOUT CONTRAST CT MAXILLOFACIAL WITHOUT CONTRAST CT CERVICAL SPINE WITHOUT CONTRAST TECHNIQUE: Multidetector CT imaging of the head, cervical spine, and maxillofacial structures were performed using the standard protocol without intravenous contrast. Multiplanar CT image reconstructions of the cervical spine and maxillofacial structures were also generated. COMPARISON:  08/15/2009 head CT FINDINGS: CT HEAD FINDINGS Brain: Progressive age related cerebral atrophy, ventriculomegaly and periventricular white matter disease. No extra-axial fluid collections are identified. No CT findings for acute hemispheric infarction or intracranial hemorrhage. No mass lesions. The brainstem and cerebellum are normal. Vascular: No hyperdense vessels or aneurysm. Stable vascular calcifications. Skull: No acute skull fracture or bone lesion. Other: No scalp laceration or hematoma. CT MAXILLOFACIAL FINDINGS Osseous: Small fractures involving the left nasal bone and bony is a bridge. There also multiple lacerations and radiopaque foreign bodies in the soft tissues overlying the nose. The bony nasal septum is intact. No other acute facial bone fractures are identified. The mandibular condyles are normally located. No mandible fracture. Torus mandibularis noted incidentally. Orbits: The orbital bones are intact.  The globes are intact. Sinuses: Mucous retention cysts or possible polyps in the maxillary sinuses. The other paranasal sinuses are clear and the mastoid air cells and middle ear cavities are clear. Soft tissues: Soft tissue swelling and lacerations involving the nose. CT CERVICAL  SPINE FINDINGS Alignment: Normal Skull base and vertebrae: No acute fracture. Soft tissues and spinal canal: No prevertebral fluid or swelling. No visible canal hematoma. Disc levels: No significant disc protrusions, spinal or foraminal stenosis. Upper chest: The lung apices are grossly clear. Other: Extensive carotid artery calcifications. IMPRESSION: 1. Progressive cerebral atrophy, ventriculomegaly and periventricular white matter disease. 2. No new/acute intracranial findings or skull fracture. 3. Small nasal bone fractures and multiple lacerations and small radiopaque foreign bodies involving the soft tissues of the nose. 4. No acute cervical spine fracture. Electronically Signed   By: Marijo Sanes M.D.   On: 06/17/2017 18:53    Procedures .Marland KitchenLaceration Repair Date/Time: 06/17/2017 6:07 PM Performed by: Lajean Saver, MD Authorized by: Lajean Saver, MD   Consent:    Risks discussed:  Infection Anesthesia (see MAR for exact dosages):    Anesthesia method:  Local infiltration   Local anesthetic:  Lidocaine 2% WITH epi Laceration details:    Location:  Face   Face location:  Nose   Length (cm):  2 Repair type:    Repair type:  Intermediate Pre-procedure details:    Preparation:  Patient was prepped and draped in usual sterile fashion Exploration:    Wound exploration: entire depth of wound probed and visualized  Contaminated: yes   Treatment:    Area cleansed with:  Betadine   Amount of cleaning:  Extensive   Irrigation solution:  Sterile saline   Irrigation method:  Syringe   Visualized foreign bodies/material removed: yes   Skin repair:    Repair method:  Sutures   Suture size:  5-0   Suture material:  Prolene   Suture technique:  Simple interrupted   Number of sutures:  6 Post-procedure details:    Patient tolerance of procedure:  Tolerated well, no immediate complications   (including critical care time)  Medications Ordered in ED Medications    lidocaine-EPINEPHrine (XYLOCAINE W/EPI) 2 %-1:200000 (PF) injection (not administered)  Tdap (BOOSTRIX) injection 0.5 mL (not administered)     Initial Impression / Assessment and Plan / ED Course  I have reviewed the triage vital signs and the nursing notes.  Pertinent labs & imaging results that were available during my care of the patient were reviewed by me and considered in my medical decision making (see chart for details).  Imaging studies ordered.  Tetanus im.   Reviewed nursing notes and prior charts for additional history.   Post ct, 2 tiny fbs removed, no other fbs seen or felt in wound. Wound sutured.   Other abrasions cleaned, bacitracin and sterile dressing.   icepack to sore area.  Acetaminophen and ultram for pain.   Keflex po.   Discussed injuries w pt/spouse.   Pt has dentist, and indicates will call there tomorrow AM.    Final Clinical Impressions(s) / ED Diagnoses   Final diagnoses:  None    ED Discharge Orders    None       Lajean Saver, MD 06/17/17 1916

## 2017-06-20 ENCOUNTER — Encounter (HOSPITAL_COMMUNITY): Payer: Self-pay | Admitting: *Deleted

## 2017-06-20 ENCOUNTER — Inpatient Hospital Stay (HOSPITAL_COMMUNITY)
Admission: EM | Admit: 2017-06-20 | Discharge: 2017-06-25 | DRG: 309 | Disposition: A | Discharge status: Rehabilitation Facility | Payer: Medicare Other | Attending: Internal Medicine | Admitting: Internal Medicine

## 2017-06-20 ENCOUNTER — Other Ambulatory Visit: Payer: Self-pay

## 2017-06-20 ENCOUNTER — Emergency Department (HOSPITAL_COMMUNITY): Payer: Medicare Other

## 2017-06-20 ENCOUNTER — Telehealth (HOSPITAL_COMMUNITY): Payer: Self-pay

## 2017-06-20 DIAGNOSIS — N401 Enlarged prostate with lower urinary tract symptoms: Secondary | ICD-10-CM | POA: Diagnosis not present

## 2017-06-20 DIAGNOSIS — R739 Hyperglycemia, unspecified: Secondary | ICD-10-CM | POA: Diagnosis present

## 2017-06-20 DIAGNOSIS — W19XXXD Unspecified fall, subsequent encounter: Secondary | ICD-10-CM | POA: Diagnosis not present

## 2017-06-20 DIAGNOSIS — H9193 Unspecified hearing loss, bilateral: Secondary | ICD-10-CM

## 2017-06-20 DIAGNOSIS — Z23 Encounter for immunization: Secondary | ICD-10-CM

## 2017-06-20 DIAGNOSIS — R51 Headache: Secondary | ICD-10-CM | POA: Diagnosis present

## 2017-06-20 DIAGNOSIS — I4891 Unspecified atrial fibrillation: Secondary | ICD-10-CM | POA: Diagnosis present

## 2017-06-20 DIAGNOSIS — Z85828 Personal history of other malignant neoplasm of skin: Secondary | ICD-10-CM

## 2017-06-20 DIAGNOSIS — Z888 Allergy status to other drugs, medicaments and biological substances status: Secondary | ICD-10-CM | POA: Diagnosis not present

## 2017-06-20 DIAGNOSIS — R296 Repeated falls: Secondary | ICD-10-CM | POA: Diagnosis present

## 2017-06-20 DIAGNOSIS — E8809 Other disorders of plasma-protein metabolism, not elsewhere classified: Secondary | ICD-10-CM | POA: Diagnosis not present

## 2017-06-20 DIAGNOSIS — M542 Cervicalgia: Secondary | ICD-10-CM | POA: Diagnosis not present

## 2017-06-20 DIAGNOSIS — I129 Hypertensive chronic kidney disease with stage 1 through stage 4 chronic kidney disease, or unspecified chronic kidney disease: Secondary | ICD-10-CM | POA: Diagnosis present

## 2017-06-20 DIAGNOSIS — W101XXA Fall (on)(from) sidewalk curb, initial encounter: Secondary | ICD-10-CM | POA: Diagnosis not present

## 2017-06-20 DIAGNOSIS — R627 Adult failure to thrive: Secondary | ICD-10-CM | POA: Diagnosis present

## 2017-06-20 DIAGNOSIS — I482 Chronic atrial fibrillation: Secondary | ICD-10-CM | POA: Diagnosis not present

## 2017-06-20 DIAGNOSIS — F17210 Nicotine dependence, cigarettes, uncomplicated: Secondary | ICD-10-CM | POA: Diagnosis present

## 2017-06-20 DIAGNOSIS — J449 Chronic obstructive pulmonary disease, unspecified: Secondary | ICD-10-CM | POA: Diagnosis not present

## 2017-06-20 DIAGNOSIS — I2510 Atherosclerotic heart disease of native coronary artery without angina pectoris: Secondary | ICD-10-CM | POA: Diagnosis present

## 2017-06-20 DIAGNOSIS — M25562 Pain in left knee: Secondary | ICD-10-CM | POA: Diagnosis not present

## 2017-06-20 DIAGNOSIS — S8991XA Unspecified injury of right lower leg, initial encounter: Secondary | ICD-10-CM | POA: Diagnosis not present

## 2017-06-20 DIAGNOSIS — Z79899 Other long term (current) drug therapy: Secondary | ICD-10-CM | POA: Diagnosis not present

## 2017-06-20 DIAGNOSIS — I480 Paroxysmal atrial fibrillation: Secondary | ICD-10-CM | POA: Diagnosis present

## 2017-06-20 DIAGNOSIS — R319 Hematuria, unspecified: Secondary | ICD-10-CM | POA: Diagnosis present

## 2017-06-20 DIAGNOSIS — N183 Chronic kidney disease, stage 3 (moderate): Secondary | ICD-10-CM | POA: Diagnosis present

## 2017-06-20 DIAGNOSIS — I4892 Unspecified atrial flutter: Secondary | ICD-10-CM | POA: Diagnosis not present

## 2017-06-20 DIAGNOSIS — E7800 Pure hypercholesterolemia, unspecified: Secondary | ICD-10-CM | POA: Diagnosis present

## 2017-06-20 DIAGNOSIS — Z7902 Long term (current) use of antithrombotics/antiplatelets: Secondary | ICD-10-CM | POA: Diagnosis not present

## 2017-06-20 DIAGNOSIS — E785 Hyperlipidemia, unspecified: Secondary | ICD-10-CM | POA: Diagnosis present

## 2017-06-20 DIAGNOSIS — R404 Transient alteration of awareness: Secondary | ICD-10-CM | POA: Diagnosis not present

## 2017-06-20 DIAGNOSIS — S199XXA Unspecified injury of neck, initial encounter: Secondary | ICD-10-CM | POA: Diagnosis not present

## 2017-06-20 DIAGNOSIS — N138 Other obstructive and reflux uropathy: Secondary | ICD-10-CM | POA: Diagnosis present

## 2017-06-20 DIAGNOSIS — Y92481 Parking lot as the place of occurrence of the external cause: Secondary | ICD-10-CM | POA: Diagnosis not present

## 2017-06-20 DIAGNOSIS — Z8249 Family history of ischemic heart disease and other diseases of the circulatory system: Secondary | ICD-10-CM | POA: Diagnosis not present

## 2017-06-20 DIAGNOSIS — R34 Anuria and oliguria: Secondary | ICD-10-CM | POA: Diagnosis not present

## 2017-06-20 DIAGNOSIS — S069X1S Unspecified intracranial injury with loss of consciousness of 30 minutes or less, sequela: Secondary | ICD-10-CM | POA: Diagnosis not present

## 2017-06-20 DIAGNOSIS — G4701 Insomnia due to medical condition: Secondary | ICD-10-CM | POA: Diagnosis not present

## 2017-06-20 DIAGNOSIS — R079 Chest pain, unspecified: Secondary | ICD-10-CM | POA: Diagnosis not present

## 2017-06-20 DIAGNOSIS — Z9841 Cataract extraction status, right eye: Secondary | ICD-10-CM | POA: Diagnosis not present

## 2017-06-20 DIAGNOSIS — S0990XA Unspecified injury of head, initial encounter: Secondary | ICD-10-CM | POA: Diagnosis not present

## 2017-06-20 DIAGNOSIS — I714 Abdominal aortic aneurysm, without rupture: Secondary | ICD-10-CM | POA: Diagnosis present

## 2017-06-20 DIAGNOSIS — S025XXA Fracture of tooth (traumatic), initial encounter for closed fracture: Secondary | ICD-10-CM | POA: Diagnosis present

## 2017-06-20 DIAGNOSIS — E860 Dehydration: Secondary | ICD-10-CM | POA: Diagnosis present

## 2017-06-20 DIAGNOSIS — R338 Other retention of urine: Secondary | ICD-10-CM | POA: Diagnosis not present

## 2017-06-20 DIAGNOSIS — K7200 Acute and subacute hepatic failure without coma: Secondary | ICD-10-CM | POA: Diagnosis not present

## 2017-06-20 DIAGNOSIS — R5381 Other malaise: Secondary | ICD-10-CM | POA: Diagnosis not present

## 2017-06-20 DIAGNOSIS — Y9301 Activity, walking, marching and hiking: Secondary | ICD-10-CM | POA: Diagnosis present

## 2017-06-20 DIAGNOSIS — R531 Weakness: Secondary | ICD-10-CM | POA: Diagnosis not present

## 2017-06-20 DIAGNOSIS — N179 Acute kidney failure, unspecified: Secondary | ICD-10-CM | POA: Diagnosis present

## 2017-06-20 DIAGNOSIS — M179 Osteoarthritis of knee, unspecified: Secondary | ICD-10-CM | POA: Diagnosis not present

## 2017-06-20 DIAGNOSIS — E440 Moderate protein-calorie malnutrition: Secondary | ICD-10-CM | POA: Diagnosis not present

## 2017-06-20 DIAGNOSIS — M170 Bilateral primary osteoarthritis of knee: Secondary | ICD-10-CM | POA: Diagnosis present

## 2017-06-20 DIAGNOSIS — Z801 Family history of malignant neoplasm of trachea, bronchus and lung: Secondary | ICD-10-CM | POA: Diagnosis not present

## 2017-06-20 DIAGNOSIS — D62 Acute posthemorrhagic anemia: Secondary | ICD-10-CM | POA: Diagnosis present

## 2017-06-20 DIAGNOSIS — H9190 Unspecified hearing loss, unspecified ear: Secondary | ICD-10-CM | POA: Diagnosis present

## 2017-06-20 DIAGNOSIS — Z87820 Personal history of traumatic brain injury: Secondary | ICD-10-CM | POA: Diagnosis not present

## 2017-06-20 DIAGNOSIS — W19XXXA Unspecified fall, initial encounter: Secondary | ICD-10-CM

## 2017-06-20 DIAGNOSIS — S8992XA Unspecified injury of left lower leg, initial encounter: Secondary | ICD-10-CM | POA: Diagnosis not present

## 2017-06-20 DIAGNOSIS — Z7901 Long term (current) use of anticoagulants: Secondary | ICD-10-CM | POA: Diagnosis not present

## 2017-06-20 DIAGNOSIS — Z955 Presence of coronary angioplasty implant and graft: Secondary | ICD-10-CM

## 2017-06-20 DIAGNOSIS — N400 Enlarged prostate without lower urinary tract symptoms: Secondary | ICD-10-CM | POA: Diagnosis present

## 2017-06-20 DIAGNOSIS — M25561 Pain in right knee: Secondary | ICD-10-CM | POA: Diagnosis not present

## 2017-06-20 DIAGNOSIS — S299XXA Unspecified injury of thorax, initial encounter: Secondary | ICD-10-CM | POA: Diagnosis not present

## 2017-06-20 DIAGNOSIS — S0083XA Contusion of other part of head, initial encounter: Secondary | ICD-10-CM | POA: Diagnosis not present

## 2017-06-20 DIAGNOSIS — E876 Hypokalemia: Secondary | ICD-10-CM | POA: Diagnosis present

## 2017-06-20 DIAGNOSIS — F0390 Unspecified dementia without behavioral disturbance: Secondary | ICD-10-CM | POA: Diagnosis not present

## 2017-06-20 DIAGNOSIS — Z6824 Body mass index (BMI) 24.0-24.9, adult: Secondary | ICD-10-CM

## 2017-06-20 DIAGNOSIS — I25110 Atherosclerotic heart disease of native coronary artery with unstable angina pectoris: Secondary | ICD-10-CM | POA: Diagnosis not present

## 2017-06-20 DIAGNOSIS — Z9842 Cataract extraction status, left eye: Secondary | ICD-10-CM | POA: Diagnosis not present

## 2017-06-20 DIAGNOSIS — R339 Retention of urine, unspecified: Secondary | ICD-10-CM | POA: Diagnosis not present

## 2017-06-20 DIAGNOSIS — R42 Dizziness and giddiness: Secondary | ICD-10-CM

## 2017-06-20 DIAGNOSIS — D72829 Elevated white blood cell count, unspecified: Secondary | ICD-10-CM | POA: Diagnosis not present

## 2017-06-20 DIAGNOSIS — S022XXB Fracture of nasal bones, initial encounter for open fracture: Secondary | ICD-10-CM | POA: Diagnosis not present

## 2017-06-20 DIAGNOSIS — F05 Delirium due to known physiological condition: Secondary | ICD-10-CM | POA: Diagnosis not present

## 2017-06-20 DIAGNOSIS — R310 Gross hematuria: Secondary | ICD-10-CM | POA: Diagnosis not present

## 2017-06-20 DIAGNOSIS — I6529 Occlusion and stenosis of unspecified carotid artery: Secondary | ICD-10-CM | POA: Diagnosis present

## 2017-06-20 DIAGNOSIS — I481 Persistent atrial fibrillation: Secondary | ICD-10-CM | POA: Diagnosis not present

## 2017-06-20 DIAGNOSIS — R52 Pain, unspecified: Secondary | ICD-10-CM | POA: Diagnosis not present

## 2017-06-20 DIAGNOSIS — I951 Orthostatic hypotension: Secondary | ICD-10-CM | POA: Diagnosis present

## 2017-06-20 HISTORY — DX: Unspecified hearing loss, unspecified ear: H91.90

## 2017-06-20 HISTORY — DX: Unspecified place in unspecified non-institutional (private) residence as the place of occurrence of the external cause: Y92.009

## 2017-06-20 HISTORY — DX: Unspecified fall, initial encounter: W19.XXXA

## 2017-06-20 HISTORY — DX: Unspecified dementia, unspecified severity, without behavioral disturbance, psychotic disturbance, mood disturbance, and anxiety: F03.90

## 2017-06-20 LAB — CBC WITH DIFFERENTIAL/PLATELET
BASOS PCT: 0 %
Basophils Absolute: 0 10*3/uL (ref 0.0–0.1)
EOS ABS: 0 10*3/uL (ref 0.0–0.7)
Eosinophils Relative: 0 %
HEMATOCRIT: 42.8 % (ref 39.0–52.0)
HEMOGLOBIN: 13.6 g/dL (ref 13.0–17.0)
Lymphocytes Relative: 7 %
Lymphs Abs: 0.8 10*3/uL (ref 0.7–4.0)
MCH: 30.9 pg (ref 26.0–34.0)
MCHC: 31.8 g/dL (ref 30.0–36.0)
MCV: 97.3 fL (ref 78.0–100.0)
MONOS PCT: 8 %
Monocytes Absolute: 0.9 10*3/uL (ref 0.1–1.0)
NEUTROS ABS: 9.4 10*3/uL — AB (ref 1.7–7.7)
NEUTROS PCT: 85 %
Platelets: 149 10*3/uL — ABNORMAL LOW (ref 150–400)
RBC: 4.4 MIL/uL (ref 4.22–5.81)
RDW: 14.7 % (ref 11.5–15.5)
WBC: 11.2 10*3/uL — ABNORMAL HIGH (ref 4.0–10.5)

## 2017-06-20 LAB — URINALYSIS, ROUTINE W REFLEX MICROSCOPIC
Bilirubin Urine: NEGATIVE
GLUCOSE, UA: NEGATIVE mg/dL
KETONES UR: 20 mg/dL — AB
Leukocytes, UA: NEGATIVE
Nitrite: NEGATIVE
PH: 6 (ref 5.0–8.0)
PROTEIN: NEGATIVE mg/dL
Specific Gravity, Urine: 1.013 (ref 1.005–1.030)

## 2017-06-20 LAB — TROPONIN I: Troponin I: 0.03 ng/mL (ref <0.03)

## 2017-06-20 LAB — COMPREHENSIVE METABOLIC PANEL
ALBUMIN: 2.8 g/dL — AB (ref 3.5–5.0)
ALK PHOS: 68 U/L (ref 38–126)
ALT: 15 U/L — ABNORMAL LOW (ref 17–63)
ANION GAP: 13 (ref 5–15)
AST: 16 U/L (ref 15–41)
BILIRUBIN TOTAL: 1.9 mg/dL — AB (ref 0.3–1.2)
BUN: 13 mg/dL (ref 6–20)
CO2: 23 mmol/L (ref 22–32)
Calcium: 8.7 mg/dL — ABNORMAL LOW (ref 8.9–10.3)
Chloride: 99 mmol/L — ABNORMAL LOW (ref 101–111)
Creatinine, Ser: 1.07 mg/dL (ref 0.61–1.24)
GFR calc Af Amer: >60 mL/min (ref >60)
GFR calc non Af Amer: 59 mL/min — ABNORMAL LOW (ref >60)
GLUCOSE: 116 mg/dL — AB (ref 65–99)
POTASSIUM: 4 mmol/L (ref 3.5–5.1)
SODIUM: 135 mmol/L (ref 135–145)
TOTAL PROTEIN: 6.3 g/dL — AB (ref 6.5–8.1)

## 2017-06-20 LAB — I-STAT TROPONIN, ED: Troponin i, poc: 0.02 ng/mL (ref 0.00–0.08)

## 2017-06-20 LAB — CK: Total CK: 90 U/L (ref 49–397)

## 2017-06-20 LAB — BRAIN NATRIURETIC PEPTIDE: B NATRIURETIC PEPTIDE 5: 806.8 pg/mL — AB (ref 0.0–100.0)

## 2017-06-20 LAB — MRSA PCR SCREENING: MRSA by PCR: NEGATIVE

## 2017-06-20 MED ORDER — AMIODARONE HCL IN DEXTROSE 360-4.14 MG/200ML-% IV SOLN
60.0000 mg/h | INTRAVENOUS | Status: AC
  Administered 2017-06-20 (×2): 60 mg/h via INTRAVENOUS
  Filled 2017-06-20 (×2): qty 200

## 2017-06-20 MED ORDER — ACETAMINOPHEN 325 MG PO TABS: 325.0000 mg | ORAL_TABLET | Freq: Every day | ORAL | Status: DC | PRN

## 2017-06-20 MED ORDER — TRAMADOL HCL 50 MG PO TABS
50.0000 mg | ORAL_TABLET | Freq: Four times a day (QID) | ORAL | Status: DC | PRN
  Administered 2017-06-21 – 2017-06-25 (×7): 50 mg via ORAL
  Filled 2017-06-20 (×7): qty 1

## 2017-06-20 MED ORDER — METOPROLOL TARTRATE 50 MG PO TABS
50.0000 mg | ORAL_TABLET | Freq: Two times a day (BID) | ORAL | Status: DC
  Administered 2017-06-20 – 2017-06-25 (×10): 50 mg via ORAL
  Filled 2017-06-20 (×11): qty 1

## 2017-06-20 MED ORDER — ATORVASTATIN CALCIUM 10 MG PO TABS
10.0000 mg | ORAL_TABLET | Freq: Every day | ORAL | Status: DC
  Administered 2017-06-20 – 2017-06-24 (×5): 10 mg via ORAL
  Filled 2017-06-20 (×5): qty 1

## 2017-06-20 MED ORDER — TAMSULOSIN HCL 0.4 MG PO CAPS
0.4000 mg | ORAL_CAPSULE | Freq: Every day | ORAL | Status: DC
  Administered 2017-06-20 – 2017-06-25 (×6): 0.4 mg via ORAL
  Filled 2017-06-20 (×6): qty 1

## 2017-06-20 MED ORDER — RIVAROXABAN 15 MG PO TABS: 15.0000 mg | ORAL_TABLET | Freq: Every day | ORAL | Status: DC

## 2017-06-20 MED ORDER — MORPHINE SULFATE (PF) 4 MG/ML IV SOLN
4.0000 mg | Freq: Once | INTRAVENOUS | Status: AC
  Administered 2017-06-20: 4 mg via INTRAVENOUS
  Filled 2017-06-20: qty 1

## 2017-06-20 MED ORDER — DUTASTERIDE-TAMSULOSIN HCL 0.5-0.4 MG PO CAPS: 1.0000 | ORAL_CAPSULE | Freq: Every day | ORAL | Status: DC

## 2017-06-20 MED ORDER — ACETAMINOPHEN 325 MG PO TABS
650.0000 mg | ORAL_TABLET | ORAL | Status: DC | PRN
  Administered 2017-06-25: 650 mg via ORAL
  Filled 2017-06-20: qty 2

## 2017-06-20 MED ORDER — DILTIAZEM LOAD VIA INFUSION
10.0000 mg | Freq: Once | INTRAVENOUS | Status: AC
  Administered 2017-06-20: 10 mg via INTRAVENOUS
  Filled 2017-06-20: qty 10

## 2017-06-20 MED ORDER — DILTIAZEM HCL-DEXTROSE 100-5 MG/100ML-% IV SOLN (PREMIX)
5.0000 mg/h | INTRAVENOUS | Status: DC
  Administered 2017-06-20: 5 mg/h via INTRAVENOUS
  Administered 2017-06-20 – 2017-06-21 (×2): 15 mg/h via INTRAVENOUS
  Filled 2017-06-20 (×4): qty 100

## 2017-06-20 MED ORDER — ONDANSETRON HCL 4 MG/2ML IJ SOLN: 4.0000 mg | Freq: Four times a day (QID) | INTRAMUSCULAR | Status: DC | PRN

## 2017-06-20 MED ORDER — SODIUM CHLORIDE 0.9 % IV SOLN
INTRAVENOUS | Status: AC
  Administered 2017-06-20: 18:00:00 via INTRAVENOUS

## 2017-06-20 MED ORDER — AMIODARONE LOAD VIA INFUSION
150.0000 mg | Freq: Once | INTRAVENOUS | Status: AC
  Administered 2017-06-20: 150 mg via INTRAVENOUS
  Filled 2017-06-20: qty 83.34

## 2017-06-20 MED ORDER — FUROSEMIDE 10 MG/ML IJ SOLN
40.0000 mg | Freq: Once | INTRAMUSCULAR | Status: AC
  Administered 2017-06-20: 40 mg via INTRAVENOUS
  Filled 2017-06-20: qty 4

## 2017-06-20 MED ORDER — RIVAROXABAN 15 MG PO TABS
15.0000 mg | ORAL_TABLET | Freq: Every day | ORAL | Status: DC
  Administered 2017-06-20 – 2017-06-24 (×5): 15 mg via ORAL
  Filled 2017-06-20 (×6): qty 1

## 2017-06-20 MED ORDER — DUTASTERIDE 0.5 MG PO CAPS
0.5000 mg | ORAL_CAPSULE | Freq: Every day | ORAL | Status: DC
  Administered 2017-06-20 – 2017-06-25 (×6): 0.5 mg via ORAL
  Filled 2017-06-20 (×6): qty 1

## 2017-06-20 MED ORDER — FENTANYL CITRATE (PF) 100 MCG/2ML IJ SOLN
50.0000 ug | Freq: Once | INTRAMUSCULAR | Status: AC
  Administered 2017-06-20: 50 ug via INTRAVENOUS
  Filled 2017-06-20: qty 2

## 2017-06-20 MED ORDER — AMIODARONE HCL IN DEXTROSE 360-4.14 MG/200ML-% IV SOLN
30.0000 mg/h | INTRAVENOUS | Status: DC
  Administered 2017-06-21: 30 mg/h via INTRAVENOUS
  Filled 2017-06-20 (×2): qty 200

## 2017-06-20 MED ORDER — CLOPIDOGREL BISULFATE 75 MG PO TABS
75.0000 mg | ORAL_TABLET | Freq: Every day | ORAL | Status: DC
  Administered 2017-06-21 – 2017-06-22 (×2): 75 mg via ORAL
  Filled 2017-06-20 (×2): qty 1

## 2017-06-20 NOTE — Telephone Encounter (Signed)
Attempted to call patient in regards to cardiac rehab - lm on vm

## 2017-06-20 NOTE — ED Notes (Signed)
Condom cath placed on pt with Hassan Rowan RN to assist. Pt aware of need for urine sample.

## 2017-06-20 NOTE — Progress Notes (Signed)
Pt c/o of having an urge to urinate but unable to do so. Attempted to stand pt at bedside to assist in starting stream, pt unable to maintain gait. Tried sitting pt on side of the bed with urinal; unsuccessful. Bladder scan showed no volume in bladder. Pt's ABD slightly distended and tender to touch. Pt takes medication for enlarged prostate and according to wife has not taken meds today. Will administer these meds and continue to monitor closely.  Jacqlyn Larsen, RN

## 2017-06-20 NOTE — ED Triage Notes (Signed)
Pt here from home via [facility] for neck pain and chest pain related to a fall from dizziness on Sunday.  Pt was seen at [facility] on Sunday and cleared to return home. Pt states unable to get out of bed today d/t pain. Per pt he has had to use a walker since the fall.  Per son, pt needs to use a walker, but does not.

## 2017-06-20 NOTE — ED Notes (Signed)
Attempted report x1. 

## 2017-06-20 NOTE — ED Notes (Signed)
Dr. Einar Gip paged to [facility]

## 2017-06-20 NOTE — ED Provider Notes (Signed)
[facility] 6E PROGRESSIVE CARE Provider Note   CSN: [id number] Arrival date & time: 06/20/17  0945     History   Chief Complaint Chief Complaint  Patient presents with  . Weakness    HPI JALIN ALICEA is a 84 y.o. male.  HPI 84 year old male with extensive past medical history as below including history of A. fib on Xarelto as well as coronary disease on Plavix who presents with generalized weakness after fall.  The patient reportedly fell on 1/13.  He was evaluated at that time at [facility] and had negative imaging and was sent home.  Since then, the patient has been progressively more weak.  He has been unable to get out of bed throughout the last 24 hours.  He is been using a walker, which is not usual for him.  He reports severe headache, neck pain as well as bilateral upper chest pain that is worse with any kind of movement and palpation.  He has not been eating and drinking as much as well.  He has had bilateral knee pain as well.  He lives with his elderly wife and she was unable to get him out of bed today, so EMS was called.  Currently, he reports pain "all over" but denies any specific areas of increased pain.  Denies any recurrent falls.  Denies any loss of consciousness.  He has continued to take both blood thinners.  Past Medical History:  Diagnosis Date  . Abdominal aortic aneurysm (AAA) ([place])   . Arthritis    "shoulders" (04/24/2017)  . Atrial fibrillation with RVR ([place]) 03/2017   [name] 03/08/2017  . COPD (chronic obstructive pulmonary disease) ([place])   . Coronary artery disease   . High cholesterol   . Hypertension   . Pneumonia ~ 2003-2008 X 5   "5 straight years S/P pneumonia shot in ~ 2003"(04/24/2017)  . Skin cancer 2018   "cut it out of right arm"    Patient Active Problem List   Diagnosis Date Noted  . Atrial fibrillation with rapid ventricular response ([place]) 06/20/2017  . Post PTCA 04/24/2017  . CAD (coronary artery disease), native coronary  artery 04/22/2017  . Angina pectoris ([place]) 04/22/2017  . PAF (paroxysmal atrial fibrillation) ([place]) 03/18/2017  . Abnormal nuclear stress test 03/18/2017    Past Surgical History:  Procedure Laterality Date  . APPENDECTOMY    . CARDIAC CATHETERIZATION    . CATARACT EXTRACTION, BILATERAL Bilateral   . CORONARY ANGIOPLASTY WITH STENT PLACEMENT    . CORONARY ATHERECTOMY N/A 04/24/2017   Procedure: CORONARY ATHERECTOMY;  Surgeon: Adrian Prows, MD;  Location: [facility] LAB;  Service: Cardiovascular;  Laterality: N/A;  . CORONARY STENT INTERVENTION N/A 04/24/2017   Procedure: CORONARY STENT INTERVENTION;  Surgeon: Adrian Prows, MD;  Location: [place] [facility] LAB;  Service: Cardiovascular;  Laterality: N/A;  . INCISION AND DRAINAGE  1950s   "RLE; got hurt in service; had to get a bunch of stuff out"  . LEFT HEART CATH AND CORONARY ANGIOGRAPHY N/A 03/19/2017   Procedure: LEFT HEART CATH AND CORONARY ANGIOGRAPHY;  Surgeon: Adrian Prows, MD;  Location: [place] [facility];  Service: Cardiovascular;  Laterality: N/A;  . SKIN CANCER EXCISION Right 2018   "arms"  . TOOTH EXTRACTION         Home Medications    Prior to Admission medications   Medication Sig Start Date End Date Taking? Authorizing Provider  acetaminophen (TYLENOL) 325 MG tablet Take 325 mg daily as  needed by mouth for moderate pain.    Yes [provider]  atorvastatin (LIPITOR) 10 MG tablet Take 10 mg by mouth daily.   Yes [provider]  benazepril-hydrochlorthiazide (LOTENSIN HCT) 20-25 MG tablet Take 1 tablet by mouth daily.   Yes [provider]  cephALEXin (KEFLEX) 500 MG capsule Take 1 capsule (500 mg total) by mouth 3 (three) times daily. 06/17/17  Yes Lajean Saver, MD  clopidogrel (PLAVIX) 75 MG tablet Take 1 tablet (75 mg total) by mouth daily with breakfast. 04/26/17  Yes Adrian Prows, MD  Dutasteride-Tamsulosin HCl (JALYN) 0.5-0.4 MG CAPS Take 1 tablet by mouth daily.   Yes [provider]  metoprolol tartrate (LOPRESSOR) 25 MG tablet Take 2 tablets (50 mg total) by mouth 3 (three) times daily. Patient taking differently: Take 50 mg by mouth 2 (two) times daily.  04/25/17  Yes Adrian Prows, MD  rivaroxaban (XARELTO) 15 MG TABS tablet Take 1 tablet (15 mg total) by mouth daily with supper. Start this evening 03/19/17  Yes Adrian Prows, MD  traMADol (ULTRAM) 50 MG tablet Take 1 tablet (50 mg total) by mouth every 6 (six) hours as needed. 06/17/17  Yes Lajean Saver, MD    Family History Family History  Problem Relation Age of Onset  . Heart attack Father   . Cancer Sister   . Lung cancer Brother     Social History Social History   Tobacco Use  . Smoking status: Current Some Day Smoker    Packs/day: 0.10    Years: 65.00    Pack years: 6.50    Types: Cigarettes  . Smokeless tobacco: Former Systems developer  . Tobacco comment: 04/24/2017 "I smoke ~ 1pack cigarettes/month"  Substance Use Topics  . Alcohol use: No  . Drug use: No     Allergies   Phenergan [promethazine hcl]   Review of Systems Review of Systems  Constitutional: Positive for fatigue. Negative for chills and fever.  HENT: Negative for congestion and rhinorrhea.   Eyes: Negative for visual disturbance.  Respiratory: Negative for cough, shortness of breath and wheezing.   Cardiovascular: Positive for chest pain. Negative for leg swelling.  Gastrointestinal: Negative for abdominal pain, diarrhea, nausea and vomiting.  Genitourinary: Negative for dysuria and flank pain.  Musculoskeletal: Positive for arthralgias and myalgias. Negative for neck pain and neck stiffness.  Skin: Negative for rash and wound.  Allergic/Immunologic: Negative for immunocompromised state.  Neurological: Positive for weakness. Negative for syncope and headaches.  All other systems reviewed and are negative.    Physical Exam Updated Vital Signs BP 115/78   Pulse 94   Temp 99.6 F (37.6 C) (Axillary)   Resp 16   SpO2  93%   Physical Exam  Constitutional: He is oriented to person, place, and time. He appears well-developed and well-nourished. No distress.  HENT:  Head: Normocephalic.  Severe ecchymoses throughout the face, worse over the anterior neck.  Moderate facial tenderness without deformity.  Sutures over the nasal bridge are intact.  No apparent septal hematoma.  Eyes: Conjunctivae are normal.  Neck: Neck supple.  Diffuse midline and paraspinal tenderness throughout the cervical spine.  Cardiovascular: Normal heart sounds. An irregularly irregular rhythm present. Tachycardia present. Exam reveals no friction rub.  No murmur heard. Pulmonary/Chest: Effort normal and breath sounds normal. No respiratory distress. He has no wheezes. He has no rales.  Rales noted in by lateral bases, worse on the left.  Diffuse tenderness to palpation over the anterior chest wall  without significant ecchymoses or deformity.  Abdominal: He exhibits no distension.  Musculoskeletal: He exhibits no edema.  Superficial bruising to bilateral anterior knees.  No obvious deformity or bruising.  Diffuse tenderness over the anterior knee.  Neurological: He is alert and oriented to person, place, and time. He exhibits normal muscle tone.  Skin: Skin is warm. Capillary refill takes less than 2 seconds.  Psychiatric: He has a normal mood and affect.  Nursing note and vitals reviewed.    ED Treatments / Results  Labs (all labs ordered are listed, but only abnormal results are displayed) Labs Reviewed  CBC WITH DIFFERENTIAL/PLATELET - Abnormal; Notable for the following components:      Result Value   WBC 11.2 (*)    Platelets 149 (*)    Neutro Abs 9.4 (*)    All other components within normal limits  COMPREHENSIVE METABOLIC PANEL - Abnormal; Notable for the following components:   Chloride 99 (*)    Glucose, Bld 116 (*)    Calcium 8.7 (*)    Total Protein 6.3 (*)    Albumin 2.8 (*)    ALT 15 (*)    Total Bilirubin  1.9 (*)    GFR calc non Af Amer 59 (*)    All other components within normal limits  BRAIN NATRIURETIC PEPTIDE - Abnormal; Notable for the following components:   B Natriuretic Peptide 806.8 (*)    All other components within normal limits  TROPONIN I - Abnormal; Notable for the following components:   Troponin I 0.03 (*)    All other components within normal limits  MRSA PCR SCREENING  CK  URINALYSIS, ROUTINE W REFLEX MICROSCOPIC  I-STAT TROPONIN, ED    EKG  EKG Interpretation  Date/Time:  Wednesday June 20 2017 09:52:59 EST Ventricular Rate:  124 PR Interval:    QRS Duration: 90 QT Interval:  318 QTC Calculation: 456 R Axis:   32 Text Interpretation:  Atrial fibrillation Low voltage QRS Septal infarct , age undetermined Abnormal ECG Rate has increased since last EKG Confirmed by Duffy Bruce [phone]) on 06/20/2017 9:59:10 AM       Radiology Dg Chest 1 View  Result Date: 06/20/2017 CLINICAL DATA:  Pain following fall EXAM: CHEST 1 VIEW COMPARISON:  March 08, 2017 FINDINGS: There is scarring in the right lower lobe region. There is no edema or consolidation. Heart is upper normal in size with pulmonary vascularity within normal limits. There is aortic atherosclerosis. No adenopathy. No pneumothorax. No bone lesions. IMPRESSION: Scarring right lower lobe. No edema or consolidation. Stable cardiac silhouette. There is aortic atherosclerosis. Aortic Atherosclerosis (ICD10-I70.0). Electronically Signed   By: Lowella Grip III M.D.   On: 06/20/2017 11:32   Ct Head Wo Contrast  Result Date: 06/20/2017 CLINICAL DATA:  Dizziness and pain following recent fall EXAM: CT HEAD WITHOUT CONTRAST CT CERVICAL SPINE WITHOUT CONTRAST TECHNIQUE: Multidetector CT imaging of the head and cervical spine was performed following the standard protocol without intravenous contrast. Multiplanar CT image reconstructions of the cervical spine were also generated. COMPARISON:  CT head and CT cervical  spine June 17, 2017 FINDINGS: CT HEAD FINDINGS Brain: There is persistent diffuse cerebral atrophy. There is no demonstrable mass, hemorrhage, extra-axial fluid collection, or midline shift. Widespread small vessel disease throughout the centra semiovale is stable. Small vessel disease is noted in each external capsule anteriorly. No acute infarct appreciable. Vascular: No hyperdense vessel. There is calcification in each carotid siphon and distal vertebral artery region. Skull: Bony  calvarium appears intact. Fractures of the anterior left and right nasal bones remain stable compared to recent CT. No new fracture evident. Sinuses/Orbits: There is mucosal thickening in several ethmoid air cells bilaterally. There is a retention cyst in the inferior right maxillary antrum. There is a small retention cyst in the anterior left maxillary antrum. Orbits appear symmetric bilaterally. Other: Mastoid air cells are clear. CT CERVICAL SPINE FINDINGS Alignment: There is stable 2 mm retrolisthesis of C3 on C4. No new spondylolisthesis. Skull base and vertebrae: Skull base and craniocervical junction regions appear normal. There is mild pannus posterior to the odontoid which is not causing impression on the craniocervical junction. There is no demonstrable fracture. No blastic or lytic bone lesions. Soft tissues and spinal canal: Prevertebral soft tissues and predental space regions are normal. No paraspinous lesions are evident. There is no cord or canal hematoma. Disc levels: There is moderately severe disc space narrowing at C3-4. There is mild disc space narrowing at C5-6. There is facet hypertrophy at several levels without appreciable nerve root edema or effacement. No disc extrusion or stenosis. Upper chest: There is mild scarring in the lung apices. No upper lung zone edema or consolidation. Other: There is atherosclerotic calcification in both subclavian arteries as well as in both carotid and vertebral arteries.  Calcification appears fairly extensive in the extracranial carotid arteries. IMPRESSION: CT head: Diffuse atrophy with extensive supratentorial small vessel disease, unchanged compared to recent study. No mass, hemorrhage, or extra-axial fluid collection. Foci of arterial vascular calcification noted. Nasal bone fracture stable compared to recent study. Areas of paranasal sinus disease noted. CT cervical spine: No fracture. Mild spondylolisthesis at C3-4 is felt to be due to underlying spondylosis. There is osteoarthritic changes several levels. There is extensive vascular calcification, most notably in the extracranial carotid arteries bilaterally. Electronically Signed   By: Lowella Grip III M.D.   On: 06/20/2017 11:04   Ct Cervical Spine Wo Contrast  Result Date: 06/20/2017 CLINICAL DATA:  Dizziness and pain following recent fall EXAM: CT HEAD WITHOUT CONTRAST CT CERVICAL SPINE WITHOUT CONTRAST TECHNIQUE: Multidetector CT imaging of the head and cervical spine was performed following the standard protocol without intravenous contrast. Multiplanar CT image reconstructions of the cervical spine were also generated. COMPARISON:  CT head and CT cervical spine June 17, 2017 FINDINGS: CT HEAD FINDINGS Brain: There is persistent diffuse cerebral atrophy. There is no demonstrable mass, hemorrhage, extra-axial fluid collection, or midline shift. Widespread small vessel disease throughout the centra semiovale is stable. Small vessel disease is noted in each external capsule anteriorly. No acute infarct appreciable. Vascular: No hyperdense vessel. There is calcification in each carotid siphon and distal vertebral artery region. Skull: Bony calvarium appears intact. Fractures of the anterior left and right nasal bones remain stable compared to recent CT. No new fracture evident. Sinuses/Orbits: There is mucosal thickening in several ethmoid air cells bilaterally. There is a retention cyst in the inferior right  maxillary antrum. There is a small retention cyst in the anterior left maxillary antrum. Orbits appear symmetric bilaterally. Other: Mastoid air cells are clear. CT CERVICAL SPINE FINDINGS Alignment: There is stable 2 mm retrolisthesis of C3 on C4. No new spondylolisthesis. Skull base and vertebrae: Skull base and craniocervical junction regions appear normal. There is mild pannus posterior to the odontoid which is not causing impression on the craniocervical junction. There is no demonstrable fracture. No blastic or lytic bone lesions. Soft tissues and spinal canal: Prevertebral soft tissues and predental space regions  are normal. No paraspinous lesions are evident. There is no cord or canal hematoma. Disc levels: There is moderately severe disc space narrowing at C3-4. There is mild disc space narrowing at C5-6. There is facet hypertrophy at several levels without appreciable nerve root edema or effacement. No disc extrusion or stenosis. Upper chest: There is mild scarring in the lung apices. No upper lung zone edema or consolidation. Other: There is atherosclerotic calcification in both subclavian arteries as well as in both carotid and vertebral arteries. Calcification appears fairly extensive in the extracranial carotid arteries. IMPRESSION: CT head: Diffuse atrophy with extensive supratentorial small vessel disease, unchanged compared to recent study. No mass, hemorrhage, or extra-axial fluid collection. Foci of arterial vascular calcification noted. Nasal bone fracture stable compared to recent study. Areas of paranasal sinus disease noted. CT cervical spine: No fracture. Mild spondylolisthesis at C3-4 is felt to be due to underlying spondylosis. There is osteoarthritic changes several levels. There is extensive vascular calcification, most notably in the extracranial carotid arteries bilaterally. Electronically Signed   By: Lowella Grip III M.D.   On: 06/20/2017 11:04   Dg Knee Complete 4 Views  Left  Result Date: 06/20/2017 CLINICAL DATA:  Fall.  Anterior knee pain, left greater than right. EXAM: LEFT KNEE - COMPLETE 4+ VIEW; RIGHT KNEE - COMPLETE 4+ VIEW COMPARISON:  None. FINDINGS: Mild degenerative changes are most evident in the medial and femoral compartments bilaterally. A small right-sided joint effusion is present. Chondrocalcinosis is present in the menisci bilaterally. Vascular calcifications are present. The joints are located bilaterally. No acute osseous abnormalities are present. IMPRESSION: 1. Bilateral chondrocalcinosis compatible with CPPD arthropathy. 2. Mild osseous degenerative changes in the medial and patellofemoral compartments bilaterally. 3. No acute osseous abnormality. 4. Extensive vascular calcifications.  Atherosclerosis. Electronically Signed   By: San Morelle M.D.   On: 06/20/2017 11:34   Dg Knee Complete 4 Views Right  Result Date: 06/20/2017 CLINICAL DATA:  Fall.  Anterior knee pain, left greater than right. EXAM: LEFT KNEE - COMPLETE 4+ VIEW; RIGHT KNEE - COMPLETE 4+ VIEW COMPARISON:  None. FINDINGS: Mild degenerative changes are most evident in the medial and femoral compartments bilaterally. A small right-sided joint effusion is present. Chondrocalcinosis is present in the menisci bilaterally. Vascular calcifications are present. The joints are located bilaterally. No acute osseous abnormalities are present. IMPRESSION: 1. Bilateral chondrocalcinosis compatible with CPPD arthropathy. 2. Mild osseous degenerative changes in the medial and patellofemoral compartments bilaterally. 3. No acute osseous abnormality. 4. Extensive vascular calcifications.  Atherosclerosis. Electronically Signed   By: San Morelle M.D.   On: 06/20/2017 11:34    Procedures .Critical Care Performed by: Duffy Bruce, MD Authorized by: Duffy Bruce, MD   Critical care provider statement:    Critical care time (minutes):  35   Critical care time was exclusive of:   Separately billable procedures and treating other patients and teaching time   Critical care was necessary to treat or prevent imminent or life-threatening deterioration of the following conditions:  Cardiac failure, circulatory failure and trauma   Critical care was time spent personally by me on the following activities:  Development of treatment plan with patient or surrogate, discussions with consultants, evaluation of patient's response to treatment, examination of patient, obtaining history from patient or surrogate, ordering and performing treatments and interventions, ordering and review of laboratory studies, ordering and review of radiographic studies, pulse oximetry, re-evaluation of patient's condition and review of old charts   I assumed direction of critical  care for this patient from another provider in my specialty: no      (including critical care time)  Medications Ordered in ED Medications  diltiazem (CARDIZEM) 1 mg/mL load via infusion 10 mg (10 mg Intravenous Bolus from Bag 06/20/17 1217)    And  diltiazem (CARDIZEM) 100 mg in dextrose 5% 152mL (1 mg/mL) infusion (15 mg/hr Intravenous Transfusing/Transfer 06/20/17 1714)  amiodarone (NEXTERONE) 1.8 mg/mL load via infusion 150 mg (150 mg Intravenous Bolus from Bag 06/20/17 1507)    Followed by  amiodarone (NEXTERONE PREMIX) 360-4.14 MG/200ML-% (1.8 mg/mL) IV infusion (60 mg/hr Intravenous Transfusing/Transfer 06/20/17 1714)    Followed by  amiodarone (NEXTERONE PREMIX) 360-4.14 MG/200ML-% (1.8 mg/mL) IV infusion (not administered)  0.9 %  sodium chloride infusion (not administered)  fentaNYL (SUBLIMAZE) injection 50 mcg (50 mcg Intravenous Given 06/20/17 1217)  furosemide (LASIX) injection 40 mg (40 mg Intravenous Given 06/20/17 1453)  morphine 4 MG/ML injection 4 mg (4 mg Intravenous Given 06/20/17 1453)     Initial Impression / Assessment and Plan / ED Course  I have reviewed the triage vital signs and the nursing  notes.  Pertinent labs & imaging results that were available during my care of the patient were reviewed by me and considered in my medical decision making (see chart for details).     84 year old male with past medical history as above here with generalized weakness after fall several days ago.  No new falls.  On exam, he does have multiple areas of tenderness but imaging shows no acute fractures.  Of note, however, the patient is noted to be in A. fib RVR and endorses severe weakness and difficulty ambulating.  This has not improved with pain control.  I suspect his symptoms are secondary to recent fall with pain causing A. fib RVR and decompensation.  BNP elevated and I suspect he has a component of dCHF exacerbation. Patient requiring a diltiazem bolus and drip.  Will admit to medicine for further management. EKG shows AFib but no acute ischemic changes, doubt ACS.  Dw Dr. Einar Gip who is in agreement. Amio bolus and drip started for persistent RVR.   Final Clinical Impressions(s) / ED Diagnoses   Final diagnoses:  Generalized weakness  Atrial fibrillation with rapid ventricular response [facility])    ED Discharge Orders    None       Duffy Bruce, MD 06/20/17 1751

## 2017-06-20 NOTE — H&P (Signed)
Jeremy Macdonald is an 84 y.o. male.   Chief Complaint: Generalized weakness HPI:   84 year old Caucasian male with coronary artery disease status post complex LAD PCI 04/24/17, paroxysmal atrial fibrillation, abdominal aortic aneurysm, bilateral mild carotid artery stenosis, hypertension, hyperlipidemia, tobacco abuse. Patient has been feeling lightheaded for the last few days. He had a fall on Sunday 06/17/17 after a mechanical fall abetted by his lightheadedness. He was taken to [facility] that day. Imaging showed nasal bone fracture, but no other skeletal injuries. Since then, he has had generalized weakness and unable to stand up. He denies any chest pain, shortness of breath, palpitations. In the ED, he was found to be in Afib RVR. While BNP is elevated, chest xray shows no signs of vascular congestion. Blood work shows mild leukocytosis. Wife and son by bedside voice concerns about inability to take care of him at home.  Past Medical History:  Diagnosis Date  . Abdominal aortic aneurysm (AAA) ([place])   . Arthritis    "shoulders" (04/24/2017)  . Atrial fibrillation with RVR ([place]) 03/2017   [name] 03/08/2017  . COPD (chronic obstructive pulmonary disease) ([place])   . Coronary artery disease   . High cholesterol   . Hypertension   . Pneumonia ~ 2003-2008 X 5   "5 straight years S/P pneumonia shot in ~ 2003"(04/24/2017)  . Skin cancer 2018   "cut it out of right arm"    Past Surgical History:  Procedure Laterality Date  . APPENDECTOMY    . CARDIAC CATHETERIZATION    . CATARACT EXTRACTION, BILATERAL Bilateral   . CORONARY ANGIOPLASTY WITH STENT PLACEMENT    . CORONARY ATHERECTOMY N/A 04/24/2017   Procedure: CORONARY ATHERECTOMY;  Surgeon: Adrian Prows, MD;  Location: [place] [facility];  Service: Cardiovascular;  Laterality: N/A;  . CORONARY STENT INTERVENTION N/A 04/24/2017   Procedure: CORONARY STENT INTERVENTION;  Surgeon: Adrian Prows, MD;  Location: [place] [facility];   Service: Cardiovascular;  Laterality: N/A;  . INCISION AND DRAINAGE  1950s   "RLE; got hurt in service; had to get a bunch of stuff out"  . LEFT HEART CATH AND CORONARY ANGIOGRAPHY N/A 03/19/2017   Procedure: LEFT HEART CATH AND CORONARY ANGIOGRAPHY;  Surgeon: Adrian Prows, MD;  Location: [place] [facility];  Service: Cardiovascular;  Laterality: N/A;  . SKIN CANCER EXCISION Right 2018   "arms"  . TOOTH EXTRACTION      Family History  Problem Relation Age of Onset  . Heart attack Father   . Cancer Sister   . Lung cancer Brother    Social History:  reports that he has been smoking cigarettes.  He has a 6.50 pack-year smoking history. He has quit using smokeless tobacco. He reports that he does not drink alcohol or use drugs.  Allergies:  Allergies  Allergen Reactions  . Phenergan [Promethazine Hcl] Other (See Comments)    Confusion      (Not in a hospital admission)  Results for orders placed or performed during the hospital encounter of 06/20/17 (from the past 48 hour(s))  CBC with Differential     Status: Abnormal   Collection Time: 06/20/17 10:33 AM  Result Value Ref Range   WBC 11.2 (H) 4.0 - 10.5 K/uL   RBC 4.40 4.22 - 5.81 MIL/uL   Hemoglobin 13.6 13.0 - 17.0 g/dL   HCT 42.8 39.0 - 52.0 %   MCV 97.3 78.0 - 100.0 fL   MCH 30.9 26.0 - 34.0 pg   MCHC  31.8 30.0 - 36.0 g/dL   RDW 14.7 11.5 - 15.5 %   Platelets 149 (L) 150 - 400 K/uL   Neutrophils Relative % 85 %   Neutro Abs 9.4 (H) 1.7 - 7.7 K/uL   Lymphocytes Relative 7 %   Lymphs Abs 0.8 0.7 - 4.0 K/uL   Monocytes Relative 8 %   Monocytes Absolute 0.9 0.1 - 1.0 K/uL   Eosinophils Relative 0 %   Eosinophils Absolute 0.0 0.0 - 0.7 K/uL   Basophils Relative 0 %   Basophils Absolute 0.0 0.0 - 0.1 K/uL  Comprehensive metabolic panel     Status: Abnormal   Collection Time: 06/20/17 10:33 AM  Result Value Ref Range   Sodium 135 135 - 145 mmol/L   Potassium 4.0 3.5 - 5.1 mmol/L   Chloride 99 (L) 101 - 111 mmol/L    CO2 23 22 - 32 mmol/L   Glucose, Bld 116 (H) 65 - 99 mg/dL   BUN 13 6 - 20 mg/dL   Creatinine, Ser 1.07 0.61 - 1.24 mg/dL   Calcium 8.7 (L) 8.9 - 10.3 mg/dL   Total Protein 6.3 (L) 6.5 - 8.1 g/dL   Albumin 2.8 (L) 3.5 - 5.0 g/dL   AST 16 15 - 41 U/L   ALT 15 (L) 17 - 63 U/L   Alkaline Phosphatase 68 38 - 126 U/L   Total Bilirubin 1.9 (H) 0.3 - 1.2 mg/dL   GFR calc non Af Amer 59 (L) >60 mL/min   GFR calc Af Amer >60 >60 mL/min    Comment: (NOTE) The eGFR has been calculated using the CKD EPI equation. This calculation has not been validated in all clinical situations. eGFR's persistently <60 mL/min signify possible Chronic Kidney Disease.    Anion gap 13 5 - 15  Brain natriuretic peptide     Status: Abnormal   Collection Time: 06/20/17 10:33 AM  Result Value Ref Range   B Natriuretic Peptide 806.8 (H) 0.0 - 100.0 pg/mL  CK     Status: None   Collection Time: 06/20/17 10:33 AM  Result Value Ref Range   Total CK 90 49 - 397 U/L  I-Stat Troponin, ED (not at [facility])     Status: None   Collection Time: 06/20/17 10:43 AM  Result Value Ref Range   Troponin i, poc 0.02 0.00 - 0.08 ng/mL   Comment 3            Comment: Due to the release kinetics of cTnI, a negative result within the first hours of the onset of symptoms does not rule out myocardial infarction with certainty. If myocardial infarction is still suspected, repeat the test at appropriate intervals.   Troponin I     Status: Abnormal   Collection Time: 06/20/17 12:33 PM  Result Value Ref Range   Troponin I 0.03 (HH) <0.03 ng/mL    Comment: CRITICAL RESULT CALLED TO, READ BACK BY AND VERIFIED WITH: FELTS,C RN [id number] 06/20/2017 BY A BENNETT    Dg Chest 1 View  Result Date: 06/20/2017 CLINICAL DATA:  Pain following fall EXAM: CHEST 1 VIEW COMPARISON:  March 08, 2017 FINDINGS: There is scarring in the right lower lobe region. There is no edema or consolidation. Heart is upper normal in size with pulmonary vascularity within  normal limits. There is aortic atherosclerosis. No adenopathy. No pneumothorax. No bone lesions. IMPRESSION: Scarring right lower lobe. No edema or consolidation. Stable cardiac silhouette. There is aortic atherosclerosis. Aortic Atherosclerosis (ICD10-I70.0). Electronically Signed  By: Lowella Grip III M.D.   On: 06/20/2017 11:32   Ct Head Wo Contrast  Result Date: 06/20/2017 CLINICAL DATA:  Dizziness and pain following recent fall EXAM: CT HEAD WITHOUT CONTRAST CT CERVICAL SPINE WITHOUT CONTRAST TECHNIQUE: Multidetector CT imaging of the head and cervical spine was performed following the standard protocol without intravenous contrast. Multiplanar CT image reconstructions of the cervical spine were also generated. COMPARISON:  CT head and CT cervical spine June 17, 2017 FINDINGS: CT HEAD FINDINGS Brain: There is persistent diffuse cerebral atrophy. There is no demonstrable mass, hemorrhage, extra-axial fluid collection, or midline shift. Widespread small vessel disease throughout the centra semiovale is stable. Small vessel disease is noted in each external capsule anteriorly. No acute infarct appreciable. Vascular: No hyperdense vessel. There is calcification in each carotid siphon and distal vertebral artery region. Skull: Bony calvarium appears intact. Fractures of the anterior left and right nasal bones remain stable compared to recent CT. No new fracture evident. Sinuses/Orbits: There is mucosal thickening in several ethmoid air cells bilaterally. There is a retention cyst in the inferior right maxillary antrum. There is a small retention cyst in the anterior left maxillary antrum. Orbits appear symmetric bilaterally. Other: Mastoid air cells are clear. CT CERVICAL SPINE FINDINGS Alignment: There is stable 2 mm retrolisthesis of C3 on C4. No new spondylolisthesis. Skull base and vertebrae: Skull base and craniocervical junction regions appear normal. There is mild pannus posterior to the odontoid  which is not causing impression on the craniocervical junction. There is no demonstrable fracture. No blastic or lytic bone lesions. Soft tissues and spinal canal: Prevertebral soft tissues and predental space regions are normal. No paraspinous lesions are evident. There is no cord or canal hematoma. Disc levels: There is moderately severe disc space narrowing at C3-4. There is mild disc space narrowing at C5-6. There is facet hypertrophy at several levels without appreciable nerve root edema or effacement. No disc extrusion or stenosis. Upper chest: There is mild scarring in the lung apices. No upper lung zone edema or consolidation. Other: There is atherosclerotic calcification in both subclavian arteries as well as in both carotid and vertebral arteries. Calcification appears fairly extensive in the extracranial carotid arteries. IMPRESSION: CT head: Diffuse atrophy with extensive supratentorial small vessel disease, unchanged compared to recent study. No mass, hemorrhage, or extra-axial fluid collection. Foci of arterial vascular calcification noted. Nasal bone fracture stable compared to recent study. Areas of paranasal sinus disease noted. CT cervical spine: No fracture. Mild spondylolisthesis at C3-4 is felt to be due to underlying spondylosis. There is osteoarthritic changes several levels. There is extensive vascular calcification, most notably in the extracranial carotid arteries bilaterally. Electronically Signed   By: Lowella Grip III M.D.   On: 06/20/2017 11:04   Ct Cervical Spine Wo Contrast  Result Date: 06/20/2017 CLINICAL DATA:  Dizziness and pain following recent fall EXAM: CT HEAD WITHOUT CONTRAST CT CERVICAL SPINE WITHOUT CONTRAST TECHNIQUE: Multidetector CT imaging of the head and cervical spine was performed following the standard protocol without intravenous contrast. Multiplanar CT image reconstructions of the cervical spine were also generated. COMPARISON:  CT head and CT cervical  spine June 17, 2017 FINDINGS: CT HEAD FINDINGS Brain: There is persistent diffuse cerebral atrophy. There is no demonstrable mass, hemorrhage, extra-axial fluid collection, or midline shift. Widespread small vessel disease throughout the centra semiovale is stable. Small vessel disease is noted in each external capsule anteriorly. No acute infarct appreciable. Vascular: No hyperdense vessel. There is calcification in  each carotid siphon and distal vertebral artery region. Skull: Bony calvarium appears intact. Fractures of the anterior left and right nasal bones remain stable compared to recent CT. No new fracture evident. Sinuses/Orbits: There is mucosal thickening in several ethmoid air cells bilaterally. There is a retention cyst in the inferior right maxillary antrum. There is a small retention cyst in the anterior left maxillary antrum. Orbits appear symmetric bilaterally. Other: Mastoid air cells are clear. CT CERVICAL SPINE FINDINGS Alignment: There is stable 2 mm retrolisthesis of C3 on C4. No new spondylolisthesis. Skull base and vertebrae: Skull base and craniocervical junction regions appear normal. There is mild pannus posterior to the odontoid which is not causing impression on the craniocervical junction. There is no demonstrable fracture. No blastic or lytic bone lesions. Soft tissues and spinal canal: Prevertebral soft tissues and predental space regions are normal. No paraspinous lesions are evident. There is no cord or canal hematoma. Disc levels: There is moderately severe disc space narrowing at C3-4. There is mild disc space narrowing at C5-6. There is facet hypertrophy at several levels without appreciable nerve root edema or effacement. No disc extrusion or stenosis. Upper chest: There is mild scarring in the lung apices. No upper lung zone edema or consolidation. Other: There is atherosclerotic calcification in both subclavian arteries as well as in both carotid and vertebral arteries.  Calcification appears fairly extensive in the extracranial carotid arteries. IMPRESSION: CT head: Diffuse atrophy with extensive supratentorial small vessel disease, unchanged compared to recent study. No mass, hemorrhage, or extra-axial fluid collection. Foci of arterial vascular calcification noted. Nasal bone fracture stable compared to recent study. Areas of paranasal sinus disease noted. CT cervical spine: No fracture. Mild spondylolisthesis at C3-4 is felt to be due to underlying spondylosis. There is osteoarthritic changes several levels. There is extensive vascular calcification, most notably in the extracranial carotid arteries bilaterally. Electronically Signed   By: Lowella Grip III M.D.   On: 06/20/2017 11:04   Dg Knee Complete 4 Views Left  Result Date: 06/20/2017 CLINICAL DATA:  Fall.  Anterior knee pain, left greater than right. EXAM: LEFT KNEE - COMPLETE 4+ VIEW; RIGHT KNEE - COMPLETE 4+ VIEW COMPARISON:  None. FINDINGS: Mild degenerative changes are most evident in the medial and femoral compartments bilaterally. A small right-sided joint effusion is present. Chondrocalcinosis is present in the menisci bilaterally. Vascular calcifications are present. The joints are located bilaterally. No acute osseous abnormalities are present. IMPRESSION: 1. Bilateral chondrocalcinosis compatible with CPPD arthropathy. 2. Mild osseous degenerative changes in the medial and patellofemoral compartments bilaterally. 3. No acute osseous abnormality. 4. Extensive vascular calcifications.  Atherosclerosis. Electronically Signed   By: San Morelle M.D.   On: 06/20/2017 11:34   Dg Knee Complete 4 Views Right  Result Date: 06/20/2017 CLINICAL DATA:  Fall.  Anterior knee pain, left greater than right. EXAM: LEFT KNEE - COMPLETE 4+ VIEW; RIGHT KNEE - COMPLETE 4+ VIEW COMPARISON:  None. FINDINGS: Mild degenerative changes are most evident in the medial and femoral compartments bilaterally. A small  right-sided joint effusion is present. Chondrocalcinosis is present in the menisci bilaterally. Vascular calcifications are present. The joints are located bilaterally. No acute osseous abnormalities are present. IMPRESSION: 1. Bilateral chondrocalcinosis compatible with CPPD arthropathy. 2. Mild osseous degenerative changes in the medial and patellofemoral compartments bilaterally. 3. No acute osseous abnormality. 4. Extensive vascular calcifications.  Atherosclerosis. Electronically Signed   By: San Morelle M.D.   On: 06/20/2017 11:34    Abdominal aortic duplex  08/23/2016: Moderate dilatation of the abdominal aorta is noted in the mid and distal aorta measuring 4.11  x 4.18  x 4.44  cm is seen. Diffuse plaque noted in the proximal aorta. Focal plaque noted in the mid and distal aorta. Compared to the study done on 02/15/2016, no significant change.   Review of Systems  Constitutional: Positive for malaise/fatigue. Negative for chills and fever.  HENT: Positive for hearing loss. Negative for nosebleeds.   Eyes: Negative for blurred vision.  Respiratory: Negative for cough, sputum production and shortness of breath.   Cardiovascular: Negative for chest pain, palpitations, orthopnea, leg swelling and PND.  Gastrointestinal: Negative for abdominal pain, heartburn and nausea.  Genitourinary:       Unable to urinate. Bladder scan shows no significant urine  Musculoskeletal: Positive for falls and neck pain.  Skin:       Facial bruise.  Neurological: Positive for dizziness. Negative for loss of consciousness.  Endo/Heme/Allergies: Bruises/bleeds easily.  Psychiatric/Behavioral: Positive for memory loss.  All other systems reviewed and are negative.   Blood pressure 125/74, pulse 99, temperature 98.2 F (36.8 C), temperature source Oral, resp. rate 19, SpO2 91 %. Physical Exam  Vitals reviewed. Constitutional: He is oriented to person, place, and time. He appears well-developed and  well-nourished.  HENT:  Nasal closed fracture. Large facial bruise  Neck: Normal range of motion. Neck supple. No JVD present.  Cardiovascular:  No murmur heard. Irregularly irregular. Tachycardic.  Intact distal pulses  Respiratory: Effort normal and breath sounds normal.  GI: Bowel sounds are normal. He exhibits distension. There is no tenderness.  Musculoskeletal: He exhibits tenderness. He exhibits no edema.  Reduced neck movement due to pain  Lymphadenopathy:    He has no cervical adenopathy.  Neurological: He is alert and oriented to person, place, and time.  Skin: Skin is warm and dry.  Psychiatric: He has a normal mood and affect.     Assessment: 84 year old Caucasian male with coronary artery disease status post complex LAD PCI 04/24/17, paroxysmal atrial fibrillation, abdominal aortic aneurysm, bilateral mild carotid artery stenosis, hypertension, hyperlipidemia, tobacco abuse. Now admitted with generalized weakness.  Generalized weakness: Multifactorial. I do think he is dehydrated, even though his BNP is elevated.  Mild leukocytosis Afib w/RVR: CHA2DS2VAsc score 4. Stroke risk 4.8% CAD s/p LAD PCI 04/2017 Abdominal aortic aneuurysm Bilareral mild carotid artery stenosis Hypertension Hyperlipidemia Tobacco abuse  Plan: Admit to telemetry IV amiodarone and diltiazem for rate control. Continue Xarelto for anticoagulation IV hydration 100 cc/hr for 12 hrs Urinalysis. Renal ultrasound Continue plavix, metoprolol Tramadol for pain control    Nigel Mormon, MD 06/20/2017, 4:21 PM  Rider Ermis Esther Hardy, MD [facility] Pager: [phone] Office: [phone] If no answer Cell [phone]

## 2017-06-21 ENCOUNTER — Encounter (HOSPITAL_COMMUNITY): Payer: Self-pay | Admitting: General Practice

## 2017-06-21 ENCOUNTER — Other Ambulatory Visit: Payer: Self-pay

## 2017-06-21 ENCOUNTER — Inpatient Hospital Stay (HOSPITAL_COMMUNITY): Payer: Medicare Other

## 2017-06-21 DIAGNOSIS — R42 Dizziness and giddiness: Secondary | ICD-10-CM

## 2017-06-21 DIAGNOSIS — I4891 Unspecified atrial fibrillation: Secondary | ICD-10-CM

## 2017-06-21 DIAGNOSIS — R739 Hyperglycemia, unspecified: Secondary | ICD-10-CM | POA: Diagnosis present

## 2017-06-21 DIAGNOSIS — N400 Enlarged prostate without lower urinary tract symptoms: Secondary | ICD-10-CM | POA: Diagnosis present

## 2017-06-21 DIAGNOSIS — F0390 Unspecified dementia without behavioral disturbance: Secondary | ICD-10-CM | POA: Diagnosis present

## 2017-06-21 LAB — BASIC METABOLIC PANEL
Anion gap: 13 (ref 5–15)
BUN: 18 mg/dL (ref 6–20)
CALCIUM: 7.9 mg/dL — AB (ref 8.9–10.3)
CO2: 24 mmol/L (ref 22–32)
CREATININE: 1.57 mg/dL — AB (ref 0.61–1.24)
Chloride: 98 mmol/L — ABNORMAL LOW (ref 101–111)
GFR calc Af Amer: 43 mL/min — ABNORMAL LOW (ref >60)
GFR, EST NON AFRICAN AMERICAN: 37 mL/min — AB (ref >60)
GLUCOSE: 163 mg/dL — AB (ref 65–99)
POTASSIUM: 3.7 mmol/L (ref 3.5–5.1)
SODIUM: 135 mmol/L (ref 135–145)

## 2017-06-21 LAB — CBC
HCT: 37.5 % — ABNORMAL LOW (ref 39.0–52.0)
Hemoglobin: 12.1 g/dL — ABNORMAL LOW (ref 13.0–17.0)
MCH: 31 pg (ref 26.0–34.0)
MCHC: 32.3 g/dL (ref 30.0–36.0)
MCV: 96.2 fL (ref 78.0–100.0)
PLATELETS: 139 10*3/uL — AB (ref 150–400)
RBC: 3.9 MIL/uL — ABNORMAL LOW (ref 4.22–5.81)
RDW: 14.6 % (ref 11.5–15.5)
WBC: 10.2 10*3/uL (ref 4.0–10.5)

## 2017-06-21 MED ORDER — DOCUSATE SODIUM 100 MG PO CAPS: 100.0000 mg | ORAL_CAPSULE | Freq: Two times a day (BID) | ORAL | Status: DC | PRN

## 2017-06-21 MED ORDER — POLYETHYLENE GLYCOL 3350 17 G PO PACK
17.0000 g | PACK | Freq: Every day | ORAL | Status: DC
  Administered 2017-06-21 – 2017-06-24 (×3): 17 g via ORAL
  Filled 2017-06-21 (×4): qty 1

## 2017-06-21 MED ORDER — LIDOCAINE HCL 2 % EX GEL
1.0000 "application " | Freq: Once | CUTANEOUS | Status: AC
  Administered 2017-06-21: 1 via URETHRAL
  Filled 2017-06-21: qty 20

## 2017-06-21 NOTE — Plan of Care (Signed)
Pt now with rate controlled A. Fib. IV cardizem and amiodarone infusing. BP WNL. Continuing to monitor closely.

## 2017-06-21 NOTE — Progress Notes (Signed)
Subjective:  Feels better  Had urinary retention 800 cc last night, Coude catheter had to be placed Blood in bag this morning  Breathing stable No chest pain   Objective:  Vital Signs in the last 24 hours: Temp:  [98.6 F (37 C)-99.6 F (37.6 C)] 98.6 F (37 C) (01/17 0629) Pulse Rate:  [40-129] 63 (01/17 0920) Resp:  [10-23] 13 (01/17 0920) BP: (93-150)/(51-109) 104/73 (01/17 0920) SpO2:  [88 %-100 %] 99 % (01/17 0920) Weight:  [70.1 kg (154 lb 8 oz)-75.8 kg (167 lb 1.7 oz)] 75.8 kg (167 lb 1.7 oz) (01/17 0629)  Intake/Output from previous day: 01/16 0701 - 01/17 0700 In: 1641.5 [P.O.:240; I.V.:1401.5] Out: 1000 [Urine:1000] Intake/Output from this shift: No intake/output data recorded.  Physical Exam: Vitals reviewed. Constitutional: He is oriented to person, place, and time. He appears well-developed and well-nourished.  HENT:  Nasal closed fracture. Large facial bruise  Neck: Normal range of motion. Neck supple. No JVD present.  Cardiovascular:  No murmur heard. Irregularly irregular. Tachycardic.  Intact distal pulses  Respiratory: Effort normal and breath sounds normal.  GI: Bowel sounds are normal. He exhibits distension. There is no tenderness.  Musculoskeletal: He exhibits tenderness. He exhibits no edema.  Reduced neck movement due to pain  Lymphadenopathy:    He has no cervical adenopathy.  Neurological: He is alert and oriented to person, place, and time.  Skin: Skin is warm and dry.  Psychiatric: He has a normal mood and affect.     Lab Results: Recent Labs    06/20/17 1033 06/21/17 0901  WBC 11.2* 10.2  HGB 13.6 12.1*  PLT 149* 139*   Recent Labs    06/20/17 1033 06/21/17 0901  NA 135 135  K 4.0 3.7  CL 99* 98*  CO2 23 24  GLUCOSE 116* 163*  BUN 13 18  CREATININE 1.07 1.57*   Recent Labs    06/20/17 1233  TROPONINI 0.03*   Hepatic Function Panel Recent Labs    06/20/17 1033  PROT 6.3*  ALBUMIN 2.8*  AST 16  ALT 15*   ALKPHOS 68  BILITOT 1.9*   Imaging: EXAM: RENAL / URINARY TRACT ULTRASOUND COMPLETE  COMPARISON:  None.  FINDINGS: Right Kidney:  Length: 9.8 cm. Echogenicity is within normal limits. No mass or hydronephrosis visualized.  Left Kidney:  Length: 10.9 cm. Echogenicity within normal limits. No hydronephrosis visualized. 2.5 cm simple cyst is seen in lower pole.  Bladder:  Decompressed secondary to Foley catheter. Severely enlarged prostate gland is again noted, measuring 6.8 x 6.2 x 4.9 cm.  IMPRESSION: No hydronephrosis or renal obstruction is noted. Severely enlarged prostate gland is again noted as described on prior CT scan.   Assessment: 84 year old Caucasian male with coronary artery disease status post complex LAD PCI 04/24/17, paroxysmal atrial fibrillation, abdominal aortic aneurysm, bilateral mild carotid artery stenosis, hypertension, hyperlipidemia, tobacco abuse. Now admitted with generalized weakness.  Urinary retention, prostate enlargement. Coude catheter in place Acute kidney injury: Pre-renal and obstruction Generalized weakness: Multifactorial. I do think he is dehydrated, even though his BNP is elevated.  Mild leukocytosis Afib w/RVR: CHA2DS2VAsc score 4. Stroke risk 4.8% CAD s/p LAD PCI 04/2017 Abdominal aortic aneurysm Bilareral mild carotid artery stenosis Hypertension Hyperlipidemia Tobacco abuse  Plan: Admitted to telemetry Weaned off diltiazem and amiodarone. Continue metoprolol Continue metoprolol Urology consulted for urinary retention Mildly elevated trponin likely type 2. Not MI. No ongoing cardiac issues. Primary concern is placement. Case management working. Will request hospitalist service to  take over care,  IV hydration 50 cc/hr for 12 hrs    LOS: 1 day    Seab Axel J Bettyanne Dittman 06/21/2017, 11:27 AM  Tereka Thorley Esther Hardy, MD [facility] [place] Pager: [phone] Office: [phone] If no answer Cell  [phone]

## 2017-06-21 NOTE — Progress Notes (Signed)
Pt HR dropping below 60, BP 108/74. Cardizem gtt titrated off.

## 2017-06-21 NOTE — Consult Note (Signed)
Medical Consultation   Jeremy Macdonald  MBW:[id number]  DOB: 01/29/1926  DOA: 06/20/2017  PCP: Vernie Shanks, MD   Outpatient Specialists: Gaynelle Arabian - urology; Einar Gip - cardiology  Requesting physician: [place]  Reason for consultation: Medical consultation  History of Present Illness: Jeremy Macdonald is an 84 y.o. male with h/o HTN; HLD; CAD; COPD; and afib.  He was admitted by the cardiology service yesterday for afib with RVR after suffering a fall on 1/13 with nasal fracture and multiple ecchymoses.  TRH was consulted regarding dizziness.    He has had trouble walking.  This time, he was outside and he tripped over a curb and hit his head.  He is confused and so not a great historian but reports that he felt dizzy.  He has been a bit more dizzy in the last 6 months.  His wife notes that this dizziness has been ongoing since he had the stent placed but she doesn't think that is related to his dizziness.  5ya he developed urinary retention; Infrequent incontinence.  They feel like the Lowella Dandy has worked very well for his BPH.  He has had some dizziness while in the hospital.  Dizziness is only when he is up. The dizziness has improved with rate control.  He did have urinary retention last night requiring a foley.  The urinary retention resulted in obstructive uropathy and acute renal failure; he is now again making urine albeit blood-tinged.  Urology has also been asked to consult on the patient.       Review of Systems:  ROS As per HPI otherwise 10 point review of systems negative.    Past Medical History: Past Medical History:  Diagnosis Date  . Abdominal aortic aneurysm (AAA) ([place])   . Arthritis    "shoulders" (04/24/2017)  . Atrial fibrillation with RVR ([place]) 03/2017   [name] 03/08/2017  . COPD (chronic obstructive pulmonary disease) ([place])   . Coronary artery disease   . Dementia   . Fall at home 06/17/2017   nasal fracture  . High cholesterol     . HOH (hard of hearing)   . Hypertension   . Pneumonia ~ 2003-2008 X 5   "5 straight years S/P pneumonia shot in ~ 2003"(04/24/2017)  . Skin cancer 2018   "cut it out of right arm"    Past Surgical History: Past Surgical History:  Procedure Laterality Date  . APPENDECTOMY    . CARDIAC CATHETERIZATION    . CATARACT EXTRACTION, BILATERAL Bilateral   . CORONARY ANGIOPLASTY WITH STENT PLACEMENT    . CORONARY ATHERECTOMY N/A 04/24/2017   Procedure: CORONARY ATHERECTOMY;  Surgeon: Adrian Prows, MD;  Location: [place] [facility];  Service: Cardiovascular;  Laterality: N/A;  . CORONARY STENT INTERVENTION N/A 04/24/2017   Procedure: CORONARY STENT INTERVENTION;  Surgeon: Adrian Prows, MD;  Location: [place] [facility];  Service: Cardiovascular;  Laterality: N/A;  . INCISION AND DRAINAGE  1950s   "RLE; got hurt in service; had to get a bunch of stuff out"  . LEFT HEART CATH AND CORONARY ANGIOGRAPHY N/A 03/19/2017   Procedure: LEFT HEART CATH AND CORONARY ANGIOGRAPHY;  Surgeon: Adrian Prows, MD;  Location: [place] [facility];  Service: Cardiovascular;  Laterality: N/A;  . SKIN CANCER EXCISION Right 2018   "arms"  . TOOTH EXTRACTION       Allergies:   Allergies  Allergen Reactions  . Phenergan [Promethazine  Hcl] Other (See Comments)    Confusion      Social History:  reports that he has been smoking cigarettes.  He has a 6.50 pack-year smoking history. He has quit using smokeless tobacco. He reports that he does not drink alcohol or use drugs.   Family History: Family History  Problem Relation Age of Onset  . Heart attack Father   . Cancer Sister   . Lung cancer Brother       Physical Exam: Vitals:   06/21/17 0853 06/21/17 0920 06/21/17 1237 06/21/17 1319  BP: 97/76 104/73 (!) 141/77 103/69  Pulse: 82 63 77 88  Resp: 19 13 20 18   Temp:   98.4 F (36.9 C) 97.8 F (36.6 C)  TempSrc:   Oral Oral  SpO2: 100% 99% 91% 97%  Weight:      Height:        Constitutional: Alert  and awake, oriented x3, not in any acute distress. Eyes: PERLA, EOMI, irises appear normal, anicteric sclera,  ENMT: Multiple facial and neck excoriations and ecchymoses with nasal bridge laceration s/p nasal fracture Neck: neck with diffuse ecchymoses, no masses, normal ROM, no thyromegaly, no JVD  CVS:  Irregularly irregular but rate controlled on Diltiazem drip,  no LE edema, normal pedal pulses  Respiratory:  clear to auscultation bilaterally, no wheezing, rales or rhonchi. Respiratory effort normal. No accessory muscle use.  Abdomen: soft nontender, nondistended, normal bowel sounds, no hepatosplenomegaly, no hernias  Musculoskeletal: : no cyanosis, clubbing or edema noted bilaterally Neuro: Cranial nerves II-XII intact, strength, sensation, reflexes Psych: judgement and insight appear normal, stable mood and affect, mental status appears normal but on further questioning the patient is oriented to person and time but not at all to place and he exhibits masking behaviors concerning for dementia Skin:  Diffuse primary head/neck ecchymoses and excoriations Urine in foley bag is bloody  Data reviewed:  I have personally reviewed following labs and imaging studies Labs:   Pertinent labs:   Glucose 163 BUN 18/Creatinine 1.57/GFR 37 Hgb 12.1 UA: small Hgb, 20 ketones, rare bacteria - prior to foley placement   Inpatient Medications:   Scheduled Meds: . atorvastatin  10 mg Oral Q2000  . clopidogrel  75 mg Oral Q breakfast  . dutasteride  0.5 mg Oral Daily   And  . tamsulosin  0.4 mg Oral Daily  . metoprolol tartrate  50 mg Oral BID  . polyethylene glycol  17 g Oral Daily  . Rivaroxaban  15 mg Oral Q supper   Continuous Infusions: . amiodarone 30 mg/hr (06/21/17 0503)  . diltiazem (CARDIZEM) infusion Stopped (06/21/17 0214)     Radiological Exams on Admission: Dg Chest 1 View  Result Date: 06/20/2017 CLINICAL DATA:  Pain following fall EXAM: CHEST 1 VIEW COMPARISON:  March 08, 2017 FINDINGS: There is scarring in the right lower lobe region. There is no edema or consolidation. Heart is upper normal in size with pulmonary vascularity within normal limits. There is aortic atherosclerosis. No adenopathy. No pneumothorax. No bone lesions. IMPRESSION: Scarring right lower lobe. No edema or consolidation. Stable cardiac silhouette. There is aortic atherosclerosis. Aortic Atherosclerosis (ICD10-I70.0). Electronically Signed   By: Lowella Grip III M.D.   On: 06/20/2017 11:32   Ct Head Wo Contrast  Result Date: 06/20/2017 CLINICAL DATA:  Dizziness and pain following recent fall EXAM: CT HEAD WITHOUT CONTRAST CT CERVICAL SPINE WITHOUT CONTRAST TECHNIQUE: Multidetector CT imaging of the head and cervical spine was performed following the standard  protocol without intravenous contrast. Multiplanar CT image reconstructions of the cervical spine were also generated. COMPARISON:  CT head and CT cervical spine June 17, 2017 FINDINGS: CT HEAD FINDINGS Brain: There is persistent diffuse cerebral atrophy. There is no demonstrable mass, hemorrhage, extra-axial fluid collection, or midline shift. Widespread small vessel disease throughout the centra semiovale is stable. Small vessel disease is noted in each external capsule anteriorly. No acute infarct appreciable. Vascular: No hyperdense vessel. There is calcification in each carotid siphon and distal vertebral artery region. Skull: Bony calvarium appears intact. Fractures of the anterior left and right nasal bones remain stable compared to recent CT. No new fracture evident. Sinuses/Orbits: There is mucosal thickening in several ethmoid air cells bilaterally. There is a retention cyst in the inferior right maxillary antrum. There is a small retention cyst in the anterior left maxillary antrum. Orbits appear symmetric bilaterally. Other: Mastoid air cells are clear. CT CERVICAL SPINE FINDINGS Alignment: There is stable 2 mm retrolisthesis of C3  on C4. No new spondylolisthesis. Skull base and vertebrae: Skull base and craniocervical junction regions appear normal. There is mild pannus posterior to the odontoid which is not causing impression on the craniocervical junction. There is no demonstrable fracture. No blastic or lytic bone lesions. Soft tissues and spinal canal: Prevertebral soft tissues and predental space regions are normal. No paraspinous lesions are evident. There is no cord or canal hematoma. Disc levels: There is moderately severe disc space narrowing at C3-4. There is mild disc space narrowing at C5-6. There is facet hypertrophy at several levels without appreciable nerve root edema or effacement. No disc extrusion or stenosis. Upper chest: There is mild scarring in the lung apices. No upper lung zone edema or consolidation. Other: There is atherosclerotic calcification in both subclavian arteries as well as in both carotid and vertebral arteries. Calcification appears fairly extensive in the extracranial carotid arteries. IMPRESSION: CT head: Diffuse atrophy with extensive supratentorial small vessel disease, unchanged compared to recent study. No mass, hemorrhage, or extra-axial fluid collection. Foci of arterial vascular calcification noted. Nasal bone fracture stable compared to recent study. Areas of paranasal sinus disease noted. CT cervical spine: No fracture. Mild spondylolisthesis at C3-4 is felt to be due to underlying spondylosis. There is osteoarthritic changes several levels. There is extensive vascular calcification, most notably in the extracranial carotid arteries bilaterally. Electronically Signed   By: Lowella Grip III M.D.   On: 06/20/2017 11:04   Ct Cervical Spine Wo Contrast  Result Date: 06/20/2017 CLINICAL DATA:  Dizziness and pain following recent fall EXAM: CT HEAD WITHOUT CONTRAST CT CERVICAL SPINE WITHOUT CONTRAST TECHNIQUE: Multidetector CT imaging of the head and cervical spine was performed following  the standard protocol without intravenous contrast. Multiplanar CT image reconstructions of the cervical spine were also generated. COMPARISON:  CT head and CT cervical spine June 17, 2017 FINDINGS: CT HEAD FINDINGS Brain: There is persistent diffuse cerebral atrophy. There is no demonstrable mass, hemorrhage, extra-axial fluid collection, or midline shift. Widespread small vessel disease throughout the centra semiovale is stable. Small vessel disease is noted in each external capsule anteriorly. No acute infarct appreciable. Vascular: No hyperdense vessel. There is calcification in each carotid siphon and distal vertebral artery region. Skull: Bony calvarium appears intact. Fractures of the anterior left and right nasal bones remain stable compared to recent CT. No new fracture evident. Sinuses/Orbits: There is mucosal thickening in several ethmoid air cells bilaterally. There is a retention cyst in the inferior right maxillary antrum. There  is a small retention cyst in the anterior left maxillary antrum. Orbits appear symmetric bilaterally. Other: Mastoid air cells are clear. CT CERVICAL SPINE FINDINGS Alignment: There is stable 2 mm retrolisthesis of C3 on C4. No new spondylolisthesis. Skull base and vertebrae: Skull base and craniocervical junction regions appear normal. There is mild pannus posterior to the odontoid which is not causing impression on the craniocervical junction. There is no demonstrable fracture. No blastic or lytic bone lesions. Soft tissues and spinal canal: Prevertebral soft tissues and predental space regions are normal. No paraspinous lesions are evident. There is no cord or canal hematoma. Disc levels: There is moderately severe disc space narrowing at C3-4. There is mild disc space narrowing at C5-6. There is facet hypertrophy at several levels without appreciable nerve root edema or effacement. No disc extrusion or stenosis. Upper chest: There is mild scarring in the lung apices. No  upper lung zone edema or consolidation. Other: There is atherosclerotic calcification in both subclavian arteries as well as in both carotid and vertebral arteries. Calcification appears fairly extensive in the extracranial carotid arteries. IMPRESSION: CT head: Diffuse atrophy with extensive supratentorial small vessel disease, unchanged compared to recent study. No mass, hemorrhage, or extra-axial fluid collection. Foci of arterial vascular calcification noted. Nasal bone fracture stable compared to recent study. Areas of paranasal sinus disease noted. CT cervical spine: No fracture. Mild spondylolisthesis at C3-4 is felt to be due to underlying spondylosis. There is osteoarthritic changes several levels. There is extensive vascular calcification, most notably in the extracranial carotid arteries bilaterally. Electronically Signed   By: Lowella Grip III M.D.   On: 06/20/2017 11:04   US Renal  Result Date: 06/21/2017 CLINICAL DATA:  Oliguria. EXAM: RENAL / URINARY TRACT ULTRASOUND COMPLETE COMPARISON:  None. FINDINGS: Right Kidney: Length: 9.8 cm. Echogenicity is within normal limits. No mass or hydronephrosis visualized. Left Kidney: Length: 10.9 cm. Echogenicity within normal limits. No hydronephrosis visualized. 2.5 cm simple cyst is seen in lower pole. Bladder: Decompressed secondary to Foley catheter. Severely enlarged prostate gland is again noted, measuring 6.8 x 6.2 x 4.9 cm. IMPRESSION: No hydronephrosis or renal obstruction is noted. Severely enlarged prostate gland is again noted as described on prior CT scan. Electronically Signed   By: Marijo Conception, M.D.   On: 06/21/2017 09:24   Dg Knee Complete 4 Views Left  Result Date: 06/20/2017 CLINICAL DATA:  Fall.  Anterior knee pain, left greater than right. EXAM: LEFT KNEE - COMPLETE 4+ VIEW; RIGHT KNEE - COMPLETE 4+ VIEW COMPARISON:  None. FINDINGS: Mild degenerative changes are most evident in the medial and femoral compartments bilaterally.  A small right-sided joint effusion is present. Chondrocalcinosis is present in the menisci bilaterally. Vascular calcifications are present. The joints are located bilaterally. No acute osseous abnormalities are present. IMPRESSION: 1. Bilateral chondrocalcinosis compatible with CPPD arthropathy. 2. Mild osseous degenerative changes in the medial and patellofemoral compartments bilaterally. 3. No acute osseous abnormality. 4. Extensive vascular calcifications.  Atherosclerosis. Electronically Signed   By: San Morelle M.D.   On: 06/20/2017 11:34   Dg Knee Complete 4 Views Right  Result Date: 06/20/2017 CLINICAL DATA:  Fall.  Anterior knee pain, left greater than right. EXAM: LEFT KNEE - COMPLETE 4+ VIEW; RIGHT KNEE - COMPLETE 4+ VIEW COMPARISON:  None. FINDINGS: Mild degenerative changes are most evident in the medial and femoral compartments bilaterally. A small right-sided joint effusion is present. Chondrocalcinosis is present in the menisci bilaterally. Vascular calcifications are present. The joints  are located bilaterally. No acute osseous abnormalities are present. IMPRESSION: 1. Bilateral chondrocalcinosis compatible with CPPD arthropathy. 2. Mild osseous degenerative changes in the medial and patellofemoral compartments bilaterally. 3. No acute osseous abnormality. 4. Extensive vascular calcifications.  Atherosclerosis. Electronically Signed   By: San Morelle M.D.   On: 06/20/2017 11:34    Impression/Recommendations Principal Problem:   Atrial fibrillation with rapid ventricular response (HCC) Active Problems:   Dementia   Hyperglycemia   BPH (benign prostatic hyperplasia)   Dizziness  Afib -This issue is being cared for by the cardiology team -He appears to still be on both Amiodarone and Cardizem -He has been taking Eliquis as an outpatient -With his recurrent falls and dizziness, would suggest consideration of risk:benefit from Eliquis and consider discontinuation of  AC at some point in the near future  Dizziness -With ataxia, dementia, and some incontinence, NPH was entertained - particularly since this may be a way to improve the dementia -Patient discussed with Dr. Cheral Marker and CT reviewed.  CT does not appear to be consistent with NPH and instead appears to show diffuse atrophy.   -Postural hypotension and dizziness are both commonly associated with dementia. -Will order orthostatic BPs.  Dementia -By exam, the patient does appear to have mild to moderate dementia which is likely to benefit from further outpatient consideration.   -Suspect that he would benefit from medication, although his wife is upset by this possible diagnosis since her sister also had dementia -Will need ongoing support and close outpatient f/u  BPH -Patient with known BPH -Has had good response to [place] -Developed an apparent obstructive uropathy yesterday necessitating foley placement -Acute kidney injury developed as a result -Foley is draining -Would expect resolution of AKI with obstruction resolution -Urology to see  Hyperglycemia -May be stress response -Will follow with fasting AM labs -It is unlikely that he will need acute or chronic treatment for this issue   Thank you for this consultation.  Our [facility] hospitalist team will follow the patient with you.   Time Spent: 56 minutes  Karmen Bongo M.D. [facility] 06/21/2017, 3:19 PM

## 2017-06-21 NOTE — Progress Notes (Signed)
Pt unable to void after multiple attempts to spontaneously void. Bladder scan showed greater than 830. In and out attempted but failed d/t enlarged prostate. Paged Dr. Virgina Jock for orders. Verbal order received to place coude catheter for urinary retention.  Jacqlyn Larsen, RN

## 2017-06-21 NOTE — Consult Note (Signed)
[facility] CM Primary Care Navigator  06/21/2017  Jeremy Macdonald January 21, 1926 [id number]   Met with patientand wife (Jeremy Macdonald) at the bedside to identify possible discharge needs. Patient states having "dizziness, lack of energy and had a fall" that resulted to this admission.  Patientendorses Jeremy Macdonald with [facility] [place] care provider.   [facility] [place] [facility].  Patient has been managing his medications at home with wife's assistance using "pill box" system filled once a week.  Patient reports that he was driving prior to admission but wife is able to provide transportation to his doctors'appointments after discharge.  Patient and wife lives together at home and helps each other with their care needs as stated. Their son Jeremy Macdonald- who lives nearby) is able to assist them at home when needed.  Discharge plan is to be determined pending PT/OT evaluation and recommendation. Wife mentioned that they are considering placement for rehabilitation and possible long-term care since she is not physically able to assist patient at home.  Patient and wife voiced understanding of need to call primary care provider's office if he returns backhome, for a post discharge follow-up appointment within1-2weeksor sooner if needs arise.Patient letter (with PCP's contact number) was provided as a reminder.  Discussed withpatientand wife about [facility] CM services available forhealthmanagement at home but indicated having no other needs or concerns for now except for placement and both understand that Inpatient social worker will assist them with that need.   Explained and encouraged patient and wife to seekreferral to [facility] care management from primary care provider if deemed necessary andappropriate for any services in the future (once he will return  back home).  [facility] care management information provided for future needs thathemay have.    For questions, please contact:  Jeremy Macdonald, BSN, RN- [facility] Primary Care Navigator  Telephone: [phone] [place]

## 2017-06-22 DIAGNOSIS — R531 Weakness: Secondary | ICD-10-CM

## 2017-06-22 DIAGNOSIS — F0390 Unspecified dementia without behavioral disturbance: Secondary | ICD-10-CM

## 2017-06-22 DIAGNOSIS — R739 Hyperglycemia, unspecified: Secondary | ICD-10-CM

## 2017-06-22 DIAGNOSIS — W19XXXD Unspecified fall, subsequent encounter: Secondary | ICD-10-CM

## 2017-06-22 DIAGNOSIS — R338 Other retention of urine: Secondary | ICD-10-CM

## 2017-06-22 DIAGNOSIS — N401 Enlarged prostate with lower urinary tract symptoms: Secondary | ICD-10-CM

## 2017-06-22 DIAGNOSIS — R34 Anuria and oliguria: Secondary | ICD-10-CM

## 2017-06-22 LAB — BASIC METABOLIC PANEL
Anion gap: 10 (ref 5–15)
BUN: 19 mg/dL (ref 6–20)
CHLORIDE: 100 mmol/L — AB (ref 101–111)
CO2: 27 mmol/L (ref 22–32)
Calcium: 8.3 mg/dL — ABNORMAL LOW (ref 8.9–10.3)
Creatinine, Ser: 1.02 mg/dL (ref 0.61–1.24)
GFR calc non Af Amer: >60 mL/min (ref >60)
Glucose, Bld: 123 mg/dL — ABNORMAL HIGH (ref 65–99)
Potassium: 3.4 mmol/L — ABNORMAL LOW (ref 3.5–5.1)
Sodium: 137 mmol/L (ref 135–145)

## 2017-06-22 LAB — CBC
HEMATOCRIT: 39 % (ref 39.0–52.0)
HEMOGLOBIN: 12.3 g/dL — AB (ref 13.0–17.0)
MCH: 30.5 pg (ref 26.0–34.0)
MCHC: 31.5 g/dL (ref 30.0–36.0)
MCV: 96.8 fL (ref 78.0–100.0)
PLATELETS: 144 10*3/uL — AB (ref 150–400)
RBC: 4.03 MIL/uL — AB (ref 4.22–5.81)
RDW: 14.5 % (ref 11.5–15.5)
WBC: 9 10*3/uL (ref 4.0–10.5)

## 2017-06-22 MED ORDER — AMIODARONE HCL 200 MG PO TABS
200.0000 mg | ORAL_TABLET | Freq: Every day | ORAL | Status: DC
Start: 2017-06-22 — End: 2017-06-22
  Administered 2017-06-22: 200 mg via ORAL
  Filled 2017-06-22: qty 1

## 2017-06-22 MED ORDER — AMIODARONE HCL 200 MG PO TABS
200.0000 mg | ORAL_TABLET | Freq: Three times a day (TID) | ORAL | Status: DC
  Administered 2017-06-22 – 2017-06-25 (×9): 200 mg via ORAL
  Filled 2017-06-22 (×9): qty 1

## 2017-06-22 MED ORDER — DILTIAZEM HCL 30 MG PO TABS
30.0000 mg | ORAL_TABLET | Freq: Three times a day (TID) | ORAL | Status: DC
  Administered 2017-06-22 – 2017-06-25 (×8): 30 mg via ORAL
  Filled 2017-06-22 (×9): qty 1

## 2017-06-22 MED ORDER — POTASSIUM CHLORIDE CRYS ER 20 MEQ PO TBCR
40.0000 meq | EXTENDED_RELEASE_TABLET | Freq: Two times a day (BID) | ORAL | Status: AC
  Administered 2017-06-22 (×2): 40 meq via ORAL
  Filled 2017-06-22 (×2): qty 2

## 2017-06-22 NOTE — Progress Notes (Signed)
Subjective:  Feels very weak. Has not walked yet. No chest pain. Dyspnea is stable  Objective:  Vital Signs in the last 24 hours: Temp:  [97.6 F (36.4 C)-98.4 F (36.9 C)] 97.6 F (36.4 C) (01/18 0425) Pulse Rate:  [77-128] 113 (01/18 0901) Resp:  [16-20] 17 (01/18 0425) BP: (91-141)/(64-102) 140/102 (01/18 0901) SpO2:  [91 %-100 %] 100 % (01/18 0425) Weight:  [80.9 kg (178 lb 6.4 oz)] 80.9 kg (178 lb 6.4 oz) (01/18 0425)  Intake/Output from previous day: 01/17 0701 - 01/18 0700 In: 190.2 [P.O.:100; I.V.:90.2] Out: 450 [Urine:450]  Physical Exam:  General appearance: alert, cooperative, appears stated age and no distress Eyes: negative findings: lids and lashes normal Neck: extensive ecchymosis of the face and neck noted. JVD  elevated about 5-6 cm above the sternoclavicular joint.. Neck: JVP - normal, carotids 2+= without bruits Resp: bilateral expiratory wheezing, diffuse, bilateral scattered rhonchi present. Chest wall: no tenderness Cardio: S1 variable, S2 normal, distant heart sounds, no murmur appreciated. GI: soft, non-tender; bowel sounds normal; no masses,  no organomegaly Extremities: extremities normal, atraumatic, no cyanosis or edema    Lab Results: BMP Recent Labs    06/20/17 1033 06/21/17 0901 06/22/17 0527  NA 135 135 137  K 4.0 3.7 3.4*  CL 99* 98* 100*  CO2 '23 24 27  '$ GLUCOSE 116* 163* 123*  BUN '13 18 19  '$ CREATININE 1.07 1.57* 1.02  CALCIUM 8.7* 7.9* 8.3*  GFRNONAA 59* 37* >60  GFRAA >60 43* >60    CBC Recent Labs  Lab 06/20/17 1033  06/22/17 0527  WBC 11.2*   < > 9.0  RBC 4.40   < > 4.03*  HGB 13.6   < > 12.3*  HCT 42.8   < > 39.0  PLT 149*   < > 144*  MCV 97.3   < > 96.8  MCH 30.9   < > 30.5  MCHC 31.8   < > 31.5  RDW 14.7   < > 14.5  LYMPHSABS 0.8  --   --   MONOABS 0.9  --   --   EOSABS 0.0  --   --   BASOSABS 0.0  --   --    < > = values in this interval not displayed.    HEMOGLOBIN A1C No results found for: HGBA1C,  MPG  Cardiac Panel (last 3 results) Recent Labs    06/20/17 1033 06/20/17 1233  CKTOTAL 90  --   TROPONINI  --  0.03*    BNP (last 3 results) No results for input(s): PROBNP in the last 8760 hours.  TSH No results for input(s): TSH in the last 8760 hours.  Lipid Panel  No results found for: CHOL, TRIG, HDL, CHOLHDL, VLDL, LDLCALC, LDLDIRECT   Hepatic Function Panel Recent Labs    06/20/17 1033  PROT 6.3*  ALBUMIN 2.8*  AST 16  ALT 15*  ALKPHOS 68  BILITOT 1.9*    Imaging: Dg Chest 1 View  Result Date: 06/20/2017 CLINICAL DATA:  Pain following fall EXAM: CHEST 1 VIEW COMPARISON:  March 08, 2017 FINDINGS: There is scarring in the right lower lobe region. There is no edema or consolidation. Heart is upper normal in size with pulmonary vascularity within normal limits. There is aortic atherosclerosis. No adenopathy. No pneumothorax. No bone lesions. IMPRESSION: Scarring right lower lobe. No edema or consolidation. Stable cardiac silhouette. There is aortic atherosclerosis. Aortic Atherosclerosis (ICD10-I70.0). Electronically Signed   By: Lowella Grip III M.D.  On: 06/20/2017 11:32   US Renal  Result Date: 06/21/2017 CLINICAL DATA:  Oliguria. EXAM: RENAL / URINARY TRACT ULTRASOUND COMPLETE COMPARISON:  None. FINDINGS: Right Kidney: Length: 9.8 cm. Echogenicity is within normal limits. No mass or hydronephrosis visualized. Left Kidney: Length: 10.9 cm. Echogenicity within normal limits. No hydronephrosis visualized. 2.5 cm simple cyst is seen in lower pole. Bladder: Decompressed secondary to Foley catheter. Severely enlarged prostate gland is again noted, measuring 6.8 x 6.2 x 4.9 cm. IMPRESSION: No hydronephrosis or renal obstruction is noted. Severely enlarged prostate gland is again noted as described on prior CT scan. Electronically Signed   By: Marijo Conception, M.D.   On: 06/21/2017 09:24   Dg Knee Complete 4 Views Left  Result Date: 06/20/2017 CLINICAL DATA:  Fall.   Anterior knee pain, left greater than right. EXAM: LEFT KNEE - COMPLETE 4+ VIEW; RIGHT KNEE - COMPLETE 4+ VIEW COMPARISON:  None. FINDINGS: Mild degenerative changes are most evident in the medial and femoral compartments bilaterally. A small right-sided joint effusion is present. Chondrocalcinosis is present in the menisci bilaterally. Vascular calcifications are present. The joints are located bilaterally. No acute osseous abnormalities are present. IMPRESSION: 1. Bilateral chondrocalcinosis compatible with CPPD arthropathy. 2. Mild osseous degenerative changes in the medial and patellofemoral compartments bilaterally. 3. No acute osseous abnormality. 4. Extensive vascular calcifications.  Atherosclerosis. Electronically Signed   By: San Morelle M.D.   On: 06/20/2017 11:34   Dg Knee Complete 4 Views Right  Result Date: 06/20/2017 CLINICAL DATA:  Fall.  Anterior knee pain, left greater than right. EXAM: LEFT KNEE - COMPLETE 4+ VIEW; RIGHT KNEE - COMPLETE 4+ VIEW COMPARISON:  None. FINDINGS: Mild degenerative changes are most evident in the medial and femoral compartments bilaterally. A small right-sided joint effusion is present. Chondrocalcinosis is present in the menisci bilaterally. Vascular calcifications are present. The joints are located bilaterally. No acute osseous abnormalities are present. IMPRESSION: 1. Bilateral chondrocalcinosis compatible with CPPD arthropathy. 2. Mild osseous degenerative changes in the medial and patellofemoral compartments bilaterally. 3. No acute osseous abnormality. 4. Extensive vascular calcifications.  Atherosclerosis. Electronically Signed   By: San Morelle M.D.   On: 06/20/2017 11:34    Cardiac Studies:  EKG 06/20/2017: Atrial fibrillation with rapid ventricle response at the rate of 124 bpm, normal axis. Poor R progression, probably normal variant, cannot exclude anteroseptal infarct old. Nonspecific T abnormality.   Assessment/Plan:  1. Afib  w/RVR, presently on IV amiodarone and also beta blocker. Heart rate much improved and controlled on telemetry. CHA2DS2VAsc score 4. Stroke risk 4% 2.CAD s/p LAD PCI 04/2017: stenting of the proximal LAD 04/24/2017: CSI atherectomy followed by DES stenting with 3.0 x 26 mm resolute Onyx followed by postdilatation with a 3.5 x 15 mm noncompliant balloon at 16 atm of pressure. 4. Abdominal aortic aneurysm 5. Hypertension 6. Tobacco abuse COPD 7. Severe malnutrition 8. Failure to thrive in an adult  Recommendation: Start patient on by mouth amiodarone 200 mg daily.Heart rate is much better. I will discontinue Plavix to reduce the risk of bleeding, intravenous remained stable.No prior history of fall, continue long-term anticoagulation with Eliquis. Does have stage III chronic kidney disease which has remained stable. Blood pressure is controlled. His dyspnea is also related to underlying COPD there is chronic. Patient extremely weak and failure to thrive in an adult, needs skilled nursing facility.Marland Kitchen Unsafe for discharge. From cardiac standpoint is stable. Will request hospitalist team to take over if okay with them.  Adrian Prows,  M.D. 06/22/2017, 10:59 AM [facility] Pager: [phone] Office: [phone] If no answer: [phone]

## 2017-06-22 NOTE — Progress Notes (Signed)
Pt HR remains elevated in the 130-160's at rest. This is occurred since pt has got out of bed with PT a hour ago. MD was paged and gave order for Amiodarone 200 mg tab TID and Diltiazem 30 mg PO TID. Will continue to monitor closely. Ferdinand Lango, RN

## 2017-06-22 NOTE — Progress Notes (Signed)
PROGRESS NOTE    [name]  MGN:[id number] DOB: 1925-09-04 DOA: 06/20/2017 PCP: Vernie Shanks, MD   Brief Narrative:  Jeremy Macdonald is an 84 y.o. male with h/o HTN; HLD; CAD; COPD; and afib.  He was admitted by the Cardiology service yesterday for Atrial Fib with RVR after suffering a fall on 1/13 with nasal fracture and multiple ecchymoses.  TRH was consulted regarding dizziness and generalized weakness.    He has had trouble walking.  This time, he was outside and he tripped over a curb and hit his head.  He was confused and so not a great historian but reportedthat he felt dizzy but none today.  He has been a bit more dizzy in the last 6 months.  His wife notes that this dizziness has been ongoing since he had the stent placed but she doesn't think that is related to his dizziness.  5ya he developed urinary retention; Infrequent incontinence.  They feel like the Langley Gauss has worked very well for his BPH.  He has had some dizziness while in the hospital. Dizziness is only when he is up. The dizziness has improved with rate control.  He did have urinary retention last night requiring a foley.  The urinary retention resulted in obstructive uropathy and acute renal failure; he is now again making urine albeit blood-tinged after traumatic foley insertion.  Urology has also been asked to consult on the patient.      We have been asked to take over for Cardiology as primary and will do so as he has other medical issues and for Placement. Cardiology will continue to follow the patient and appreciate their help in managing patient's HR.  Assessment & Plan:   Principal Problem:   Atrial fibrillation with rapid ventricular response (HCC) Active Problems:   Dementia   Hyperglycemia   BPH (benign prostatic hyperplasia)   Dizziness  Atiral Fibrillation with RVR -This issue is being cared for by the Cardiology team -He appears to still be on both Amiodarone and Cardizem; Was given more  Diltiazem 30 mg q8h and Amiodarone today (200 mg po TID) -He has been taking Xarelto as an outpatient -With his recurrent falls and dizziness, would suggest consideration of risk:benefit from Eliquis and consider discontinuation of AC at some point in the near future -Cardiology D/C'd Plavix to reduce bleeding risk   Dizziness/Generalized Weakeness -With ataxia, dementia, and some incontinence, NPH was entertained - particularly since this may be a way to improve the dementia -Patient discussed with Dr. Cheral Marker and CT reviewed.  CT does not appear to be consistent with NPH and instead appears to show diffuse atrophy.   -Postural hypotension and dizziness are both commonly associated with dementia. -Will order Orthostatic BPs. -PT/OT Recommending SNF -Will Consult Social Work for Placement   Dementia -By exam, the patient does appear to have mild to moderate dementia which is likely to benefit from further outpatient consideration.   -Suspect that he would benefit from medication, although his wife is upset by this possible diagnosis since her sister also had dementia -Will need ongoing support and close outpatient f/u with Neurology -May consider getting MRI  BPH -Patient with known BPH -Has had good response to [place] an apparent obstructive uropathy yesterday necessitating foley placement -Acute kidney injury developed as a result but improved -Foley is draining and has some blood tinged -C/w Avodart and Flomax -Would expect resolution of AKI with obstruction resolution -Urology to see and recommending continuing Foley and  if worse blood then get CBI -Will attempt TOV as an outpatient   Hyperglycemia -May be stress response -Will follow with fasting AM labs -It is unlikely that he will need acute or chronic treatment for this issue  Hypokalemia -Replete -Repeat CMP in AM   CAD s/p PCI -Cardiology discontinued Plavix -C/w Atorvastatin and Metoprolol   Hx of  COPD -Currently not exacerbation  DVT prophylaxis: Anticoagulated with Xarelto Code Status: FULL CODE Family Communication: Discussed with Neighbor at bedside Disposition Plan: Anticipate SNF  Consultants:   Cardiology  Urology   Procedures:  None   Antimicrobials:  Anti-infectives (From admission, onward)   None     Subjective: Seen and examined and felt weak. Denied Dizziness. No CP or SOB. No other concerns or complaints.   Objective: Vitals:   06/22/17 1650 06/22/17 1655 06/22/17 1659 06/22/17 1825  BP: 129/88 122/90 106/75 105/89  Pulse:      Resp:      Temp:      TempSrc:      SpO2:      Weight:      Height:        Intake/Output Summary (Last 24 hours) at 06/22/2017 2101 Last data filed at 06/22/2017 1925 Gross per 24 hour  Intake 930.18 ml  Output 850 ml  Net 80.18 ml   Filed Weights   06/20/17 1749 06/21/17 0629 06/22/17 0425  Weight: 70.1 kg (154 lb 8 oz) 75.8 kg (167 lb 1.7 oz) 80.9 kg (178 lb 6.4 oz)   Examination: Physical Exam:  Constitutional: Elderly Caucasian Male in NAD and appears calm  Eyes: Has significant bruising   ENMT: External Ears, Nose appear normal. Grossly normal hearing. Mucous membranes are moist. Has bruising on face Neck: Appears normal, supple, no cervical masses, normal ROM, no appreciable thyromegaly; Has bruising on neck Respiratory: Diminished to auscultation bilaterally, no wheezing, rales, rhonchi or crackles. Normal respiratory effort and patient is not tachypenic.  Cardiovascular: Irregularly Irregular, no murmurs / rubs / gallops. S1 and S2 auscultated. Mild extremity edema Abdomen: Soft, non-tender, non-distended. No masses palpated. No appreciable hepatosplenomegaly. Bowel sounds positive x4.  GU: Deferred. Musculoskeletal: No clubbing / cyanosis of digits/nails. No joint deformity upper and lower extremities.  Skin: Has significant facial bruising. No induration; Warm and dry.  Neurologic: CN 2-12 grossly  intact with no focal deficits. Strength 5/5 in all 4. Romberg sign cerebellar reflexes not assessed.  Psychiatric: Normal judgment and insight. Alert and awake. Mood and appropriate affect.   Data Reviewed: I have personally reviewed following labs and imaging studies  CBC: Recent Labs  Lab 06/20/17 1033 06/21/17 0901 06/22/17 0527  WBC 11.2* 10.2 9.0  NEUTROABS 9.4*  --   --   HGB 13.6 12.1* 12.3*  HCT 42.8 37.5* 39.0  MCV 97.3 96.2 96.8  PLT 149* 139* 161*   Basic Metabolic Panel: Recent Labs  Lab 06/20/17 1033 06/21/17 0901 06/22/17 0527  NA 135 135 137  K 4.0 3.7 3.4*  CL 99* 98* 100*  CO2 23 24 27   GLUCOSE 116* 163* 123*  BUN 13 18 19   CREATININE 1.07 1.57* 1.02  CALCIUM 8.7* 7.9* 8.3*   GFR: Estimated Creatinine Clearance: 49.1 mL/min (by C-G formula based on SCr of 1.02 mg/dL). Liver Function Tests: Recent Labs  Lab 06/20/17 1033  AST 16  ALT 15*  ALKPHOS 68  BILITOT 1.9*  PROT 6.3*  ALBUMIN 2.8*   No results for input(s): LIPASE, AMYLASE in the last 168 hours. No  results for input(s): AMMONIA in the last 168 hours. Coagulation Profile: No results for input(s): INR, PROTIME in the last 168 hours. Cardiac Enzymes: Recent Labs  Lab 06/20/17 1033 06/20/17 1233  CKTOTAL 90  --   TROPONINI  --  0.03*   BNP (last 3 results) No results for input(s): PROBNP in the last 8760 hours. HbA1C: No results for input(s): HGBA1C in the last 72 hours. CBG: No results for input(s): GLUCAP in the last 168 hours. Lipid Profile: No results for input(s): CHOL, HDL, LDLCALC, TRIG, CHOLHDL, LDLDIRECT in the last 72 hours. Thyroid Function Tests: No results for input(s): TSH, T4TOTAL, FREET4, T3FREE, THYROIDAB in the last 72 hours. Anemia Panel: No results for input(s): VITAMINB12, FOLATE, FERRITIN, TIBC, IRON, RETICCTPCT in the last 72 hours. Sepsis Labs: No results for input(s): PROCALCITON, LATICACIDVEN in the last 168 hours.  Recent Results (from the past 240  hour(s))  MRSA PCR Screening     Status: None   Collection Time: 06/20/17  5:46 PM  Result Value Ref Range Status   MRSA by PCR NEGATIVE NEGATIVE Final    Comment:        The GeneXpert MRSA Assay (FDA approved for NASAL specimens only), is one component of a comprehensive MRSA colonization surveillance program. It is not intended to diagnose MRSA infection nor to guide or monitor treatment for MRSA infections.     Radiology Studies: US Renal  Result Date: 06/21/2017 CLINICAL DATA:  Oliguria. EXAM: RENAL / URINARY TRACT ULTRASOUND COMPLETE COMPARISON:  None. FINDINGS: Right Kidney: Length: 9.8 cm. Echogenicity is within normal limits. No mass or hydronephrosis visualized. Left Kidney: Length: 10.9 cm. Echogenicity within normal limits. No hydronephrosis visualized. 2.5 cm simple cyst is seen in lower pole. Bladder: Decompressed secondary to Foley catheter. Severely enlarged prostate gland is again noted, measuring 6.8 x 6.2 x 4.9 cm. IMPRESSION: No hydronephrosis or renal obstruction is noted. Severely enlarged prostate gland is again noted as described on prior CT scan. Electronically Signed   By: Marijo Conception, M.D.   On: 06/21/2017 09:24   Scheduled Meds: . amiodarone  200 mg Oral TID  . atorvastatin  10 mg Oral Q2000  . diltiazem  30 mg Oral Q8H  . dutasteride  0.5 mg Oral Daily   And  . tamsulosin  0.4 mg Oral Daily  . metoprolol tartrate  50 mg Oral BID  . polyethylene glycol  17 g Oral Daily  . potassium chloride  40 mEq Oral BID  . Rivaroxaban  15 mg Oral Q supper   Continuous Infusions:   LOS: 2 days   Kerney Elbe, DO [facility] Pager [phone]  If 7PM-7AM, please contact night-coverage [email or web address] Password [email or web address] 06/22/2017, 9:01 PM

## 2017-06-22 NOTE — Evaluation (Signed)
Physical Therapy Evaluation Patient Details Name: Jeremy Macdonald MRN: [id number] DOB: 1926-02-02 Today's Date: 06/22/2017   History of Present Illness  Pt is an 84 y/o male admitted secondary to fatigue dizziness. Pt also with fall at home which resulted in nasal bone fracture. CT of the spine negative for acute abnormality. CT of the head showed diffuse atrophy with extensive small vessel disease. PMH includes Dementia, a fib, CAD s/p stent placement, CKD, COPD, HTN, AAA.   Clinical Impression  Pt admitted secondary to problem above with deficits below. RN cleared prior to session for PT to perform mobility to check orthostatics; see vitals flowsheet. Required gross mod A +2 for mobility. Upon standing, pt with HR briefly up to 169 bpm, so returned to supine; RN notified. Otherwise HR ranged from low 120's to mid 140s with mobility. Pt only reporting mild lightheadedness; otherwise asymptomatic. Will continue to follow acutely to maximize functional mobility independence and safety and progress mobility as pt is medically able.      Follow Up Recommendations SNF;Supervision/Assistance - 24 hour    Equipment Recommendations  Rolling walker with 5" wheels;3in1 (PT)    Recommendations for Other Services       Precautions / Restrictions Precautions Precautions: Fall;Other (comment) Precaution Comments: Watch HR; had a fall PTA      Mobility  Bed Mobility Overal bed mobility: Needs Assistance Bed Mobility: Supine to Sit;Sit to Supine     Supine to sit: Mod assist;+2 for physical assistance Sit to supine: Mod assist;+2 for physical assistance   General bed mobility comments: Mod A +2 to come to sitting. HR ranged from low 120's to low 130s in sitting. Required mod A +2 to assist with return to supine.   Transfers Overall transfer level: Needs assistance Equipment used: Rolling walker (2 wheeled) Transfers: Sit to/from Stand Sit to Stand: Mod assist;+2 physical assistance          General transfer comment: Mod A +2 to stand for lift assist and steadying assist. Slightly orthostatic upon standing. Pt's HR elevated to 169 at highest upon standing, therefore returned pt to sitting and then to supine. Notified RN. HR ranged from 120-low 130s upon return to supine. Pt complained of some lightheadedness, however, otherwise asymptomatic.   Ambulation/Gait             General Gait Details: NT secondary to elevated HR  Stairs            Wheelchair Mobility    Modified Rankin (Stroke Patients Only)       Balance Overall balance assessment: Needs assistance Sitting-balance support: No upper extremity supported;Feet supported Sitting balance-Leahy Scale: Fair     Standing balance support: Bilateral upper extremity supported;During functional activity Standing balance-Leahy Scale: Poor Standing balance comment: Reliant on BUE support                              Pertinent Vitals/Pain Pain Assessment: Faces Faces Pain Scale: Hurts little more Pain Location: neck  Pain Descriptors / Indicators: Grimacing;Guarding Pain Intervention(s): Limited activity within patient's tolerance;Monitored during session;Repositioned    Home Living Family/patient expects to be discharged to:: Skilled nursing facility Living Arrangements: Spouse/significant other Available Help at Discharge: Family;Available 24 hours/day Type of Home: Apartment Home Access: Level entry     Home Layout: One level Home Equipment: Walker - standard      Prior Function Level of Independence: Independent with assistive device(s)  Comments: USed standard walker for ambulation      Hand Dominance        Extremity/Trunk Assessment   Upper Extremity Assessment Upper Extremity Assessment: Defer to OT evaluation    Lower Extremity Assessment Lower Extremity Assessment: Generalized weakness    Cervical / Trunk Assessment Cervical / Trunk Assessment:  Kyphotic  Communication   Communication: No difficulties  Cognition Arousal/Alertness: Awake/alert Behavior During Therapy: WFL for tasks assessed/performed Overall Cognitive Status: History of cognitive impairments - at baseline                                 General Comments: History of dementia      General Comments General comments (skin integrity, edema, etc.): RN cleared for participation to perform orthostatics. Performed orthostatics and nursing student present to record.     Exercises     Assessment/Plan    PT Assessment Patient needs continued PT services  PT Problem List Cardiopulmonary status limiting activity;Decreased cognition;Decreased knowledge of use of DME;Decreased knowledge of precautions;Decreased mobility;Decreased balance;Decreased activity tolerance;Decreased strength;Pain       PT Treatment Interventions DME instruction;Gait training;Functional mobility training;Therapeutic activities;Therapeutic exercise;Balance training;Neuromuscular re-education;Cognitive remediation;Patient/family education    PT Goals (Current goals can be found in the Care Plan section)  Acute Rehab PT Goals Patient Stated Goal: to get up and moving  PT Goal Formulation: With patient Time For Goal Achievement: 07/06/17 Potential to Achieve Goals: Fair    Frequency Min 2X/week   Barriers to discharge        Co-evaluation               AM-PAC PT "6 Clicks" Daily Activity  Outcome Measure Difficulty turning over in bed (including adjusting bedclothes, sheets and blankets)?: Unable Difficulty moving from lying on back to sitting on the side of the bed? : Unable Difficulty sitting down on and standing up from a chair with arms (e.g., wheelchair, bedside commode, etc,.)?: Unable Help needed moving to and from a bed to chair (including a wheelchair)?: A Lot Help needed walking in hospital room?: A Lot Help needed climbing 3-5 steps with a railing? :  Total 6 Click Score: 8    End of Session Equipment Utilized During Treatment: Gait belt Activity Tolerance: Treatment limited secondary to medical complications (Comment)(elevated HR. ) Patient left: in bed;with call bell/phone within reach;with nursing/sitter in room Nurse Communication: Mobility status;Other (comment)(elevated HR ) PT Visit Diagnosis: History of falling (Z91.81);Unsteadiness on feet (R26.81);Muscle weakness (generalized) (M62.81)    Time: 0802-2336 PT Time Calculation (min) (ACUTE ONLY): 30 min   Charges:   PT Evaluation $PT Eval Moderate Complexity: 1 Mod PT Treatments $Therapeutic Activity: 8-22 mins   PT G Codes:        Leighton Ruff, PT, DPT  Acute Rehabilitation Services  Pager: [phone]   Rudean Hitt 06/22/2017, 6:43 PM

## 2017-06-22 NOTE — Consult Note (Signed)
Urology Consult  Referring physician: Dr. Virgina Jock Reason for referral: gross hematuria, urinary retention  Chief Complaint: Gross hematuria  History of Present Illness: Mr Jeremy Macdonald is a 84yo with a hx of BPH, Afib admitted with dizziness, fatigue and falls. At the time of admission he was found to be in urinary retention and a foley catheter was placed. His urinary was bloody after foley placement. The patient was followup by Dr. Gaynelle Arabian for BPH and was on [place]. Per the patients wife he has had worsening difficulty with urination, specifically increase urinary urgency, urinary frequency, and nocturia 5-6x. Currently he has mild penile discomfort with the foley in place. Urine is light red and the foley is draining well.   Past Medical History:  Diagnosis Date  . Abdominal aortic aneurysm (AAA) ([place])   . Arthritis    "shoulders" (04/24/2017)  . Atrial fibrillation with RVR ([place]) 03/2017   [name] 03/08/2017  . COPD (chronic obstructive pulmonary disease) ([place])   . Coronary artery disease   . Dementia   . Fall at home 06/17/2017   nasal fracture  . High cholesterol   . HOH (hard of hearing)   . Hypertension   . Pneumonia ~ 2003-2008 X 5   "5 straight years S/P pneumonia shot in ~ 2003"(04/24/2017)  . Skin cancer 2018   "cut it out of right arm"   Past Surgical History:  Procedure Laterality Date  . APPENDECTOMY    . CARDIAC CATHETERIZATION    . CATARACT EXTRACTION, BILATERAL Bilateral   . CORONARY ANGIOPLASTY WITH STENT PLACEMENT    . CORONARY ATHERECTOMY N/A 04/24/2017   Procedure: CORONARY ATHERECTOMY;  Surgeon: Adrian Prows, MD;  Location: [facility];  Service: Cardiovascular;  Laterality: N/A;  . CORONARY STENT INTERVENTION N/A 04/24/2017   Procedure: CORONARY STENT INTERVENTION;  Surgeon: Adrian Prows, MD;  Location: [facility];  Service: Cardiovascular;  Laterality: N/A;  . INCISION AND DRAINAGE  1950s   "RLE; got hurt in service; had to get a bunch of  stuff out"  . LEFT HEART CATH AND CORONARY ANGIOGRAPHY N/A 03/19/2017   Procedure: LEFT HEART CATH AND CORONARY ANGIOGRAPHY;  Surgeon: Adrian Prows, MD;  Location: [facility];  Service: Cardiovascular;  Laterality: N/A;  . SKIN CANCER EXCISION Right 2018   "arms"  . TOOTH EXTRACTION      Medications: I have reviewed the patient's current medications. Allergies:  Allergies  Allergen Reactions  . Phenergan [Promethazine Hcl] Other (See Comments)    Confusion     Family History  Problem Relation Age of Onset  . Heart attack Father   . Cancer Sister   . Lung cancer Brother    Social History:  reports that he has been smoking cigarettes.  He has a 6.50 pack-year smoking history. He has quit using smokeless tobacco. He reports that he does not drink alcohol or use drugs.  Review of Systems  Constitutional: Positive for malaise/fatigue.  Gastrointestinal: Positive for nausea.  Genitourinary: Positive for frequency and urgency.  Neurological: Positive for weakness.  All other systems reviewed and are negative.   Physical Exam:  Vital signs in last 24 hours: Temp:  [97.6 F (36.4 C)-98.3 F (36.8 C)] 97.6 F (36.4 C) (01/18 0425) Pulse Rate:  [88-128] 113 (01/18 0901) Resp:  [16-18] 17 (01/18 0425) BP: (91-140)/(64-102) 140/102 (01/18 0901) SpO2:  [95 %-100 %] 100 % (01/18 0425) Weight:  [80.9 kg (178 lb 6.4 oz)] 80.9 kg (178 lb 6.4 oz) (01/18 0425)  Physical Exam  Constitutional: He is oriented to person, place, and time. He appears well-developed and well-nourished.  HENT:  Head: Normocephalic.  Right Ear: External ear normal.  Left Ear: External ear normal.  Eyes: EOM are normal. Pupils are equal, round, and reactive to light.  Neck: Normal range of motion. No thyromegaly present.  Cardiovascular: Normal rate. A regularly irregular rhythm present.  Respiratory: Effort normal. No respiratory distress.  GI: Soft. He exhibits no distension and no mass. There is no  tenderness. There is no rebound and no guarding. Hernia confirmed negative in the right inguinal area and confirmed negative in the left inguinal area.  Genitourinary: Testes normal and penis normal. Circumcised. No penile tenderness.  Musculoskeletal: Normal range of motion. He exhibits no edema.  Lymphadenopathy:       Right: No inguinal adenopathy present.       Left: No inguinal adenopathy present.  Neurological: He is alert and oriented to person, place, and time.  Skin: Skin is warm and dry.  Psychiatric: He has a normal mood and affect. His behavior is normal. Judgment and thought content normal.    Laboratory Data:  Results for orders placed or performed during the hospital encounter of 06/20/17 (from the past 72 hour(s))  CBC with Differential     Status: Abnormal   Collection Time: 06/20/17 10:33 AM  Result Value Ref Range   WBC 11.2 (H) 4.0 - 10.5 K/uL   RBC 4.40 4.22 - 5.81 MIL/uL   Hemoglobin 13.6 13.0 - 17.0 g/dL   HCT 42.8 39.0 - 52.0 %   MCV 97.3 78.0 - 100.0 fL   MCH 30.9 26.0 - 34.0 pg   MCHC 31.8 30.0 - 36.0 g/dL   RDW 14.7 11.5 - 15.5 %   Platelets 149 (L) 150 - 400 K/uL   Neutrophils Relative % 85 %   Neutro Abs 9.4 (H) 1.7 - 7.7 K/uL   Lymphocytes Relative 7 %   Lymphs Abs 0.8 0.7 - 4.0 K/uL   Monocytes Relative 8 %   Monocytes Absolute 0.9 0.1 - 1.0 K/uL   Eosinophils Relative 0 %   Eosinophils Absolute 0.0 0.0 - 0.7 K/uL   Basophils Relative 0 %   Basophils Absolute 0.0 0.0 - 0.1 K/uL  Comprehensive metabolic panel     Status: Abnormal   Collection Time: 06/20/17 10:33 AM  Result Value Ref Range   Sodium 135 135 - 145 mmol/L   Potassium 4.0 3.5 - 5.1 mmol/L   Chloride 99 (L) 101 - 111 mmol/L   CO2 23 22 - 32 mmol/L   Glucose, Bld 116 (H) 65 - 99 mg/dL   BUN 13 6 - 20 mg/dL   Creatinine, Ser 1.07 0.61 - 1.24 mg/dL   Calcium 8.7 (L) 8.9 - 10.3 mg/dL   Total Protein 6.3 (L) 6.5 - 8.1 g/dL   Albumin 2.8 (L) 3.5 - 5.0 g/dL   AST 16 15 - 41 U/L   ALT  15 (L) 17 - 63 U/L   Alkaline Phosphatase 68 38 - 126 U/L   Total Bilirubin 1.9 (H) 0.3 - 1.2 mg/dL   GFR calc non Af Amer 59 (L) >60 mL/min   GFR calc Af Amer >60 >60 mL/min    Comment: (NOTE) The eGFR has been calculated using the CKD EPI equation. This calculation has not been validated in all clinical situations. eGFR's persistently <60 mL/min signify possible Chronic Kidney Disease.    Anion gap 13 5 - 15  Brain  natriuretic peptide     Status: Abnormal   Collection Time: 06/20/17 10:33 AM  Result Value Ref Range   B Natriuretic Peptide 806.8 (H) 0.0 - 100.0 pg/mL  CK     Status: None   Collection Time: 06/20/17 10:33 AM  Result Value Ref Range   Total CK 90 49 - 397 U/L  I-Stat Troponin, ED (not at [facility])     Status: None   Collection Time: 06/20/17 10:43 AM  Result Value Ref Range   Troponin i, poc 0.02 0.00 - 0.08 ng/mL   Comment 3            Comment: Due to the release kinetics of cTnI, a negative result within the first hours of the onset of symptoms does not rule out myocardial infarction with certainty. If myocardial infarction is still suspected, repeat the test at appropriate intervals.   Troponin I     Status: Abnormal   Collection Time: 06/20/17 12:33 PM  Result Value Ref Range   Troponin I 0.03 (HH) <0.03 ng/mL    Comment: CRITICAL RESULT CALLED TO, READ BACK BY AND VERIFIED WITH: FELTS,C RN [id number] 06/20/2017 BY A BENNETT   MRSA PCR Screening     Status: None   Collection Time: 06/20/17  5:46 PM  Result Value Ref Range   MRSA by PCR NEGATIVE NEGATIVE    Comment:        The GeneXpert MRSA Assay (FDA approved for NASAL specimens only), is one component of a comprehensive MRSA colonization surveillance program. It is not intended to diagnose MRSA infection nor to guide or monitor treatment for MRSA infections.   Urinalysis, Routine w reflex microscopic     Status: Abnormal   Collection Time: 06/20/17  6:57 PM  Result Value Ref Range   Color, Urine  YELLOW YELLOW   APPearance CLEAR CLEAR   Specific Gravity, Urine 1.013 1.005 - 1.030   pH 6.0 5.0 - 8.0   Glucose, UA NEGATIVE NEGATIVE mg/dL   Hgb urine dipstick SMALL (A) NEGATIVE   Bilirubin Urine NEGATIVE NEGATIVE   Ketones, ur 20 (A) NEGATIVE mg/dL   Protein, ur NEGATIVE NEGATIVE mg/dL   Nitrite NEGATIVE NEGATIVE   Leukocytes, UA NEGATIVE NEGATIVE   RBC / HPF 0-5 0 - 5 RBC/hpf   WBC, UA 0-5 0 - 5 WBC/hpf   Bacteria, UA RARE (A) NONE SEEN   Squamous Epithelial / LPF 0-5 (A) NONE SEEN   Mucus PRESENT   Basic metabolic panel     Status: Abnormal   Collection Time: 06/21/17  9:01 AM  Result Value Ref Range   Sodium 135 135 - 145 mmol/L   Potassium 3.7 3.5 - 5.1 mmol/L   Chloride 98 (L) 101 - 111 mmol/L   CO2 24 22 - 32 mmol/L   Glucose, Bld 163 (H) 65 - 99 mg/dL   BUN 18 6 - 20 mg/dL   Creatinine, Ser 1.57 (H) 0.61 - 1.24 mg/dL   Calcium 7.9 (L) 8.9 - 10.3 mg/dL   GFR calc non Af Amer 37 (L) >60 mL/min   GFR calc Af Amer 43 (L) >60 mL/min    Comment: (NOTE) The eGFR has been calculated using the CKD EPI equation. This calculation has not been validated in all clinical situations. eGFR's persistently <60 mL/min signify possible Chronic Kidney Disease.    Anion gap 13 5 - 15  CBC     Status: Abnormal   Collection Time: 06/21/17  9:01 AM  Result Value Ref  Range   WBC 10.2 4.0 - 10.5 K/uL   RBC 3.90 (L) 4.22 - 5.81 MIL/uL   Hemoglobin 12.1 (L) 13.0 - 17.0 g/dL   HCT 37.5 (L) 39.0 - 52.0 %   MCV 96.2 78.0 - 100.0 fL   MCH 31.0 26.0 - 34.0 pg   MCHC 32.3 30.0 - 36.0 g/dL   RDW 14.6 11.5 - 15.5 %   Platelets 139 (L) 150 - 400 K/uL  Basic metabolic panel     Status: Abnormal   Collection Time: 06/22/17  5:27 AM  Result Value Ref Range   Sodium 137 135 - 145 mmol/L   Potassium 3.4 (L) 3.5 - 5.1 mmol/L   Chloride 100 (L) 101 - 111 mmol/L   CO2 27 22 - 32 mmol/L   Glucose, Bld 123 (H) 65 - 99 mg/dL   BUN 19 6 - 20 mg/dL   Creatinine, Ser 1.02 0.61 - 1.24 mg/dL    Calcium 8.3 (L) 8.9 - 10.3 mg/dL   GFR calc non Af Amer >60 >60 mL/min   GFR calc Af Amer >60 >60 mL/min    Comment: (NOTE) The eGFR has been calculated using the CKD EPI equation. This calculation has not been validated in all clinical situations. eGFR's persistently <60 mL/min signify possible Chronic Kidney Disease.    Anion gap 10 5 - 15  CBC     Status: Abnormal   Collection Time: 06/22/17  5:27 AM  Result Value Ref Range   WBC 9.0 4.0 - 10.5 K/uL   RBC 4.03 (L) 4.22 - 5.81 MIL/uL   Hemoglobin 12.3 (L) 13.0 - 17.0 g/dL   HCT 39.0 39.0 - 52.0 %   MCV 96.8 78.0 - 100.0 fL   MCH 30.5 26.0 - 34.0 pg   MCHC 31.5 30.0 - 36.0 g/dL   RDW 14.5 11.5 - 15.5 %   Platelets 144 (L) 150 - 400 K/uL   Recent Results (from the past 240 hour(s))  MRSA PCR Screening     Status: None   Collection Time: 06/20/17  5:46 PM  Result Value Ref Range Status   MRSA by PCR NEGATIVE NEGATIVE Final    Comment:        The GeneXpert MRSA Assay (FDA approved for NASAL specimens only), is one component of a comprehensive MRSA colonization surveillance program. It is not intended to diagnose MRSA infection nor to guide or monitor treatment for MRSA infections.    Creatinine: Recent Labs    06/20/17 1033 06/21/17 0901 06/22/17 0527  CREATININE 1.07 1.57* 1.02   Baseline Creatinine: 1  Impression/Assessment:  84yo with BPH with LUTS, urinary retention, gross hematuria  Plan:  1. Gross hematuria: The hematuria is likely related to foley catheter placement and should resolve int he next 24-48 hours. His urine is light red and the foley is draining well. If the hematuria worsens please replace the foley with a 3 way and start CBI. At that time please contact urology  2. BPH with LUTS, urinary retention:  I discussed the treatment options for urinary retention with the patient, wife and son including medical and surgical options. After the discussion we had agreed to leave the foley in place until  the patient is able to ambulate. We will increase his flomax to BID. He should followup with [facility] 1 week after discharge for a voiding trial  Nicolette Bang 06/22/2017, 1:10 PM

## 2017-06-22 NOTE — Progress Notes (Signed)
I received a call from pt's son, [name], requesting information concerning a possible inpt rehab admit if possible for his Mom retired from [employer] 29 years ago. I explained that PT eval was pending and that recommendations would be made then. I will follow up after PT eval with son. [phone]

## 2017-06-23 DIAGNOSIS — R42 Dizziness and giddiness: Secondary | ICD-10-CM

## 2017-06-23 LAB — COMPREHENSIVE METABOLIC PANEL
ALBUMIN: 2.2 g/dL — AB (ref 3.5–5.0)
ALK PHOS: 59 U/L (ref 38–126)
ALT: 18 U/L (ref 17–63)
AST: 24 U/L (ref 15–41)
Anion gap: 10 (ref 5–15)
BUN: 17 mg/dL (ref 6–20)
CALCIUM: 8.4 mg/dL — AB (ref 8.9–10.3)
CHLORIDE: 99 mmol/L — AB (ref 101–111)
CO2: 27 mmol/L (ref 22–32)
CREATININE: 0.9 mg/dL (ref 0.61–1.24)
GFR calc non Af Amer: >60 mL/min (ref >60)
GLUCOSE: 113 mg/dL — AB (ref 65–99)
Potassium: 4.2 mmol/L (ref 3.5–5.1)
SODIUM: 136 mmol/L (ref 135–145)
Total Bilirubin: 1.3 mg/dL — ABNORMAL HIGH (ref 0.3–1.2)
Total Protein: 5.2 g/dL — ABNORMAL LOW (ref 6.5–8.1)

## 2017-06-23 LAB — CBC WITH DIFFERENTIAL/PLATELET
BASOS ABS: 0 10*3/uL (ref 0.0–0.1)
BASOS PCT: 0 %
EOS ABS: 0.1 10*3/uL (ref 0.0–0.7)
EOS PCT: 1 %
HCT: 38 % — ABNORMAL LOW (ref 39.0–52.0)
Hemoglobin: 11.9 g/dL — ABNORMAL LOW (ref 13.0–17.0)
Lymphocytes Relative: 9 %
Lymphs Abs: 0.7 10*3/uL (ref 0.7–4.0)
MCH: 30.8 pg (ref 26.0–34.0)
MCHC: 31.3 g/dL (ref 30.0–36.0)
MCV: 98.4 fL (ref 78.0–100.0)
MONO ABS: 1.2 10*3/uL — AB (ref 0.1–1.0)
Monocytes Relative: 15 %
NEUTROS ABS: 5.8 10*3/uL (ref 1.7–7.7)
Neutrophils Relative %: 75 %
PLATELETS: 153 10*3/uL (ref 150–400)
RBC: 3.86 MIL/uL — ABNORMAL LOW (ref 4.22–5.81)
RDW: 14.4 % (ref 11.5–15.5)
WBC: 7.7 10*3/uL (ref 4.0–10.5)

## 2017-06-23 LAB — PHOSPHORUS: PHOSPHORUS: 2.9 mg/dL (ref 2.5–4.6)

## 2017-06-23 LAB — MAGNESIUM: MAGNESIUM: 1.5 mg/dL — AB (ref 1.7–2.4)

## 2017-06-23 MED ORDER — MAGNESIUM SULFATE 2 GM/50ML IV SOLN
2.0000 g | Freq: Once | INTRAVENOUS | Status: AC
  Administered 2017-06-23: 2 g via INTRAVENOUS
  Filled 2017-06-23: qty 50

## 2017-06-23 MED ORDER — ADULT MULTIVITAMIN W/MINERALS CH
1.0000 | ORAL_TABLET | Freq: Every day | ORAL | Status: DC
  Administered 2017-06-24 – 2017-06-25 (×2): 1 via ORAL
  Filled 2017-06-23 (×2): qty 1

## 2017-06-23 MED ORDER — ENSURE ENLIVE PO LIQD
237.0000 mL | Freq: Two times a day (BID) | ORAL | Status: DC
  Administered 2017-06-23 – 2017-06-25 (×4): 237 mL via ORAL

## 2017-06-23 NOTE — Progress Notes (Signed)
Subjective:  He is feeling much more improved with regard to his strength today.  His wife and grandson present at the bedside.  Denies any chest pain or shortness of breath.  Patient had elevated heart rate when they tried to ambulate yesterday.  Objective:  Vital Signs in the last 24 hours: Temp:  [97.4 F (36.3 C)-98 F (36.7 C)] 97.8 F (36.6 C) (01/19 0523) Pulse Rate:  [75-92] 90 (01/19 0919) Resp:  [16-20] 16 (01/19 0523) BP: (105-134)/(63-92) 113/78 (01/19 0919) SpO2:  [95 %-98 %] 98 % (01/19 0523) Weight:  [76.4 kg (168 lb 6.9 oz)] 76.4 kg (168 lb 6.9 oz) (01/19 0523)  Intake/Output from previous day: 01/18 0701 - 01/19 0700 In: 840 [P.O.:840] Out: 475 [Urine:475]  Physical Exam: Blood pressure 113/78, pulse 90, temperature 97.8 F (36.6 C), temperature source Oral, resp. rate 16, height '5\' 9"'$  (1.753 m), weight 76.4 kg (168 lb 6.9 oz), SpO2 98 %.  General appearance: alert, cooperative, appears stated age and no distress Eyes: negative findings: lids and lashes normal Neck: extensive ecchymosis of the face and neck noted.  No JVD Neck: JVP - normal, carotids 2+= without bruits Resp: Clear breath sounds Chest wall: no tenderness Cardio: S1 variable, S2 normal, distant heart sounds, no murmur appreciated. GI: soft, non-tender; bowel sounds normal; no masses,  no organomegaly Extremities: extremities normal, atraumatic, no cyanosis or edema    Lab Results: BMP Recent Labs    06/21/17 0901 06/22/17 0527 06/23/17 0743  NA 135 137 136  K 3.7 3.4* 4.2  CL 98* 100* 99*  CO2 '24 27 27  '$ GLUCOSE 163* 123* 113*  BUN '18 19 17  '$ CREATININE 1.57* 1.02 0.90  CALCIUM 7.9* 8.3* 8.4*  GFRNONAA 37* >60 >60  GFRAA 43* >60 >60    CBC Recent Labs  Lab 06/23/17 0743  WBC 7.7  RBC 3.86*  HGB 11.9*  HCT 38.0*  PLT 153  MCV 98.4  MCH 30.8  MCHC 31.3  RDW 14.4  LYMPHSABS 0.7  MONOABS 1.2*  EOSABS 0.1  BASOSABS 0.0    HEMOGLOBIN A1C No results found for: HGBA1C,  MPG  Cardiac Panel (last 3 results) Recent Labs    06/20/17 1033 06/20/17 1233  CKTOTAL 90  --   TROPONINI  --  0.03*   Hepatic Function Panel Recent Labs    06/20/17 1033 06/23/17 0743  PROT 6.3* 5.2*  ALBUMIN 2.8* 2.2*  AST 16 24  ALT 15* 18  ALKPHOS 68 59  BILITOT 1.9* 1.3*   Cardiac Studies:  EKG 06/20/2017: Atrial fibrillation with rapid ventricle response at the rate of 124 bpm, normal axis. Poor R progression, probably normal variant, cannot exclude anteroseptal infarct old. Nonspecific T abnormality.  Scheduled Meds: . amiodarone  200 mg Oral TID  . atorvastatin  10 mg Oral Q2000  . diltiazem  30 mg Oral Q8H  . dutasteride  0.5 mg Oral Daily   And  . tamsulosin  0.4 mg Oral Daily  . metoprolol tartrate  50 mg Oral BID  . polyethylene glycol  17 g Oral Daily  . Rivaroxaban  15 mg Oral Q supper   Continuous Infusions: PRN Meds:.acetaminophen, docusate sodium, ondansetron (ZOFRAN) IV, traMADol  Assessment/Plan:  1.  Chronic Afib, presently on PO amiodarone and also beta blocker and CCB. Heart rate much improved and controlled on telemetry. CHA2DS2VAsc score 4. Stroke risk 4% 2.CAD s/p LAD PCI 04/2017: stenting of the proximal LAD 04/24/2017: CSI atherectomy followed by DES stenting with 3.0  x 26 mm resolute Onyx followed by postdilatation with a 3.5 x 15 mm noncompliant balloon at 16 atm of pressure. 4. Abdominal aortic aneurysm: CT of the abdomen 11/29/2016: Heterogeneous plaque, fusiform abdominal aortic aneurysm distal to the origin of IMA, 4.8 cm, enlarged compared to 4.4 cm from 04/26/2015. Circumferential adherent mural thrombus. 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 Mild stenosis with calcification left renal artery. High-grade stenosis at the origin of the right IIA, right external iliac artery high-grade stenosis. Severe calcification of the femoral arteries. Coronary calcification. Abdominal aortic duplex 08/23/2016:  Moderate dilatation of the abdominal aorta is noted in the mid and distal aorta measuring 4.11 x 4.18 x 4.44 cm is seen. Diffuse plaque noted in the proximal aorta. Focal plaque noted in the mid and distal aorta. Compared to the study done on 02/15/2016, no significant change.  5. Hypertension with stage III chronic kidney disease, serum creatinine stable and in fact improved today. 6. Tobacco abuse COPD patient still smokes 1 or 2 cigarettes at home. 7. Severe malnutrition 8. Failure to thrive in an adult  Recommendation: Patient had A. fib with RVR, hence added diltiazem back to his medical regimen.  On telemetry is A. fib is now rate controlled.  Continue amiodarone 200 mg p.o. 3 times daily.  No clinical evidence of congestive heart failure.  Needs continued physical therapy.  With regard to CAD, I have discontinued Plavix, I will continue Xarelto only to reduce the risk of bleeding.  Hemoglobin has remained stable.   Adrian Prows, M.D. 06/23/2017, 9:43 AM [facility] Pager: [phone] Office: [phone] If no answer: [phone]

## 2017-06-23 NOTE — Progress Notes (Signed)
Initial Nutrition Assessment  DOCUMENTATION CODES:   Non-severe (moderate) malnutrition in context of chronic illness  INTERVENTION:   Ensure Enlive po BID, each supplement provides 350 kcal and 20 grams of protein  MVI daily  Magic cup TID with meals, each supplement provides 290 kcal and 9 grams of protein  Liberalize diet   NUTRITION DIAGNOSIS:   Moderate Malnutrition related to catabolic illness(heart disease, COPD) as evidenced by moderate fat depletion, moderate muscle depletion.  GOAL:   Patient will meet greater than or equal to 90% of their needs  MONITOR:   PO intake, Supplement acceptance, Weight trends, Labs, I & O's, Skin  REASON FOR ASSESSMENT:   Consult Assessment of nutrition requirement/status  ASSESSMENT:   84 y.o. male with h/o HTN; HLD; CAD; COPD; and afib.  He was admitted by the Cardiology service for Atrial Fib with RVR after suffering a fall on 1/13 with nasal fracture and multiple ecchymoses.   Met with pt in room today. Pt is a poor historian but reports good appetite and oral intake pta. Pt is currently eating 75% of meals in hospital. Pt does not drink supplements at home. Per chart, pt with 8lbs(5%) wt loss in 2 months; this is not significant. RD will order supplements to help pt meet his estimated needs. RD will liberalize diet.   Medications reviewed and include: miralax, Mg sulfate  Labs reviewed: K 4.2 wnl, Cl 99(L), Ca 8.4(L) adj. 9.84 wnl, P 2.9 wnl, Mg 1.5(L), alb 2.2(L), tbili 1.3(H)  Nutrition-Focused physical exam completed. Findings are moderate fat depletions in arms, and chest, moderate muscle depletions over entire body, and no edema. Pt noted to have bruising on his face r/t fall.   Diet Order:  Diet Heart Room service appropriate? Yes; Fluid consistency: Thin  EDUCATION NEEDS:   Education needs have been addressed  Skin:  Reviewed RN Assessment  Last BM:  1/17  Height:   Ht Readings from Last 1 Encounters:   06/20/17 '5\' 9"'$  (1.753 m)    Weight:   Wt Readings from Last 1 Encounters:  06/23/17 168 lb 6.9 oz (76.4 kg)    Ideal Body Weight:  72.7 kg  BMI:  Body mass index is 24.87 kg/m.  Estimated Nutritional Needs:   Kcal:  1700-2000kcal/day   Protein:  99-107g/day   Fluid:  per MD  Koleen Distance MS, RD, LDN Pager #[phone] After Hours Pager: [phone]

## 2017-06-23 NOTE — Evaluation (Signed)
Occupational Therapy Evaluation Patient Details Name: Jeremy Macdonald MRN: [id number] DOB: [birth year] Today's Date: 06/23/2017    History of Present Illness Pt is an 84 y/o male admitted secondary to fatigue dizziness. Pt also with fall at home which resulted in nasal bone fracture. CT of the spine negative for acute abnormality. CT of the head showed diffuse atrophy with extensive small vessel disease. PMH includes Dementia, a fib, CAD s/p stent placement, CKD, COPD, HTN, AAA.    Clinical Impression   PTA, pt was living at home and reported independence with ADL and functional mobility to OT this session. However, note report to PT previous day that he had been utilizing RW for mobility and history of dementia. Pt was able to complete short-distance ambulation with RW for simulated toilet transfers with min assist this session. He additionally requires mod assist for LB ADL and min assist for seated UB ADL. Pt demonstrates significant decline from PLOF and is limited by decreased activity tolerance for ADL and neck pain this session. Recommend short-term SNF level rehabilitation prior to returning home with his wife in order to maximize functional independence. Pt with HR up to 110 this session and see below for further details concerning vitals. OT will continue to follow while admitted.     Follow Up Recommendations  SNF;Supervision/Assistance - 24 hour    Equipment Recommendations  Other (comment)(TBD at next venue of care)    Recommendations for Other Services       Precautions / Restrictions Precautions Precautions: Fall;Other (comment) Precaution Comments: Watch HR; had a fall PTA Restrictions Weight Bearing Restrictions: No      Mobility Bed Mobility Overal bed mobility: Needs Assistance Bed Mobility: Rolling;Sidelying to Sit Rolling: Min assist Sidelying to sit: Mod assist       General bed mobility comments: Mod assist to manage BLE.   Transfers Overall  transfer level: Needs assistance Equipment used: Rolling walker (2 wheeled) Transfers: Sit to/from Stand Sit to Stand: Min assist         General transfer comment: Min assist to power up into standing position. Cues for safe hand placement. HR up to 110 with mobility to chair.     Balance Overall balance assessment: Needs assistance Sitting-balance support: No upper extremity supported;Feet supported Sitting balance-Leahy Scale: Fair     Standing balance support: Bilateral upper extremity supported;During functional activity Standing balance-Leahy Scale: Poor Standing balance comment: Reliant on BUE support                            ADL either performed or assessed with clinical judgement   ADL Overall ADL's : Needs assistance/impaired Eating/Feeding: Supervision/ safety;Sitting   Grooming: Supervision/safety;Sitting   Upper Body Bathing: Minimal assistance;Sitting   Lower Body Bathing: Moderate assistance;Sit to/from stand   Upper Body Dressing : Minimal assistance;Sitting   Lower Body Dressing: Moderate assistance;Sit to/from stand   Toilet Transfer: Minimal assistance;Ambulation;RW Toilet Transfer Details (indicate cue type and reason): from elevated surface with cues for safe hand placement. Toileting- Water quality scientist and Hygiene: Sit to/from stand;Moderate assistance;Minimal assistance       Functional mobility during ADLs: Minimal assistance;Rolling walker General ADL Comments: Pt unsteady and with decreased activity tolerance for ADL. Limited by neck pain.      Vision Patient Visual Report: No change from baseline Additional Comments: Will continue to assess     Perception     Praxis      Pertinent Vitals/Pain Pain Assessment:  Faces Faces Pain Scale: Hurts little more Pain Location: neck  Pain Descriptors / Indicators: Grimacing;Guarding Pain Intervention(s): Limited activity within patient's tolerance;Monitored during  session;Repositioned     Hand Dominance     Extremity/Trunk Assessment Upper Extremity Assessment Upper Extremity Assessment: Generalized weakness   Lower Extremity Assessment Lower Extremity Assessment: Generalized weakness       Communication Communication Communication: No difficulties   Cognition Arousal/Alertness: Awake/alert Behavior During Therapy: WFL for tasks assessed/performed Overall Cognitive Status: History of cognitive impairments - at baseline                                 General Comments: History of dementia   General Comments  No dizziness noted this session. Pt with SpO2 97% on 3L O2 via [place] and removed briefly during transfer. Desaturation to 87% noted and returned 3L O2 with quick rebound to 95%. HR up to 110 from 80s with transfer.     Exercises     Shoulder Instructions      Home Living Family/patient expects to be discharged to:: Skilled nursing facility Living Arrangements: Spouse/significant other Available Help at Discharge: Family;Available 24 hours/day Type of Home: Apartment Home Access: Level entry     Home Layout: One level     Bathroom Shower/Tub: Occupational psychologist: Handicapped height     Home Equipment: Environmental consultant - standard          Prior Functioning/Environment Level of Independence: Independent with assistive device(s)        Comments: Per PT used standard walker for ambulation but reported to OT no assistive device        OT Problem List: Decreased range of motion;Impaired balance (sitting and/or standing);Decreased safety awareness;Decreased knowledge of use of DME or AE;Decreased knowledge of precautions;Decreased strength;Decreased activity tolerance;Pain      OT Treatment/Interventions: Self-care/ADL training;Therapeutic exercise;Energy conservation;DME and/or AE instruction;Therapeutic activities;Patient/family education;Balance training;Cognitive  remediation/compensation;Visual/perceptual remediation/compensation    OT Goals(Current goals can be found in the care plan section) Acute Rehab OT Goals Patient Stated Goal: to get up and moving  OT Goal Formulation: With patient Time For Goal Achievement: 07/07/17 Potential to Achieve Goals: Good ADL Goals Pt Will Perform Grooming: with supervision;standing Pt Will Perform Lower Body Dressing: with supervision;sit to/from stand Pt Will Transfer to Toilet: with supervision;ambulating;bedside commode(BSC over toilet) Pt Will Perform Toileting - Clothing Manipulation and hygiene: with supervision;sit to/from stand Additional ADL Goal #1: Pt will complete bed mobiltiy in preparation for ADL participation seated at EOB with overall supervision.  OT Frequency: Min 2X/week   Barriers to D/C:            Co-evaluation              AM-PAC PT "6 Clicks" Daily Activity     Outcome Measure Help from another person eating meals?: A Little Help from another person taking care of personal grooming?: A Little Help from another person toileting, which includes using toliet, bedpan, or urinal?: A Lot Help from another person bathing (including washing, rinsing, drying)?: A Little Help from another person to put on and taking off regular upper body clothing?: A Little Help from another person to put on and taking off regular lower body clothing?: A Lot 6 Click Score: 16   End of Session Equipment Utilized During Treatment: Rolling walker;Gait belt Nurse Communication: Mobility status(HR; sheets with blood from foley)  Activity Tolerance: Patient tolerated treatment well Patient  left: with call bell/phone within reach;in chair;with chair alarm set;with family/visitor present  OT Visit Diagnosis: Other abnormalities of gait and mobility (R26.89);Muscle weakness (generalized) (M62.81);Pain Pain - Right/Left: (neck) Pain - part of body: (neck)                Time: 4643-1427 OT Time  Calculation (min): 27 min Charges:  OT General Charges $OT Visit: 1 Visit OT Evaluation $OT Eval Moderate Complexity: 1 Mod OT Treatments $Self Care/Home Management : 8-22 mins G-Codes:     Norman Herrlich, MS OTR/L  Pager: Doctor Phillips A Shanise Balch 06/23/2017, 2:04 PM

## 2017-06-23 NOTE — Plan of Care (Signed)
Pt verbalizes weakness. Able to transfer from bed to chair with two assist

## 2017-06-23 NOTE — Progress Notes (Signed)
PROGRESS NOTE    Jeremy Macdonald  ZOX:[id number] DOB: 1926/03/22 DOA: 06/20/2017 PCP: Vernie Shanks, MD   Brief Narrative:  Jeremy Macdonald is an 84 y.o. male with h/o HTN; HLD; CAD; COPD; and afib.  He was admitted by the Cardiology service yesterday for Atrial Fib with RVR after suffering a fall on 1/13 with nasal fracture and multiple ecchymoses.  TRH was consulted regarding dizziness and generalized weakness.    He has had trouble walking.  This time, he was outside and he tripped over a curb and hit his head.  He was confused and so not a great historian but reportedthat he felt dizzy but none today.  He has been a bit more dizzy in the last 6 months.  His wife notes that this dizziness has been ongoing since he had the stent placed but she doesn't think that is related to his dizziness.  5ya he developed urinary retention; Infrequent incontinence.  They feel like the [place] Gauss has worked very well for his BPH.  He has had some dizziness while in the hospital. Dizziness is only when he is up. The dizziness has improved with rate control.  He did have urinary retention last night requiring a foley.  The urinary retention resulted in obstructive uropathy and acute renal failure; he is now again making urine albeit blood-tinged after traumatic foley insertion.  Urology has also been asked to consult on the patient.      We were asked to take over for Cardiology as primary and will do so as he has other medical issues and for Placement. Cardiology will continue to follow the patient and appreciate their help in managing patient's HR. Patient weak and will need SNF.   Assessment & Plan:   Principal Problem:   Atrial fibrillation with rapid ventricular response (HCC) Active Problems:   Dementia   Hyperglycemia   BPH (benign prostatic hyperplasia)   Dizziness  Atiral Fibrillation with RVR, improving -This issue is being cared for by the Cardiology team -He appears to still be on both  Amiodarone and Cardizem; C/w Diltiazem 30 mg q8h and Amiodarone 200 mg po TID -He has been taking Xarelto as an outpatient and is being continued  -With his recurrent falls and dizziness, would suggest consideration of risk:benefit from Eliquis and consider discontinuation of AC at some point in the near future -Cardiology D/C'd Plavix to reduce bleeding risk   Dizziness/Generalized Weakeness -With ataxia, dementia, and some incontinence, NPH was entertained - particularly since this may be a way to improve the dementia -Patient discussed with Dr. Cheral Marker and CT reviewed.  CT does not appear to be consistent with NPH and instead appears to show diffuse atrophy.   -Postural hypotension and dizziness are both commonly associated with dementia. -Will order Orthostatic BPs. -PT/OT Recommending SNF -Will Consult Social Work for Placement   Dementia -By exam, the patient does appear to have mild to moderate dementia which is likely to benefit from further outpatient consideration.   -Suspect that he would benefit from medication, although his wife is upset by this possible diagnosis since her sister also had dementia -Will need ongoing support and close outpatient f/u with Neurology -May consider getting MRI but will hold off for now  BPH -Patient with known BPH -Has had good response to [place] an apparent obstructive uropathy yesterday necessitating foley placement -Acute kidney injury developed as a result but improved -Foley is draining and has some blood tinged; asked Urology to re-evaluate and  recommending no Intervention currently  -C/w Avodart and Flomax -Would expect resolution of AKI with obstruction resolution -C/w Foley and follow up with Dr. Alyson Ingles as an outpatient   Hyperglycemia -May be stress response -Will follow with fasting AM labs -It is unlikely that he will need acute or chronic treatment for this issue  Hypokalemia -Replete and improved -Repeat CMP  in AM   CAD s/p PCI -Cardiology discontinued Plavix -C/w Atorvastatin and Metoprolol   Hx of COPD -Currently not exacerbation  Moderate Malnutrition in the Context of Chronic illness -Nutritionist consulted -C/w Ensure Enlive po BID, MVI, Magic Cup TID, and Liberalizing diet  Hypomagnesemia -Patient's Mag Level was 1.5 -Replete with IV Mag Sulfate 2 grams -Continue to Monitor and Replete as Necessary -Repeat Mag in AM  DVT prophylaxis: Anticoagulated with Xarelto Code Status: FULL CODE Family Communication: Discussed with wife at bedside  Disposition Plan: Anticipate SNF  Consultants:   Cardiology  Urology   Procedures:  None   Antimicrobials:  Anti-infectives (From admission, onward)   None     Subjective: Seen and examined and felt better but still feeling weak and wife states she can't take care of him in this condition.   Objective: Vitals:   06/23/17 0523 06/23/17 0656 06/23/17 0919 06/23/17 1320  BP: 124/79 (!) 134/92 113/78 115/76  Pulse: 92  90 92  Resp: 16   15  Temp: 97.8 F (36.6 C)   98 F (36.7 C)  TempSrc: Oral   Oral  SpO2: 98%     Weight: 76.4 kg (168 lb 6.9 oz)     Height:        Intake/Output Summary (Last 24 hours) at 06/23/2017 2012 Last data filed at 06/23/2017 1700 Gross per 24 hour  Intake 720 ml  Output 300 ml  Net 420 ml   Filed Weights   06/21/17 0629 06/22/17 0425 06/23/17 0523  Weight: 75.8 kg (167 lb 1.7 oz) 80.9 kg (178 lb 6.4 oz) 76.4 kg (168 lb 6.9 oz)   Examination: Physical Exam:  Constitutional: Elderly Caucasian Male in NAD and appears calm Eyes: Has bruising around eyes. Sclerae anicteric.  ENMT: Has bruising on face. Slightly hard of hearing.  Neck: Supple with significant bruising around neck Respiratory: Diminished to auscultation Bilaterally. No appreciable wheezing/rales/rhonchi Cardiovascular: Irregularly Irregular; Mild LE edema Abdomen: Soft, NT, ND. Bowel sounds present  GU: Foley catheter in  place with blood around meatus. Has Dark blood in Foley bag Musculoskeletal: No contractures; No cyanosis Skin: Warm and Dry; Has significant facial and neck bruising.  Neurologic: CN 2-12 grossly intact. No appreciable focal deficits Psychiatric: Normal judgement and insight. Awake and alert  Data Reviewed: I have personally reviewed following labs and imaging studies  CBC: Recent Labs  Lab 06/20/17 1033 06/21/17 0901 06/22/17 0527 06/23/17 0743  WBC 11.2* 10.2 9.0 7.7  NEUTROABS 9.4*  --   --  5.8  HGB 13.6 12.1* 12.3* 11.9*  HCT 42.8 37.5* 39.0 38.0*  MCV 97.3 96.2 96.8 98.4  PLT 149* 139* 144* 161   Basic Metabolic Panel: Recent Labs  Lab 06/20/17 1033 06/21/17 0901 06/22/17 0527 06/23/17 0743  NA 135 135 137 136  K 4.0 3.7 3.4* 4.2  CL 99* 98* 100* 99*  CO2 23 24 27 27   GLUCOSE 116* 163* 123* 113*  BUN 13 18 19 17   CREATININE 1.07 1.57* 1.02 0.90  CALCIUM 8.7* 7.9* 8.3* 8.4*  MG  --   --   --  1.5*  PHOS  --   --   --  2.9   GFR: Estimated Creatinine Clearance: 55.6 mL/min (by C-G formula based on SCr of 0.9 mg/dL). Liver Function Tests: Recent Labs  Lab 06/20/17 1033 06/23/17 0743  AST 16 24  ALT 15* 18  ALKPHOS 68 59  BILITOT 1.9* 1.3*  PROT 6.3* 5.2*  ALBUMIN 2.8* 2.2*   No results for input(s): LIPASE, AMYLASE in the last 168 hours. No results for input(s): AMMONIA in the last 168 hours. Coagulation Profile: No results for input(s): INR, PROTIME in the last 168 hours. Cardiac Enzymes: Recent Labs  Lab 06/20/17 1033 06/20/17 1233  CKTOTAL 90  --   TROPONINI  --  0.03*   BNP (last 3 results) No results for input(s): PROBNP in the last 8760 hours. HbA1C: No results for input(s): HGBA1C in the last 72 hours. CBG: No results for input(s): GLUCAP in the last 168 hours. Lipid Profile: No results for input(s): CHOL, HDL, LDLCALC, TRIG, CHOLHDL, LDLDIRECT in the last 72 hours. Thyroid Function Tests: No results for input(s): TSH, T4TOTAL,  FREET4, T3FREE, THYROIDAB in the last 72 hours. Anemia Panel: No results for input(s): VITAMINB12, FOLATE, FERRITIN, TIBC, IRON, RETICCTPCT in the last 72 hours. Sepsis Labs: No results for input(s): PROCALCITON, LATICACIDVEN in the last 168 hours.  Recent Results (from the past 240 hour(s))  MRSA PCR Screening     Status: None   Collection Time: 06/20/17  5:46 PM  Result Value Ref Range Status   MRSA by PCR NEGATIVE NEGATIVE Final    Comment:        The GeneXpert MRSA Assay (FDA approved for NASAL specimens only), is one component of a comprehensive MRSA colonization surveillance program. It is not intended to diagnose MRSA infection nor to guide or monitor treatment for MRSA infections.     Radiology Studies: No results found. Scheduled Meds: . amiodarone  200 mg Oral TID  . atorvastatin  10 mg Oral Q2000  . diltiazem  30 mg Oral Q8H  . dutasteride  0.5 mg Oral Daily   And  . tamsulosin  0.4 mg Oral Daily  . feeding supplement (ENSURE ENLIVE)  237 mL Oral BID BM  . metoprolol tartrate  50 mg Oral BID  . multivitamin with minerals  1 tablet Oral Daily  . polyethylene glycol  17 g Oral Daily  . Rivaroxaban  15 mg Oral Q supper   Continuous Infusions:   LOS: 3 days   Kerney Elbe, DO [facility] Pager [phone]  If 7PM-7AM, please contact night-coverage [email or web address] Password [email or web address] 06/23/2017, 8:12 PM

## 2017-06-23 NOTE — Progress Notes (Signed)
  Subjective: Patient reports no complaints. Lucid and talkative.   Objective: Vital signs in last 24 hours: Temp:  [97.4 F (36.3 C)-98 F (36.7 C)] 98 F (36.7 C) (01/19 1320) Pulse Rate:  [75-92] 92 (01/19 1320) Resp:  [15-20] 15 (01/19 1320) BP: (105-134)/(63-92) 115/76 (01/19 1320) SpO2:  [98 %] 98 % (01/19 0523) Weight:  [76.4 kg (168 lb 6.9 oz)] 76.4 kg (168 lb 6.9 oz) (01/19 0523)  Intake/Output from previous day: 01/18 0701 - 01/19 0700 In: 840 [P.O.:840] Out: 475 [Urine:475] Intake/Output this shift: Total I/O In: 480 [P.O.:480] Out: 225 [Urine:225]  Physical Exam:  NAD Foley in place - urine dark in bag, clear in tubing.   Lab Results: Recent Labs    06/21/17 0901 06/22/17 0527 06/23/17 0743  HGB 12.1* 12.3* 11.9*  HCT 37.5* 39.0 38.0*   BMET Recent Labs    06/22/17 0527 06/23/17 0743  NA 137 136  K 3.4* 4.2  CL 100* 99*  CO2 27 27  GLUCOSE 123* 113*  BUN 19 17  CREATININE 1.02 0.90  CALCIUM 8.3* 8.4*   No results for input(s): LABPT, INR in the last 72 hours. No results for input(s): LABURIN in the last 72 hours. Results for orders placed or performed during the hospital encounter of 06/20/17  MRSA PCR Screening     Status: None   Collection Time: 06/20/17  5:46 PM  Result Value Ref Range Status   MRSA by PCR NEGATIVE NEGATIVE Final    Comment:        The GeneXpert MRSA Assay (FDA approved for NASAL specimens only), is one component of a comprehensive MRSA colonization surveillance program. It is not intended to diagnose MRSA infection nor to guide or monitor treatment for MRSA infections.     Studies/Results: No results found.  Assessment/Plan: 1. Gross hematuria: improving. Renal US 1/17 and CT last summer benign. I reviewed the images. No intervention needed. Continue foley.   2. BPH with LUTS, urinary retention: continue tamsulosin BID and finasteride. Prostate ~ 130 g on imaging.   F/u with Dr. Alyson Ingles as planned.       LOS: 3 days   Festus Aloe 06/23/2017, 3:41 PM

## 2017-06-24 DIAGNOSIS — E440 Moderate protein-calorie malnutrition: Secondary | ICD-10-CM

## 2017-06-24 LAB — PHOSPHORUS: Phosphorus: 3 mg/dL (ref 2.5–4.6)

## 2017-06-24 LAB — CBC WITH DIFFERENTIAL/PLATELET
BASOS ABS: 0 10*3/uL (ref 0.0–0.1)
BASOS PCT: 0 %
Eosinophils Absolute: 0.1 10*3/uL (ref 0.0–0.7)
Eosinophils Relative: 1 %
HEMATOCRIT: 39.5 % (ref 39.0–52.0)
HEMOGLOBIN: 12.3 g/dL — AB (ref 13.0–17.0)
Lymphocytes Relative: 7 %
Lymphs Abs: 0.7 10*3/uL (ref 0.7–4.0)
MCH: 30.4 pg (ref 26.0–34.0)
MCHC: 31.1 g/dL (ref 30.0–36.0)
MCV: 97.8 fL (ref 78.0–100.0)
Monocytes Absolute: 1.4 10*3/uL — ABNORMAL HIGH (ref 0.1–1.0)
Monocytes Relative: 15 %
NEUTROS ABS: 7.2 10*3/uL (ref 1.7–7.7)
NEUTROS PCT: 77 %
Platelets: 162 10*3/uL (ref 150–400)
RBC: 4.04 MIL/uL — ABNORMAL LOW (ref 4.22–5.81)
RDW: 14.4 % (ref 11.5–15.5)
WBC: 9.4 10*3/uL (ref 4.0–10.5)

## 2017-06-24 LAB — COMPREHENSIVE METABOLIC PANEL
ALK PHOS: 70 U/L (ref 38–126)
ALT: 20 U/L (ref 17–63)
AST: 24 U/L (ref 15–41)
Albumin: 2.3 g/dL — ABNORMAL LOW (ref 3.5–5.0)
Anion gap: 12 (ref 5–15)
BILIRUBIN TOTAL: 1.7 mg/dL — AB (ref 0.3–1.2)
BUN: 16 mg/dL (ref 6–20)
CALCIUM: 8.5 mg/dL — AB (ref 8.9–10.3)
CO2: 26 mmol/L (ref 22–32)
Chloride: 98 mmol/L — ABNORMAL LOW (ref 101–111)
Creatinine, Ser: 0.89 mg/dL (ref 0.61–1.24)
GFR calc Af Amer: >60 mL/min (ref >60)
GFR calc non Af Amer: >60 mL/min (ref >60)
GLUCOSE: 121 mg/dL — AB (ref 65–99)
POTASSIUM: 4 mmol/L (ref 3.5–5.1)
Sodium: 136 mmol/L (ref 135–145)
TOTAL PROTEIN: 5.5 g/dL — AB (ref 6.5–8.1)

## 2017-06-24 LAB — MAGNESIUM: Magnesium: 1.8 mg/dL (ref 1.7–2.4)

## 2017-06-24 MED ORDER — ALPRAZOLAM 0.25 MG PO TABS
0.2500 mg | ORAL_TABLET | Freq: Once | ORAL | Status: AC
  Administered 2017-06-24: 0.25 mg via ORAL
  Filled 2017-06-24: qty 1

## 2017-06-24 NOTE — Progress Notes (Signed)
PROGRESS NOTE    Jeremy Macdonald  ACZ:[id number] DOB: 1925-08-08 DOA: 06/20/2017 PCP: Vernie Shanks, MD   Brief Narrative:  Jeremy Macdonald is an 84 y.o. male with h/o HTN; HLD; CAD; COPD; and afib.  He was admitted by the Cardiology service yesterday for Atrial Fib with RVR after suffering a fall on 1/13 with nasal fracture and multiple ecchymoses.  TRH was consulted regarding dizziness and generalized weakness.    He has had trouble walking.  This time, he was outside and he tripped over a curb and hit his head.  He was confused and so not a great historian but reportedthat he felt dizzy but none today.  He has been a bit more dizzy in the last 6 months.  His wife notes that this dizziness has been ongoing since he had the stent placed but she doesn't think that is related to his dizziness.  5ya he developed urinary retention; Infrequent incontinence.  They feel like the [facility] Gauss has worked very well for his BPH.  He has had some dizziness while in the hospital. Dizziness is only when he is up. The dizziness has improved with rate control.  He did have urinary retention last night requiring a foley.  The urinary retention resulted in obstructive uropathy and acute renal failure; he is now again making urine albeit blood-tinged after traumatic foley insertion.  Urology has also been asked to consult on the patient.      We have been asked to take over for Cardiology as primary and will do so as he has other medical issues and for Placement. Cardiology will continue to follow the patient and appreciate their help in managing patient's HR. Patient has improved and now is just weak. Will discuss with PT about possible CIR evaluation given family's concern about ambulation.   Assessment & Plan:   Principal Problem:   Atrial fibrillation with rapid ventricular response (HCC) Active Problems:   Dementia   Hyperglycemia   BPH (benign prostatic hyperplasia)   Dizziness   Malnutrition of  moderate degree  Atiral Fibrillation with RVR, improving  -This issue is being cared for by the Cardiology team -He appears to still be on both Amiodarone and Cardizem; Was given more Diltiazem 30 mg q8h and Amiodarone today (200 mg po TID) -He has been taking Xarelto as an outpatient -With his recurrent falls and dizziness, would suggest consideration of risk:benefit from Xarelto and consider discontinuation of AC at some point in the near future but for now will keep on Xarelto -Cardiology D/C'd Plavix to reduce bleeding risk   Dizziness/Generalized Weakeness -With ataxia, dementia, and some incontinence, NPH was entertained - particularly since this may be a way to improve the dementia -Patient discussed with Dr. Cheral Marker and CT reviewed.  CT does not appear to be consistent with NPH and instead appears to show diffuse atrophy.   -Postural hypotension and dizziness are both commonly associated with dementia. -Will reorder Orthostatic BP's; Will also consider TED Hose -PT/OT Recommending SNF; Will have them re-evaluate for CIR -Will Consult Social Work for Placement   Dementia -By exam, the patient does appear to have mild to moderate dementia which is likely to benefit from further outpatient consideration.   -Suspect that he would benefit from medication, although his wife is upset by this possible diagnosis since her sister also had dementia -Will need ongoing support and close outpatient f/u with Neurology -May consider getting MRI as an outpatient   BPH -Patient with known BPH -  Has had good response to [employer] -Developed an apparent obstructive uropathy necessitating foley placement -Acute kidney injury developed as a result but improved -Foley is draining and has some blood tinged -C/w Avodart and Flomax -Would expect resolution of AKI with obstruction resolution -Urology saw recommending continuing Foley and if worse blood then get CBI -Will attempt TOV as an outpatient and  will need to see Dr. Alyson Ingles as an outpatient    Gross Hematuria -See above -Per Urology c/w Foley -Slightly improving  Hyperglycemia -May be stress response -Will follow with fasting AM labs -It is unlikely that he will need acute or chronic treatment for this issue  Hypokalemia -Repleted and improved to 4.0 -Repeat CMP in AM   CAD s/p PCI -Cardiology discontinued Plavix -C/w Atorvastatin and Metoprolol   Hx of COPD -Currently not exacerbation  Hypomagnesemia -Mag Level was 1.5 yesterday and improved to 1.8 -Replete with IV Mag Sulfate 2 grams yesterday -Continue to Monitor and Replete as Necessary -Repeat Mag Level in AM  DVT prophylaxis: Anticoagulated with Xarelto Code Status: FULL CODE Family Communication: Discussed with Neighbor at bedside Disposition Plan: Anticipate SNF; Will have PT re-evaluate for CIR  Consultants:   Cardiology  Urology   Procedures:  None   Antimicrobials:  Anti-infectives (From admission, onward)   None     Subjective: Seen and examined and felt better but still felt weak. No CP or SOB. No Nausea or vomiting. Wife states there is less blood around his penis today.   Objective: Vitals:   06/24/17 0500 06/24/17 0951 06/24/17 1322 06/24/17 1406  BP: 120/82 120/78 109/68 (!) 101/57  Pulse: (!) 107 (!) 101 94   Resp: 14  16   Temp: 97.7 F (36.5 C)  97.9 F (36.6 C)   TempSrc: Axillary  Oral   SpO2: 100%  99%   Weight: 79.7 kg (175 lb 11.2 oz)     Height:        Intake/Output Summary (Last 24 hours) at 06/24/2017 1416 Last data filed at 06/24/2017 1300 Gross per 24 hour  Intake 1200 ml  Output 800 ml  Net 400 ml   Filed Weights   06/22/17 0425 06/23/17 0523 06/24/17 0500  Weight: 80.9 kg (178 lb 6.4 oz) 76.4 kg (168 lb 6.9 oz) 79.7 kg (175 lb 11.2 oz)   Examination: Physical Exam:  Constitutional: Elderly Caucasian Male in NAD who appears calm but globally weak Eyes: Sclerae anicteric. Has bruising around  both eyes but worse on left ENMT: Has facial bruising predominantly on left side; Slightly hard of hearing. MMM Neck: Supple with significant bruising around neck. No JVD Respiratory:  Diminished to auscultation bilaterally; No appreciable wheezing/rales/rhonchi Cardiovascular: Irregularly Irregular and slightly tachycardic. Mild LE edema Abdomen: Soft, NT, ND. Bowel sounds present GU: Foley catheter in place with some dark blood tinged urine Musculoskeletal: No contractures; No cyanosis Skin: Has significant facial and neck bruising. Warm and Dry Neurologic: CN 2-12 grossly intact. No appreciable focal deficits Psychiatric: Normal mood and affect. Intact judgement and insight  Data Reviewed: I have personally reviewed following labs and imaging studies  CBC: Recent Labs  Lab 06/20/17 1033 06/21/17 0901 06/22/17 0527 06/23/17 0743 06/24/17 0623  WBC 11.2* 10.2 9.0 7.7 9.4  NEUTROABS 9.4*  --   --  5.8 7.2  HGB 13.6 12.1* 12.3* 11.9* 12.3*  HCT 42.8 37.5* 39.0 38.0* 39.5  MCV 97.3 96.2 96.8 98.4 97.8  PLT 149* 139* 144* 153 616   Basic Metabolic Panel: Recent Labs  Lab 06/20/17 1033 06/21/17 0901 06/22/17 0527 06/23/17 0743 06/24/17 0623  NA 135 135 137 136 136  K 4.0 3.7 3.4* 4.2 4.0  CL 99* 98* 100* 99* 98*  CO2 23 24 27 27 26   GLUCOSE 116* 163* 123* 113* 121*  BUN 13 18 19 17 16   CREATININE 1.07 1.57* 1.02 0.90 0.89  CALCIUM 8.7* 7.9* 8.3* 8.4* 8.5*  MG  --   --   --  1.5* 1.8  PHOS  --   --   --  2.9 3.0   GFR: Estimated Creatinine Clearance: 56.3 mL/min (by C-G formula based on SCr of 0.89 mg/dL). Liver Function Tests: Recent Labs  Lab 06/20/17 1033 06/23/17 0743 06/24/17 0623  AST 16 24 24   ALT 15* 18 20  ALKPHOS 68 59 70  BILITOT 1.9* 1.3* 1.7*  PROT 6.3* 5.2* 5.5*  ALBUMIN 2.8* 2.2* 2.3*   No results for input(s): LIPASE, AMYLASE in the last 168 hours. No results for input(s): AMMONIA in the last 168 hours. Coagulation Profile: No results for  input(s): INR, PROTIME in the last 168 hours. Cardiac Enzymes: Recent Labs  Lab 06/20/17 1033 06/20/17 1233  CKTOTAL 90  --   TROPONINI  --  0.03*   BNP (last 3 results) No results for input(s): PROBNP in the last 8760 hours. HbA1C: No results for input(s): HGBA1C in the last 72 hours. CBG: No results for input(s): GLUCAP in the last 168 hours. Lipid Profile: No results for input(s): CHOL, HDL, LDLCALC, TRIG, CHOLHDL, LDLDIRECT in the last 72 hours. Thyroid Function Tests: No results for input(s): TSH, T4TOTAL, FREET4, T3FREE, THYROIDAB in the last 72 hours. Anemia Panel: No results for input(s): VITAMINB12, FOLATE, FERRITIN, TIBC, IRON, RETICCTPCT in the last 72 hours. Sepsis Labs: No results for input(s): PROCALCITON, LATICACIDVEN in the last 168 hours.  Recent Results (from the past 240 hour(s))  MRSA PCR Screening     Status: None   Collection Time: 06/20/17  5:46 PM  Result Value Ref Range Status   MRSA by PCR NEGATIVE NEGATIVE Final    Comment:        The GeneXpert MRSA Assay (FDA approved for NASAL specimens only), is one component of a comprehensive MRSA colonization surveillance program. It is not intended to diagnose MRSA infection nor to guide or monitor treatment for MRSA infections.     Radiology Studies: No results found. Scheduled Meds: . amiodarone  200 mg Oral TID  . atorvastatin  10 mg Oral Q2000  . diltiazem  30 mg Oral Q8H  . dutasteride  0.5 mg Oral Daily   And  . tamsulosin  0.4 mg Oral Daily  . feeding supplement (ENSURE ENLIVE)  237 mL Oral BID BM  . metoprolol tartrate  50 mg Oral BID  . multivitamin with minerals  1 tablet Oral Daily  . polyethylene glycol  17 g Oral Daily  . Rivaroxaban  15 mg Oral Q supper   Continuous Infusions:   LOS: 4 days   Kerney Elbe, DO [facility] Pager [phone]  If 7PM-7AM, please contact night-coverage [email or web address] Password [email or web address] 06/24/2017, 2:16 PM

## 2017-06-25 ENCOUNTER — Other Ambulatory Visit: Payer: Self-pay

## 2017-06-25 ENCOUNTER — Encounter (HOSPITAL_COMMUNITY): Payer: Self-pay | Admitting: *Deleted

## 2017-06-25 ENCOUNTER — Inpatient Hospital Stay (HOSPITAL_COMMUNITY)
Admission: AD | Admit: 2017-06-25 | Discharge: 2017-07-10 | DRG: 945 | Disposition: A | Discharge status: Home Health | Payer: Medicare Other | Source: Intra-hospital | Attending: Physical Medicine & Rehabilitation | Admitting: Physical Medicine & Rehabilitation

## 2017-06-25 ENCOUNTER — Inpatient Hospital Stay (HOSPITAL_COMMUNITY): Payer: Medicare Other

## 2017-06-25 DIAGNOSIS — R319 Hematuria, unspecified: Secondary | ICD-10-CM | POA: Diagnosis present

## 2017-06-25 DIAGNOSIS — M170 Bilateral primary osteoarthritis of knee: Secondary | ICD-10-CM | POA: Diagnosis present

## 2017-06-25 DIAGNOSIS — Z888 Allergy status to other drugs, medicaments and biological substances status: Secondary | ICD-10-CM | POA: Diagnosis not present

## 2017-06-25 DIAGNOSIS — I2510 Atherosclerotic heart disease of native coronary artery without angina pectoris: Secondary | ICD-10-CM | POA: Diagnosis present

## 2017-06-25 DIAGNOSIS — Z7902 Long term (current) use of antithrombotics/antiplatelets: Secondary | ICD-10-CM

## 2017-06-25 DIAGNOSIS — Z8249 Family history of ischemic heart disease and other diseases of the circulatory system: Secondary | ICD-10-CM

## 2017-06-25 DIAGNOSIS — F0390 Unspecified dementia without behavioral disturbance: Secondary | ICD-10-CM | POA: Diagnosis present

## 2017-06-25 DIAGNOSIS — R339 Retention of urine, unspecified: Secondary | ICD-10-CM | POA: Diagnosis not present

## 2017-06-25 DIAGNOSIS — N400 Enlarged prostate without lower urinary tract symptoms: Secondary | ICD-10-CM | POA: Diagnosis not present

## 2017-06-25 DIAGNOSIS — R310 Gross hematuria: Secondary | ICD-10-CM

## 2017-06-25 DIAGNOSIS — R42 Dizziness and giddiness: Secondary | ICD-10-CM | POA: Diagnosis present

## 2017-06-25 DIAGNOSIS — Z85828 Personal history of other malignant neoplasm of skin: Secondary | ICD-10-CM | POA: Diagnosis not present

## 2017-06-25 DIAGNOSIS — I10 Essential (primary) hypertension: Secondary | ICD-10-CM | POA: Diagnosis present

## 2017-06-25 DIAGNOSIS — K7200 Acute and subacute hepatic failure without coma: Secondary | ICD-10-CM | POA: Diagnosis present

## 2017-06-25 DIAGNOSIS — H9193 Unspecified hearing loss, bilateral: Secondary | ICD-10-CM | POA: Diagnosis present

## 2017-06-25 DIAGNOSIS — Z9842 Cataract extraction status, left eye: Secondary | ICD-10-CM | POA: Diagnosis not present

## 2017-06-25 DIAGNOSIS — N365 Urethral false passage: Secondary | ICD-10-CM | POA: Diagnosis not present

## 2017-06-25 DIAGNOSIS — D62 Acute posthemorrhagic anemia: Secondary | ICD-10-CM | POA: Diagnosis present

## 2017-06-25 DIAGNOSIS — J449 Chronic obstructive pulmonary disease, unspecified: Secondary | ICD-10-CM | POA: Diagnosis present

## 2017-06-25 DIAGNOSIS — R627 Adult failure to thrive: Secondary | ICD-10-CM | POA: Diagnosis not present

## 2017-06-25 DIAGNOSIS — R338 Other retention of urine: Secondary | ICD-10-CM | POA: Diagnosis present

## 2017-06-25 DIAGNOSIS — N401 Enlarged prostate with lower urinary tract symptoms: Secondary | ICD-10-CM | POA: Diagnosis not present

## 2017-06-25 DIAGNOSIS — I4891 Unspecified atrial fibrillation: Secondary | ICD-10-CM | POA: Diagnosis present

## 2017-06-25 DIAGNOSIS — I4892 Unspecified atrial flutter: Secondary | ICD-10-CM | POA: Diagnosis present

## 2017-06-25 DIAGNOSIS — F17210 Nicotine dependence, cigarettes, uncomplicated: Secondary | ICD-10-CM | POA: Diagnosis present

## 2017-06-25 DIAGNOSIS — I25110 Atherosclerotic heart disease of native coronary artery with unstable angina pectoris: Secondary | ICD-10-CM | POA: Diagnosis not present

## 2017-06-25 DIAGNOSIS — I482 Chronic atrial fibrillation: Secondary | ICD-10-CM | POA: Diagnosis not present

## 2017-06-25 DIAGNOSIS — E7800 Pure hypercholesterolemia, unspecified: Secondary | ICD-10-CM | POA: Diagnosis present

## 2017-06-25 DIAGNOSIS — Z955 Presence of coronary angioplasty implant and graft: Secondary | ICD-10-CM | POA: Diagnosis not present

## 2017-06-25 DIAGNOSIS — F05 Delirium due to known physiological condition: Secondary | ICD-10-CM | POA: Diagnosis present

## 2017-06-25 DIAGNOSIS — H9190 Unspecified hearing loss, unspecified ear: Secondary | ICD-10-CM | POA: Diagnosis present

## 2017-06-25 DIAGNOSIS — R5381 Other malaise: Secondary | ICD-10-CM | POA: Diagnosis present

## 2017-06-25 DIAGNOSIS — Z79899 Other long term (current) drug therapy: Secondary | ICD-10-CM

## 2017-06-25 DIAGNOSIS — E440 Moderate protein-calorie malnutrition: Secondary | ICD-10-CM | POA: Diagnosis not present

## 2017-06-25 DIAGNOSIS — I481 Persistent atrial fibrillation: Secondary | ICD-10-CM | POA: Diagnosis not present

## 2017-06-25 DIAGNOSIS — Z9841 Cataract extraction status, right eye: Secondary | ICD-10-CM

## 2017-06-25 DIAGNOSIS — R296 Repeated falls: Secondary | ICD-10-CM | POA: Diagnosis present

## 2017-06-25 DIAGNOSIS — M11261 Other chondrocalcinosis, right knee: Secondary | ICD-10-CM | POA: Diagnosis present

## 2017-06-25 DIAGNOSIS — R52 Pain, unspecified: Secondary | ICD-10-CM | POA: Diagnosis not present

## 2017-06-25 DIAGNOSIS — E8809 Other disorders of plasma-protein metabolism, not elsewhere classified: Secondary | ICD-10-CM

## 2017-06-25 DIAGNOSIS — Z466 Encounter for fitting and adjustment of urinary device: Secondary | ICD-10-CM | POA: Diagnosis not present

## 2017-06-25 DIAGNOSIS — S069X1S Unspecified intracranial injury with loss of consciousness of 30 minutes or less, sequela: Secondary | ICD-10-CM | POA: Diagnosis not present

## 2017-06-25 DIAGNOSIS — Z87820 Personal history of traumatic brain injury: Secondary | ICD-10-CM | POA: Diagnosis not present

## 2017-06-25 DIAGNOSIS — G4701 Insomnia due to medical condition: Secondary | ICD-10-CM | POA: Diagnosis not present

## 2017-06-25 DIAGNOSIS — D72829 Elevated white blood cell count, unspecified: Secondary | ICD-10-CM

## 2017-06-25 DIAGNOSIS — M11262 Other chondrocalcinosis, left knee: Secondary | ICD-10-CM | POA: Diagnosis present

## 2017-06-25 LAB — CBC WITH DIFFERENTIAL/PLATELET
BASOS ABS: 0 10*3/uL (ref 0.0–0.1)
BASOS PCT: 0 %
EOS PCT: 1 %
Eosinophils Absolute: 0.1 10*3/uL (ref 0.0–0.7)
HCT: 39.2 % (ref 39.0–52.0)
Hemoglobin: 12.1 g/dL — ABNORMAL LOW (ref 13.0–17.0)
Lymphocytes Relative: 15 %
Lymphs Abs: 1.2 10*3/uL (ref 0.7–4.0)
MCH: 30.3 pg (ref 26.0–34.0)
MCHC: 30.9 g/dL (ref 30.0–36.0)
MCV: 98.2 fL (ref 78.0–100.0)
MONO ABS: 1.1 10*3/uL — AB (ref 0.1–1.0)
MONOS PCT: 13 %
Neutro Abs: 6 10*3/uL (ref 1.7–7.7)
Neutrophils Relative %: 71 %
PLATELETS: 169 10*3/uL (ref 150–400)
RBC: 3.99 MIL/uL — ABNORMAL LOW (ref 4.22–5.81)
RDW: 14.2 % (ref 11.5–15.5)
WBC: 8.5 10*3/uL (ref 4.0–10.5)

## 2017-06-25 LAB — COMPREHENSIVE METABOLIC PANEL
ALBUMIN: 2.2 g/dL — AB (ref 3.5–5.0)
ALT: 20 U/L (ref 17–63)
ANION GAP: 9 (ref 5–15)
AST: 23 U/L (ref 15–41)
Alkaline Phosphatase: 69 U/L (ref 38–126)
BUN: 17 mg/dL (ref 6–20)
CHLORIDE: 97 mmol/L — AB (ref 101–111)
CO2: 29 mmol/L (ref 22–32)
Calcium: 8.5 mg/dL — ABNORMAL LOW (ref 8.9–10.3)
Creatinine, Ser: 0.96 mg/dL (ref 0.61–1.24)
GFR calc Af Amer: >60 mL/min (ref >60)
GLUCOSE: 126 mg/dL — AB (ref 65–99)
POTASSIUM: 4.5 mmol/L (ref 3.5–5.1)
Sodium: 135 mmol/L (ref 135–145)
TOTAL PROTEIN: 5.4 g/dL — AB (ref 6.5–8.1)
Total Bilirubin: 1.6 mg/dL — ABNORMAL HIGH (ref 0.3–1.2)

## 2017-06-25 LAB — MAGNESIUM: MAGNESIUM: 1.7 mg/dL (ref 1.7–2.4)

## 2017-06-25 LAB — PHOSPHORUS: Phosphorus: 3.1 mg/dL (ref 2.5–4.6)

## 2017-06-25 MED ORDER — AMIODARONE HCL 200 MG PO TABS: 200.0000 mg | ORAL_TABLET | Freq: Three times a day (TID) | ORAL | 0 refills | Status: DC

## 2017-06-25 MED ORDER — METOPROLOL TARTRATE 50 MG PO TABS
50.0000 mg | ORAL_TABLET | Freq: Two times a day (BID) | ORAL | Status: DC
  Administered 2017-06-25 – 2017-07-10 (×28): 50 mg via ORAL
  Filled 2017-06-25 (×30): qty 1

## 2017-06-25 MED ORDER — POLYETHYLENE GLYCOL 3350 17 G PO PACK
17.0000 g | PACK | Freq: Every day | ORAL | Status: DC
  Administered 2017-06-26 – 2017-07-10 (×14): 17 g via ORAL
  Filled 2017-06-25 (×14): qty 1

## 2017-06-25 MED ORDER — RIVAROXABAN 15 MG PO TABS
15.0000 mg | ORAL_TABLET | Freq: Every day | ORAL | Status: DC
  Administered 2017-06-25 – 2017-07-09 (×15): 15 mg via ORAL
  Filled 2017-06-25 (×15): qty 1

## 2017-06-25 MED ORDER — DIGOXIN 125 MCG PO TABS: 0.1250 mg | ORAL_TABLET | Freq: Every day | ORAL | 0 refills | Status: DC

## 2017-06-25 MED ORDER — GUAIFENESIN-DM 100-10 MG/5ML PO SYRP
5.0000 mL | ORAL_SOLUTION | Freq: Four times a day (QID) | ORAL | Status: DC | PRN
  Administered 2017-07-02 – 2017-07-08 (×3): 10 mL via ORAL
  Filled 2017-06-25 (×3): qty 10

## 2017-06-25 MED ORDER — DIGOXIN 125 MCG PO TABS
0.1250 mg | ORAL_TABLET | Freq: Every day | ORAL | Status: DC
  Administered 2017-06-26 – 2017-07-10 (×15): 0.125 mg via ORAL
  Filled 2017-06-25 (×15): qty 1

## 2017-06-25 MED ORDER — ADULT MULTIVITAMIN W/MINERALS CH
1.0000 | ORAL_TABLET | Freq: Every day | ORAL | Status: DC
  Administered 2017-06-26 – 2017-07-10 (×15): 1 via ORAL
  Filled 2017-06-25 (×15): qty 1

## 2017-06-25 MED ORDER — RIVAROXABAN 15 MG PO TABS: 15.0000 mg | ORAL_TABLET | Freq: Every day | ORAL | Status: DC

## 2017-06-25 MED ORDER — DUTASTERIDE 0.5 MG PO CAPS
0.5000 mg | ORAL_CAPSULE | Freq: Every day | ORAL | Status: DC
  Administered 2017-06-26 – 2017-07-10 (×15): 0.5 mg via ORAL
  Filled 2017-06-25 (×15): qty 1

## 2017-06-25 MED ORDER — ONDANSETRON HCL 4 MG PO TABS: 4.0000 mg | ORAL_TABLET | Freq: Four times a day (QID) | ORAL | Status: DC | PRN

## 2017-06-25 MED ORDER — DIPHENHYDRAMINE HCL 12.5 MG/5ML PO ELIX
12.5000 mg | ORAL_SOLUTION | Freq: Four times a day (QID) | ORAL | Status: DC | PRN
  Administered 2017-07-07 (×2): 25 mg via ORAL
  Filled 2017-06-25 (×2): qty 10

## 2017-06-25 MED ORDER — TRAZODONE HCL 50 MG PO TABS
25.0000 mg | ORAL_TABLET | Freq: Every evening | ORAL | Status: DC | PRN
  Administered 2017-06-25: 50 mg via ORAL
  Filled 2017-06-25: qty 1

## 2017-06-25 MED ORDER — DOCUSATE SODIUM 100 MG PO CAPS: 100.0000 mg | ORAL_CAPSULE | Freq: Two times a day (BID) | ORAL | 0 refills | Status: DC | PRN

## 2017-06-25 MED ORDER — DILTIAZEM HCL 60 MG PO TABS
60.0000 mg | ORAL_TABLET | Freq: Three times a day (TID) | ORAL | Status: DC
  Administered 2017-06-25 – 2017-07-10 (×42): 60 mg via ORAL
  Filled 2017-06-25 (×47): qty 1

## 2017-06-25 MED ORDER — DILTIAZEM HCL 60 MG PO TABS
60.0000 mg | ORAL_TABLET | Freq: Three times a day (TID) | ORAL | Status: DC
  Administered 2017-06-25: 60 mg via ORAL
  Filled 2017-06-25: qty 1

## 2017-06-25 MED ORDER — FLEET ENEMA 7-19 GM/118ML RE ENEM: 1.0000 | ENEMA | Freq: Once | RECTAL | Status: DC | PRN

## 2017-06-25 MED ORDER — METOPROLOL TARTRATE 50 MG PO TABS: 50.0000 mg | ORAL_TABLET | Freq: Two times a day (BID) | ORAL | 0 refills | Status: AC

## 2017-06-25 MED ORDER — ACETAMINOPHEN 325 MG PO TABS
325.0000 mg | ORAL_TABLET | ORAL | Status: DC | PRN
  Administered 2017-06-30 – 2017-07-06 (×2): 650 mg via ORAL
  Administered 2017-07-07: 325 mg via ORAL
  Filled 2017-06-25 (×3): qty 2

## 2017-06-25 MED ORDER — DILTIAZEM HCL 60 MG PO TABS: 60.0000 mg | ORAL_TABLET | Freq: Three times a day (TID) | ORAL | 0 refills | Status: DC

## 2017-06-25 MED ORDER — AMIODARONE HCL 200 MG PO TABS
200.0000 mg | ORAL_TABLET | Freq: Three times a day (TID) | ORAL | Status: DC
  Administered 2017-06-25 – 2017-06-30 (×14): 200 mg via ORAL
  Filled 2017-06-25 (×14): qty 1

## 2017-06-25 MED ORDER — ALUM & MAG HYDROXIDE-SIMETH 200-200-20 MG/5ML PO SUSP: 30.0000 mL | ORAL | Status: DC | PRN

## 2017-06-25 MED ORDER — TRAMADOL HCL 50 MG PO TABS: 50.0000 mg | ORAL_TABLET | Freq: Four times a day (QID) | ORAL | Status: DC | PRN

## 2017-06-25 MED ORDER — POLYETHYLENE GLYCOL 3350 17 G PO PACK
17.0000 g | PACK | Freq: Every day | ORAL | Status: DC | PRN
  Filled 2017-06-25: qty 1

## 2017-06-25 MED ORDER — DOCUSATE SODIUM 100 MG PO CAPS: 100.0000 mg | ORAL_CAPSULE | Freq: Two times a day (BID) | ORAL | Status: DC | PRN

## 2017-06-25 MED ORDER — POLYETHYLENE GLYCOL 3350 17 G PO PACK: 17.0000 g | PACK | Freq: Every day | ORAL | 0 refills | Status: AC

## 2017-06-25 MED ORDER — DIGOXIN 125 MCG PO TABS
0.1250 mg | ORAL_TABLET | Freq: Every day | ORAL | Status: DC
  Administered 2017-06-25: 0.125 mg via ORAL
  Filled 2017-06-25: qty 1

## 2017-06-25 MED ORDER — ONDANSETRON HCL 4 MG/2ML IJ SOLN: 4.0000 mg | Freq: Four times a day (QID) | INTRAMUSCULAR | Status: DC | PRN

## 2017-06-25 MED ORDER — ENSURE ENLIVE PO LIQD: 237.0000 mL | Freq: Two times a day (BID) | ORAL | 12 refills | Status: DC

## 2017-06-25 MED ORDER — ATORVASTATIN CALCIUM 10 MG PO TABS
10.0000 mg | ORAL_TABLET | Freq: Every day | ORAL | Status: DC
  Administered 2017-06-25 – 2017-07-09 (×15): 10 mg via ORAL
  Filled 2017-06-25 (×15): qty 1

## 2017-06-25 MED ORDER — BISACODYL 10 MG RE SUPP: 10.0000 mg | Freq: Every day | RECTAL | Status: DC | PRN

## 2017-06-25 MED ORDER — TAMSULOSIN HCL 0.4 MG PO CAPS
0.4000 mg | ORAL_CAPSULE | Freq: Every day | ORAL | Status: DC
  Administered 2017-06-26 – 2017-07-10 (×15): 0.4 mg via ORAL
  Filled 2017-06-25 (×15): qty 1

## 2017-06-25 NOTE — H&P (Signed)
Physical Medicine and Rehabilitation Admission H&P    Chief Complaint  Patient presents with  . Debility    HPI:   Jeremy Macdonald is a 84 y.o. male with history of CAD, dementia, COPD, HOH, fall on 06/17/17 with nasal fracture and recurrent falls due to dizziness and weakness. He was admitted on 06/20/17 with weakness, inability of family to care for patient and was noted to have A fib with RVR, leucocytosis and dehydration. He was started IVF due to concerns of dehydration as well as IV amiodarone and IV cardizem.  History taken from chart review and wife. Foley placed due to urinary retention and Dr. Alyson Ingles consulted due to hematuria from foley trauma. Patient with history of BPH with frequency therefore flomax increased to bid with recommendations to keep foley in place till mobile--to start CBI if hematuria worsens. CT head reviewed, unremarkable for acute intracranial process. Therapy ongoing and patient showing decrease in dizziness and improvement in activity tolerance. CIR recommended due to functional deficits.    Review of Systems  HENT: Positive for hearing loss. Negative for tinnitus.   Eyes: Negative for blurred vision and double vision.  Respiratory: Negative for shortness of breath.   Cardiovascular: Negative for chest pain and palpitations.  Gastrointestinal: Negative for constipation, heartburn and nausea.  Genitourinary:       Denies prior bladder issues but could not recall BPH history   Musculoskeletal: Positive for joint pain (chronic bilateral knee pain--worse since fall) and myalgias.  Skin: Negative for itching and rash.  Neurological: Positive for dizziness, focal weakness and weakness. Negative for headaches.  Psychiatric/Behavioral: Positive for memory loss.  All other systems reviewed and are negative.    Past Medical History:  Diagnosis Date  . Abdominal aortic aneurysm (AAA) ([place])   . Arthritis    "shoulders" (04/24/2017)  . Atrial fibrillation  with RVR ([place]) 03/2017   [name] 03/08/2017  . COPD (chronic obstructive pulmonary disease) ([place])   . Coronary artery disease   . Dementia   . Fall at home 06/17/2017   nasal fracture  . High cholesterol   . HOH (hard of hearing)   . Hypertension   . Pneumonia ~ 2003-2008 X 5   "5 straight years S/P pneumonia shot in ~ 2003"(04/24/2017)  . Skin cancer 2018   "cut it out of right arm"    Past Surgical History:  Procedure Laterality Date  . APPENDECTOMY    . CARDIAC CATHETERIZATION    . CATARACT EXTRACTION, BILATERAL Bilateral   . CORONARY ANGIOPLASTY WITH STENT PLACEMENT    . CORONARY ATHERECTOMY N/A 04/24/2017   Procedure: CORONARY ATHERECTOMY;  Surgeon: Adrian Prows, MD;  Location: [facility];  Service: Cardiovascular;  Laterality: N/A;  . CORONARY STENT INTERVENTION N/A 04/24/2017   Procedure: CORONARY STENT INTERVENTION;  Surgeon: Adrian Prows, MD;  Location: [facility];  Service: Cardiovascular;  Laterality: N/A;  . INCISION AND DRAINAGE  1950s   "RLE; got hurt in service; had to get a bunch of stuff out"  . LEFT HEART CATH AND CORONARY ANGIOGRAPHY N/A 03/19/2017   Procedure: LEFT HEART CATH AND CORONARY ANGIOGRAPHY;  Surgeon: Adrian Prows, MD;  Location: [facility];  Service: Cardiovascular;  Laterality: N/A;  . SKIN CANCER EXCISION Right 2018   "arms"  . TOOTH EXTRACTION      Family History  Problem Relation Age of Onset  . Heart attack Father   . Cancer Sister   . Lung cancer Brother  Social History:  Married. He reports that he has been smoking cigarettes.  He smoked for 66 years--used to smoke " a lot" but down to 1 PPD a chew. . He has quit using smokeless tobacco. He reports that he does not drink alcohol or use drugs.      Allergies  Allergen Reactions  . Phenergan [Promethazine Hcl] Other (See Comments)    Confusion    Medications Prior to Admission  Medication Sig Dispense Refill  . acetaminophen (TYLENOL) 325 MG tablet Take 325 mg  daily as needed by mouth for moderate pain.     Marland Kitchen atorvastatin (LIPITOR) 10 MG tablet Take 10 mg by mouth daily.    . benazepril-hydrochlorthiazide (LOTENSIN HCT) 20-25 MG tablet Take 1 tablet by mouth daily.    . cephALEXin (KEFLEX) 500 MG capsule Take 1 capsule (500 mg total) by mouth 3 (three) times daily. 15 capsule 0  . clopidogrel (PLAVIX) 75 MG tablet Take 1 tablet (75 mg total) by mouth daily with breakfast. 30 tablet 0  . Dutasteride-Tamsulosin HCl (JALYN) 0.5-0.4 MG CAPS Take 1 tablet by mouth daily.    . metoprolol tartrate (LOPRESSOR) 25 MG tablet Take 2 tablets (50 mg total) by mouth 3 (three) times daily. (Patient taking differently: Take 50 mg by mouth 2 (two) times daily. ) 90 tablet 0  . rivaroxaban (XARELTO) 15 MG TABS tablet Take 1 tablet (15 mg total) by mouth daily with supper. Start this evening    . traMADol (ULTRAM) 50 MG tablet Take 1 tablet (50 mg total) by mouth every 6 (six) hours as needed. 20 tablet 0    Drug Regimen Review  Drug regimen was reviewed and remains appropriate with no significant issues identified  Home: Home Living Family/patient expects to be discharged to:: Skilled nursing facility Living Arrangements: Spouse/significant other Available Help at Discharge: Family, Available 24 hours/day Type of Home: Apartment Home Access: Level entry Home Layout: One level Bathroom Shower/Tub: Multimedia programmer: Handicapped height Bathroom Accessibility: Yes Home Equipment: Environmental consultant - standard  Lives With: Spouse   Functional History: Prior Function Level of Independence: Independent with assistive device(s) Comments: Per PT used standard walker for ambulation but reported to OT no assistive device  Functional Status:  Mobility: Bed Mobility Overal bed mobility: Needs Assistance Bed Mobility: Rolling, Sidelying to Sit Rolling: Min assist Sidelying to sit: Min assist Supine to sit: Mod assist, +2 for physical assistance Sit to supine:  Mod assist, +2 for physical assistance General bed mobility comments: min A for trunk and LEs, guarded due to neck pain Transfers Overall transfer level: Needs assistance Equipment used: Rolling walker (2 wheeled) Transfers: Sit to/from Stand Sit to Stand: Min assist General transfer comment: cues for hand placement, min A to stand, HR 94 bpm with standing Ambulation/Gait Ambulation/Gait assistance: Min assist Ambulation Distance (Feet): 50 Feet Assistive device: Rolling walker (2 wheeled) General Gait Details: min A for steadying assist, cues for safety with RW, pt requires 2 x standing rest break during gait    ADL: ADL Overall ADL's : Needs assistance/impaired Eating/Feeding: Supervision/ safety, Sitting Grooming: Supervision/safety, Sitting Upper Body Bathing: Minimal assistance, Sitting Lower Body Bathing: Moderate assistance, Sit to/from stand Upper Body Dressing : Minimal assistance, Sitting Lower Body Dressing: Moderate assistance, Sit to/from stand Toilet Transfer: Minimal assistance, Ambulation, RW Toilet Transfer Details (indicate cue type and reason): from elevated surface with cues for safe hand placement. Toileting- Clothing Manipulation and Hygiene: Sit to/from stand, Moderate assistance, Minimal assistance Functional mobility during  ADLs: Minimal assistance, Rolling walker General ADL Comments: Pt unsteady and with decreased activity tolerance for ADL. Limited by neck pain.   Cognition: Cognition Overall Cognitive Status: (wife denies much confusion at baseline. Son states mild conf) Orientation Level: Oriented X4 Cognition Arousal/Alertness: Awake/alert Behavior During Therapy: WFL for tasks assessed/performed Overall Cognitive Status: (wife denies much confusion at baseline. Son states mild conf) General Comments: History of dementia  Physical Exam: Blood pressure (!) 101/57, pulse (!) 119, temperature 98.1 F (36.7 C), temperature source Oral, resp. rate  13, height '5\' 9"'$  (1.753 m), weight 78.3 kg (172 lb 9.6 oz), SpO2 93 %. Physical Exam  Nursing note and vitals reviewed. Constitutional: He is oriented to person, place, and time. He appears well-developed and well-nourished. No distress.  Multiple facial abrasions with scabs. Dependent ecchymosis on chin and down the neck.   HENT:  Head: Normocephalic and atraumatic.  Mouth/Throat: Oropharynx is clear and moist.  Nasal bridge with sutured abrasion.   Eyes: EOM are normal. Pupils are equal, round, and reactive to light.  Neck: Normal range of motion. Neck supple.  Cardiovascular: Normal rate.  Respiratory: Effort normal and breath sounds normal. No stridor. No respiratory distress. He exhibits no tenderness.  GI: Soft. Bowel sounds are normal. He exhibits no distension. There is no tenderness.  Musculoskeletal: He exhibits edema.  Edema with mild erythema right knee--question due to effusion. Keeps RLE eternally rotated and pain with attempts at ROM. 1+ pitting edema RLE> LLE  Neurological: He is alert and oriented to person, place, and time. A cranial nerve deficit is present.  HOH. Mild dysarthria and had delay in following commands due to hearing loss?  Motor: B/l UE 4/5 proximal to distal B/l LE: HF 2/5, KE 2+/5, ADF/PF 4+/5     Skin: Skin is warm and dry. He is not diaphoretic.  Psychiatric: He has a normal mood and affect. He is slowed.    Results for orders placed or performed during the hospital encounter of 06/20/17 (from the past 48 hour(s))  CBC with Differential/Platelet     Status: Abnormal   Collection Time: 06/24/17  6:23 AM  Result Value Ref Range   WBC 9.4 4.0 - 10.5 K/uL   RBC 4.04 (L) 4.22 - 5.81 MIL/uL   Hemoglobin 12.3 (L) 13.0 - 17.0 g/dL   HCT 39.5 39.0 - 52.0 %   MCV 97.8 78.0 - 100.0 fL   MCH 30.4 26.0 - 34.0 pg   MCHC 31.1 30.0 - 36.0 g/dL   RDW 14.4 11.5 - 15.5 %   Platelets 162 150 - 400 K/uL   Neutrophils Relative % 77 %   Neutro Abs 7.2 1.7 - 7.7  K/uL   Lymphocytes Relative 7 %   Lymphs Abs 0.7 0.7 - 4.0 K/uL   Monocytes Relative 15 %   Monocytes Absolute 1.4 (H) 0.1 - 1.0 K/uL   Eosinophils Relative 1 %   Eosinophils Absolute 0.1 0.0 - 0.7 K/uL   Basophils Relative 0 %   Basophils Absolute 0.0 0.0 - 0.1 K/uL  Comprehensive metabolic panel     Status: Abnormal   Collection Time: 06/24/17  6:23 AM  Result Value Ref Range   Sodium 136 135 - 145 mmol/L   Potassium 4.0 3.5 - 5.1 mmol/L   Chloride 98 (L) 101 - 111 mmol/L   CO2 26 22 - 32 mmol/L   Glucose, Bld 121 (H) 65 - 99 mg/dL   BUN 16 6 - 20 mg/dL   Creatinine,  Ser 0.89 0.61 - 1.24 mg/dL   Calcium 8.5 (L) 8.9 - 10.3 mg/dL   Total Protein 5.5 (L) 6.5 - 8.1 g/dL   Albumin 2.3 (L) 3.5 - 5.0 g/dL   AST 24 15 - 41 U/L   ALT 20 17 - 63 U/L   Alkaline Phosphatase 70 38 - 126 U/L   Total Bilirubin 1.7 (H) 0.3 - 1.2 mg/dL   GFR calc non Af Amer >60 >60 mL/min   GFR calc Af Amer >60 >60 mL/min    Comment: (NOTE) The eGFR has been calculated using the CKD EPI equation. This calculation has not been validated in all clinical situations. eGFR's persistently <60 mL/min signify possible Chronic Kidney Disease.    Anion gap 12 5 - 15  Magnesium     Status: None   Collection Time: 06/24/17  6:23 AM  Result Value Ref Range   Magnesium 1.8 1.7 - 2.4 mg/dL  Phosphorus     Status: None   Collection Time: 06/24/17  6:23 AM  Result Value Ref Range   Phosphorus 3.0 2.5 - 4.6 mg/dL  CBC with Differential/Platelet     Status: Abnormal   Collection Time: 06/25/17  3:42 AM  Result Value Ref Range   WBC 8.5 4.0 - 10.5 K/uL   RBC 3.99 (L) 4.22 - 5.81 MIL/uL   Hemoglobin 12.1 (L) 13.0 - 17.0 g/dL   HCT 39.2 39.0 - 52.0 %   MCV 98.2 78.0 - 100.0 fL   MCH 30.3 26.0 - 34.0 pg   MCHC 30.9 30.0 - 36.0 g/dL   RDW 14.2 11.5 - 15.5 %   Platelets 169 150 - 400 K/uL   Neutrophils Relative % 71 %   Neutro Abs 6.0 1.7 - 7.7 K/uL   Lymphocytes Relative 15 %   Lymphs Abs 1.2 0.7 - 4.0 K/uL    Monocytes Relative 13 %   Monocytes Absolute 1.1 (H) 0.1 - 1.0 K/uL   Eosinophils Relative 1 %   Eosinophils Absolute 0.1 0.0 - 0.7 K/uL   Basophils Relative 0 %   Basophils Absolute 0.0 0.0 - 0.1 K/uL  Comprehensive metabolic panel     Status: Abnormal   Collection Time: 06/25/17  3:42 AM  Result Value Ref Range   Sodium 135 135 - 145 mmol/L   Potassium 4.5 3.5 - 5.1 mmol/L   Chloride 97 (L) 101 - 111 mmol/L   CO2 29 22 - 32 mmol/L   Glucose, Bld 126 (H) 65 - 99 mg/dL   BUN 17 6 - 20 mg/dL   Creatinine, Ser 0.96 0.61 - 1.24 mg/dL   Calcium 8.5 (L) 8.9 - 10.3 mg/dL   Total Protein 5.4 (L) 6.5 - 8.1 g/dL   Albumin 2.2 (L) 3.5 - 5.0 g/dL   AST 23 15 - 41 U/L   ALT 20 17 - 63 U/L   Alkaline Phosphatase 69 38 - 126 U/L   Total Bilirubin 1.6 (H) 0.3 - 1.2 mg/dL   GFR calc non Af Amer >60 >60 mL/min   GFR calc Af Amer >60 >60 mL/min    Comment: (NOTE) The eGFR has been calculated using the CKD EPI equation. This calculation has not been validated in all clinical situations. eGFR's persistently <60 mL/min signify possible Chronic Kidney Disease.    Anion gap 9 5 - 15  Magnesium     Status: None   Collection Time: 06/25/17  3:42 AM  Result Value Ref Range   Magnesium 1.7 1.7 - 2.4  mg/dL  Phosphorus     Status: None   Collection Time: 06/25/17  3:42 AM  Result Value Ref Range   Phosphorus 3.1 2.5 - 4.6 mg/dL   No results found.     Medical Problem List and Plan: 1.  Deficits in mobility and self-care secondary to debility,. 2.  DVT Prophylaxis/Anticoagulation: Pharmaceutical: Other (comment)--on Xarelto.  3. Pain Management: continue ultram prn 4. Mood: Ego support 5. Neuropsych: This patient is?  Not fully capable of making decisions on his own behalf. 6. Skin/Wound Care: pressure relief measures.  7. Fluids/Electrolytes/Nutrition: Monitor I/O. Check lytes in am.  8. A fib with RVR: on amiodarone and metoprolol. Has resolved --started on lanoxin today and Cardizem  titrated upwards to 60 mg tid. Monitor for orthostatic symptoms.  9.  Urinary retention: Continue foley for now. Monitor hematuria --on Eliquis as well as H/H. Check UA/UCS. Continue Avodart and Flomax. 10. Bilateral Knee OA: Will check right knee X rays due to fall and right knee effusion.   11. Dizziness: Likely due to A flutter. Will check orthostatic vitals. 12. Abnormal LFTs: Likely due to shocked liver and resolving hematoma.    Post Admission Physician Evaluation: 1. Preadmission assessment reviewed and changes made below. 2. Functional deficits secondary  to debility. 3. Patient is admitted to receive collaborative, interdisciplinary care between the physiatrist, rehab nursing staff, and therapy team. 4. Patient's level of medical complexity and substantial therapy needs in context of that medical necessity cannot be provided at a lesser intensity of care such as a SNF. 5. Patient has experienced substantial functional loss from his/her baseline which was documented above under the "Functional History" and "Functional Status" headings.  Judging by the patient's diagnosis, physical exam, and functional history, the patient has potential for functional progress which will result in measurable gains while on inpatient rehab.  These gains will be of substantial and practical use upon discharge  in facilitating mobility and self-care at the household level. 6. Physiatrist will provide 24 hour management of medical needs as well as oversight of the therapy plan/treatment and provide guidance as appropriate regarding the interaction of the two. 7. The Preadmission Screening has been reviewed and patient status is unchanged unless otherwise stated above. 8. 24 hour rehab nursing will assist with bladder management, safety, skin/wound care, disease management, pain management and patient education and help integrate therapy concepts, techniques,education, etc. 9. PT will assess and treat for/with:  Lower extremity strength, range of motion, stamina, balance, functional mobility, safety, adaptive techniques and equipment, woundcare, coping skills, pain control, education.   Goals are: Modified independent/supervision. 10. OT will assess and treat for/with: ADL's, functional mobility, safety, upper extremity strength, adaptive techniques and equipment, wound mgt, ego support, and community reintegration.   Goals are: Modified independent/supervision. Therapy may proceed with showering this patient. 11. Case Management and Social Worker will assess and treat for psychological issues and discharge planning. 12. Team conference will be held weekly to assess progress toward goals and to determine barriers to discharge. 13. Patient will receive at least 3 hours of therapy per day at least 5 days per week. 14. ELOS: 7-12 days.       15. Prognosis:  excellent and good  Delice Lesch, MD, ABPMR Bary Leriche, PA-C 06/25/2017

## 2017-06-25 NOTE — Progress Notes (Addendum)
I have text Dr. Alfredia Ferguson to request order for an inpt rehab consult per Son's request for Rehab MD assessment for an inpt rehab consult. I await consult before proceeding further. I spoke with pt's son by phone and he is aware. Pt was driving and ambulatory prior to fall Sunday.[phone]

## 2017-06-25 NOTE — Consult Note (Signed)
Physical Medicine and Rehabilitation Consult   Reason for Consult: Debility Referring Physician: Dr. Alfredia Ferguson.    HPI: Jeremy GORKA is a 84 y.o. male with history of CAD, dementia, COPD, HOH who was admitted on 06/20/17 with fall due to weakness and dizziness. History taken from chart review and wife. He was found to have nasal fracture as well as mild leucocytosis. He was started on IVF due to concerns of dehydration. Cardiology recommended IV amiodraone and Cardizem due ot A fib with RVR. Foley placed due to urinary retention and Dr. Alyson Ingles consulted due to hematuria from foley trauma. Patient with history of BPH with frequency therefore flomax increased to bid with recommendations to keep foley in place till mobile--to start CBI if hematuria worsens. CT head reviewed, unremarkable for acute intracranial process. Therapy ongoing and patient showing decrease in dizziness and imporvement in acitivty tolerance. CIR recommended due to functional deficits.    Review of Systems  HENT: Positive for hearing loss.   Eyes: Negative for blurred vision and double vision.  Respiratory: Negative for cough and shortness of breath.   Cardiovascular: Negative for chest pain and palpitations.  Gastrointestinal: Negative for heartburn and nausea.  Genitourinary: Negative for dysuria and urgency.  Musculoskeletal: Positive for joint pain (bilateral knee pain--shaky but refuses to use a walker) and myalgias.  Neurological: Positive for dizziness. Negative for speech change and focal weakness.  Psychiatric/Behavioral: Positive for memory loss.  All other systems reviewed and are negative.    Past Medical History:  Diagnosis Date  . Abdominal aortic aneurysm (AAA) ([place])   . Arthritis    "shoulders" (04/24/2017)  . Atrial fibrillation with RVR ([place]) 03/2017   [name] 03/08/2017  . COPD (chronic obstructive pulmonary disease) ([place])   . Coronary artery disease   . Dementia   . Fall at home  06/17/2017   nasal fracture  . High cholesterol   . HOH (hard of hearing)   . Hypertension   . Pneumonia ~ 2003-2008 X 5   "5 straight years S/P pneumonia shot in ~ 2003"(04/24/2017)  . Skin cancer 2018   "cut it out of right arm"    Past Surgical History:  Procedure Laterality Date  . APPENDECTOMY    . CARDIAC CATHETERIZATION    . CATARACT EXTRACTION, BILATERAL Bilateral   . CORONARY ANGIOPLASTY WITH STENT PLACEMENT    . CORONARY ATHERECTOMY N/A 04/24/2017   Procedure: CORONARY ATHERECTOMY;  Surgeon: Adrian Prows, MD;  Location: [facility];  Service: Cardiovascular;  Laterality: N/A;  . CORONARY STENT INTERVENTION N/A 04/24/2017   Procedure: CORONARY STENT INTERVENTION;  Surgeon: Adrian Prows, MD;  Location: [facility];  Service: Cardiovascular;  Laterality: N/A;  . INCISION AND DRAINAGE  1950s   "RLE; got hurt in service; had to get a bunch of stuff out"  . LEFT HEART CATH AND CORONARY ANGIOGRAPHY N/A 03/19/2017   Procedure: LEFT HEART CATH AND CORONARY ANGIOGRAPHY;  Surgeon: Adrian Prows, MD;  Location: [facility];  Service: Cardiovascular;  Laterality: N/A;  . SKIN CANCER EXCISION Right 2018   "arms"  . TOOTH EXTRACTION      Family History  Problem Relation Age of Onset  . Heart attack Father   . Cancer Sister   . Lung cancer Brother     Social History:  Married. He reports that he has been smoking cigarettes.  He smoked for 66 years--used to smoke " a lot" but down to 1 PPD a  chew. . He has quit using smokeless tobacco. He reports that he does not drink alcohol or use drugs.     Allergies  Allergen Reactions  . Phenergan [Promethazine Hcl] Other (See Comments)    Confusion     Medications Prior to Admission  Medication Sig Dispense Refill  . acetaminophen (TYLENOL) 325 MG tablet Take 325 mg daily as needed by mouth for moderate pain.     Marland Kitchen atorvastatin (LIPITOR) 10 MG tablet Take 10 mg by mouth daily.    . benazepril-hydrochlorthiazide  (LOTENSIN HCT) 20-25 MG tablet Take 1 tablet by mouth daily.    . cephALEXin (KEFLEX) 500 MG capsule Take 1 capsule (500 mg total) by mouth 3 (three) times daily. 15 capsule 0  . clopidogrel (PLAVIX) 75 MG tablet Take 1 tablet (75 mg total) by mouth daily with breakfast. 30 tablet 0  . Dutasteride-Tamsulosin HCl (JALYN) 0.5-0.4 MG CAPS Take 1 tablet by mouth daily.    . metoprolol tartrate (LOPRESSOR) 25 MG tablet Take 2 tablets (50 mg total) by mouth 3 (three) times daily. (Patient taking differently: Take 50 mg by mouth 2 (two) times daily. ) 90 tablet 0  . rivaroxaban (XARELTO) 15 MG TABS tablet Take 1 tablet (15 mg total) by mouth daily with supper. Start this evening    . traMADol (ULTRAM) 50 MG tablet Take 1 tablet (50 mg total) by mouth every 6 (six) hours as needed. 20 tablet 0    Home: Home Living Family/patient expects to be discharged to:: Skilled nursing facility Living Arrangements: Spouse/significant other Available Help at Discharge: Family, Available 24 hours/day Type of Home: Apartment Home Access: Level entry Home Layout: One level Bathroom Shower/Tub: Multimedia programmer: Handicapped height Home Equipment: Environmental consultant - standard  Functional History: Prior Function Level of Independence: Independent with assistive device(s) Comments: Per PT used standard walker for ambulation but reported to OT no assistive device Functional Status:  Mobility: Bed Mobility Overal bed mobility: Needs Assistance Bed Mobility: Rolling, Sidelying to Sit Rolling: Min assist Sidelying to sit: Min assist Supine to sit: Mod assist, +2 for physical assistance Sit to supine: Mod assist, +2 for physical assistance General bed mobility comments: min A for trunk and LEs, guarded due to neck pain Transfers Overall transfer level: Needs assistance Equipment used: Rolling walker (2 wheeled) Transfers: Sit to/from Stand Sit to Stand: Min assist General transfer comment: cues for hand  placement, min A to stand, HR 94 bpm with standing Ambulation/Gait Ambulation/Gait assistance: Min assist Ambulation Distance (Feet): 50 Feet Assistive device: Rolling walker (2 wheeled) General Gait Details: min A for steadying assist, cues for safety with RW, pt requires 2 x standing rest break during gait    ADL: ADL Overall ADL's : Needs assistance/impaired Eating/Feeding: Supervision/ safety, Sitting Grooming: Supervision/safety, Sitting Upper Body Bathing: Minimal assistance, Sitting Lower Body Bathing: Moderate assistance, Sit to/from stand Upper Body Dressing : Minimal assistance, Sitting Lower Body Dressing: Moderate assistance, Sit to/from stand Toilet Transfer: Minimal assistance, Ambulation, RW Toilet Transfer Details (indicate cue type and reason): from elevated surface with cues for safe hand placement. Toileting- Clothing Manipulation and Hygiene: Sit to/from stand, Moderate assistance, Minimal assistance Functional mobility during ADLs: Minimal assistance, Rolling walker General ADL Comments: Pt unsteady and with decreased activity tolerance for ADL. Limited by neck pain.   Cognition: Cognition Overall Cognitive Status: History of cognitive impairments - at baseline Orientation Level: Oriented X4 Cognition Arousal/Alertness: Awake/alert Behavior During Therapy: WFL for tasks assessed/performed Overall Cognitive Status: History of cognitive  impairments - at baseline General Comments: History of dementia   Blood pressure 113/79, pulse 90, temperature (!) 97.4 F (36.3 C), temperature source Oral, resp. rate 12, height 5\' 9"  (1.753 m), weight 78.3 kg (172 lb 9.6 oz), SpO2 92 %. Physical Exam  Nursing note and vitals reviewed. Constitutional: He is oriented to person, place, and time. He appears well-developed. No distress.  Multiple facial abrasions. Healing abrasion nasal bridge with sutures. Ecchymotic area chin and neck.  Frail  HENT:  Head: Normocephalic and  atraumatic.  Eyes: Conjunctivae and EOM are normal. Pupils are equal, round, and reactive to light.  Neck: Normal range of motion. Neck supple.  Cardiovascular: An irregularly irregular rhythm present. Tachycardia present.  Respiratory: Effort normal and breath sounds normal. No stridor. No respiratory distress.  GI: Soft. Bowel sounds are normal. He exhibits no distension. There is no tenderness.  Genitourinary:  Genitourinary Comments: +Foley with blood tinged drainage  Musculoskeletal:  Pain with ROM of right knees.   Neurological: He is alert and oriented to person, place, and time.  HOH Motor: B/l UE 4/5 proximal to distal B/l LE: HF 2/5, KE 2+/5, ADF/PF 4+/5  Skin: Skin is warm and dry. He is not diaphoretic.  Psychiatric: He has a normal mood and affect. He is slowed.    Results for orders placed or performed during the hospital encounter of 06/20/17 (from the past 24 hour(s))  CBC with Differential/Platelet     Status: Abnormal   Collection Time: 06/25/17  3:42 AM  Result Value Ref Range   WBC 8.5 4.0 - 10.5 K/uL   RBC 3.99 (L) 4.22 - 5.81 MIL/uL   Hemoglobin 12.1 (L) 13.0 - 17.0 g/dL   HCT 39.2 39.0 - 52.0 %   MCV 98.2 78.0 - 100.0 fL   MCH 30.3 26.0 - 34.0 pg   MCHC 30.9 30.0 - 36.0 g/dL   RDW 14.2 11.5 - 15.5 %   Platelets 169 150 - 400 K/uL   Neutrophils Relative % 71 %   Neutro Abs 6.0 1.7 - 7.7 K/uL   Lymphocytes Relative 15 %   Lymphs Abs 1.2 0.7 - 4.0 K/uL   Monocytes Relative 13 %   Monocytes Absolute 1.1 (H) 0.1 - 1.0 K/uL   Eosinophils Relative 1 %   Eosinophils Absolute 0.1 0.0 - 0.7 K/uL   Basophils Relative 0 %   Basophils Absolute 0.0 0.0 - 0.1 K/uL  Comprehensive metabolic panel     Status: Abnormal   Collection Time: 06/25/17  3:42 AM  Result Value Ref Range   Sodium 135 135 - 145 mmol/L   Potassium 4.5 3.5 - 5.1 mmol/L   Chloride 97 (L) 101 - 111 mmol/L   CO2 29 22 - 32 mmol/L   Glucose, Bld 126 (H) 65 - 99 mg/dL   BUN 17 6 - 20 mg/dL    Creatinine, Ser 0.96 0.61 - 1.24 mg/dL   Calcium 8.5 (L) 8.9 - 10.3 mg/dL   Total Protein 5.4 (L) 6.5 - 8.1 g/dL   Albumin 2.2 (L) 3.5 - 5.0 g/dL   AST 23 15 - 41 U/L   ALT 20 17 - 63 U/L   Alkaline Phosphatase 69 38 - 126 U/L   Total Bilirubin 1.6 (H) 0.3 - 1.2 mg/dL   GFR calc non Af Amer >60 >60 mL/min   GFR calc Af Amer >60 >60 mL/min   Anion gap 9 5 - 15  Magnesium     Status: None  Collection Time: 06/25/17  3:42 AM  Result Value Ref Range   Magnesium 1.7 1.7 - 2.4 mg/dL  Phosphorus     Status: None   Collection Time: 06/25/17  3:42 AM  Result Value Ref Range   Phosphorus 3.1 2.5 - 4.6 mg/dL   No results found.  Assessment/Plan: Diagnosis: Debility Labs and images independently reviewed.  Records reviewed and summated above.  1. Does the need for close, 24 hr/day medical supervision in concert with the patient's rehab needs make it unreasonable for this patient to be served in a less intensive setting? Yes  2. Co-Morbidities requiring supervision/potential complications:  CAD (cont meds), dementia, COPD (monitor O2 sats and RR with increased exertion), HOH, leukocytosis (cont to monitor for signs and symptoms of infection, further workup if indicated), A fib with RVR (monitor HR with increased mobility), urinary retention (cont foley), hematuria (recs per Urology), BPH (cont meds), ABLA (transfuse if necessary to ensure appropriate perfusion for increased activity tolerance), hypoalbuminemia (maximize nutrition for overall health and wound healing) 3. Due to bladder management, safety, skin/wound care, disease management, pain management and patient education, does the patient require 24 hr/day rehab nursing? Yes 4. Does the patient require coordinated care of a physician, rehab nurse, PT (1-2 hrs/day, 5 days/week) and OT (1-2 hrs/day, 5 days/week) to address physical and functional deficits in the context of the above medical diagnosis(es)? Yes Addressing deficits in the  following areas: balance, endurance, locomotion, strength, transferring, bathing, dressing, toileting and psychosocial support 5. Can the patient actively participate in an intensive therapy program of at least 3 hrs of therapy per day at least 5 days per week? Yes 6. The potential for patient to make measurable gains while on inpatient rehab is excellent 7. Anticipated functional outcomes upon discharge from inpatient rehab are modified independent and supervision  with PT, modified independent and supervision with OT, n/a with SLP. 8. Estimated rehab length of stay to reach the above functional goals is: 7-12 days. 9. Anticipated D/C setting: Home 10. Anticipated post D/C treatments: HH therapy and Home excercise program 11. Overall Rehab/Functional Prognosis: excellent and good  RECOMMENDATIONS: This patient's condition is appropriate for continued rehabilitative care in the following setting: CIR Patient has agreed to participate in recommended program. Yes Note that insurance prior authorization may be required for reimbursement for recommended care.  Comment: Rehab Admissions Coordinator to follow up.  Delice Lesch, MD, ABPMR Bary Leriche, PA-C 06/25/2017

## 2017-06-25 NOTE — Progress Notes (Signed)
Subjective:  He is feeling much more improved with regard to his strength today. He ambulated in the room, but still weak.  His wife  present at the bedside.  Denies any chest pain or shortness of breath.  Patient had elevated heart rate when they tried to ambulate.  Objective:  Vital Signs in the last 24 hours: Temp:  [97.4 F (36.3 C)-98.4 F (36.9 C)] 97.4 F (36.3 C) (01/21 0439) Pulse Rate:  [90-122] 90 (01/21 0439) Resp:  [12-19] 12 (01/21 0439) BP: (101-136)/(57-94) 113/79 (01/21 0601) SpO2:  [93 %-99 %] 96 % (01/21 0439) Weight:  [78.3 kg (172 lb 9.6 oz)] 78.3 kg (172 lb 9.6 oz) (01/21 0439)  Intake/Output from previous day: 01/20 0701 - 01/21 0700 In: 960 [P.O.:960] Out: 840 [Urine:840]  Physical Exam: Blood pressure 113/78, pulse 90, temperature 97.8 F (36.6 C), temperature source Oral, resp. rate 16, height '5\' 9"'$  (1.753 m), weight 76.4 kg (168 lb 6.9 oz), SpO2 98 %.  General appearance: alert, cooperative, appears stated age and no distress Eyes: negative findings: lids and lashes normal Neck: extensive ecchymosis of the face and neck noted.  No JVD Neck: JVP - normal, carotids 2+= without bruits Resp: Clear breath sounds Chest wall: no tenderness Cardio: S1 variable, S2 normal, distant heart sounds, no murmur appreciated. Tachycardic.  GI: soft, non-tender; bowel sounds normal; no masses,  no organomegaly Extremities: extremities normal, atraumatic, no cyanosis or edema    Lab Results: BMP Recent Labs    06/23/17 0743 06/24/17 0623 06/25/17 0342  NA 136 136 135  K 4.2 4.0 4.5  CL 99* 98* 97*  CO2 '27 26 29  '$ GLUCOSE 113* 121* 126*  BUN '17 16 17  '$ CREATININE 0.90 0.89 0.96  CALCIUM 8.4* 8.5* 8.5*  GFRNONAA >60 >60 >60  GFRAA >60 >60 >60    CBC Recent Labs  Lab 06/25/17 0342  WBC 8.5  RBC 3.99*  HGB 12.1*  HCT 39.2  PLT 169  MCV 98.2  MCH 30.3  MCHC 30.9  RDW 14.2  LYMPHSABS 1.2  MONOABS 1.1*  EOSABS 0.1  BASOSABS 0.0    HEMOGLOBIN  A1C No results found for: HGBA1C, MPG  Cardiac Panel (last 3 results) Recent Labs    06/20/17 1033 06/20/17 1233  CKTOTAL 90  --   TROPONINI  --  0.03*   Hepatic Function Panel Recent Labs    06/23/17 0743 06/24/17 0623 06/25/17 0342  PROT 5.2* 5.5* 5.4*  ALBUMIN 2.2* 2.3* 2.2*  AST '24 24 23  '$ ALT '18 20 20  '$ ALKPHOS 59 70 69  BILITOT 1.3* 1.7* 1.6*   Cardiac Studies:  EKG 06/20/2017: Atrial fibrillation with rapid ventricle response at the rate of 124 bpm, normal axis. Poor R progression, probably normal variant, cannot exclude anteroseptal infarct old. Nonspecific T abnormality.  Scheduled Meds: . amiodarone  200 mg Oral TID  . atorvastatin  10 mg Oral Q2000  . diltiazem  30 mg Oral Q8H  . dutasteride  0.5 mg Oral Daily   And  . tamsulosin  0.4 mg Oral Daily  . feeding supplement (ENSURE ENLIVE)  237 mL Oral BID BM  . metoprolol tartrate  50 mg Oral BID  . multivitamin with minerals  1 tablet Oral Daily  . polyethylene glycol  17 g Oral Daily  . Rivaroxaban  15 mg Oral Q supper   Continuous Infusions: PRN Meds:.acetaminophen, docusate sodium, ondansetron (ZOFRAN) IV, traMADol  Assessment/Plan:  1.  Chronic Afib, presently on PO amiodarone and  also beta blocker and CCB. Heart rate continues to be uncontrolled. CHA2DS2VAsc score 4. Stroke risk 4% 2.CAD s/p LAD PCI 04/2017: stenting of the proximal LAD 04/24/2017: CSI atherectomy followed by DES stenting with 3.0 x 26 mm resolute Onyx followed by postdilatation with a 3.5 x 15 mm noncompliant balloon at 16 atm of pressure. 4. Abdominal aortic aneurysm: CT of the abdomen 11/29/2016: Heterogeneous plaque, fusiform abdominal aortic aneurysm distal to the origin of IMA, 4.8 cm, enlarged compared to 4.4 cm from 04/26/2015. Circumferential adherent mural thrombus. 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 Mild stenosis with calcification left renal artery. High-grade stenosis at the origin of  the right IIA, right external iliac artery high-grade stenosis. Severe calcification of the femoral arteries. Coronary calcification. Abdominal aortic duplex 08/23/2016: Moderate dilatation of the abdominal aorta is noted in the mid and distal aorta measuring 4.11 x 4.18 x 4.44 cm is seen. Diffuse plaque noted in the proximal aorta. Focal plaque noted in the mid and distal aorta. Compared to the study done on 02/15/2016, no significant change.  5. Hypertension with stage III chronic kidney disease, serum creatinine stable and in fact improved today. 6. Tobacco abuse COPD patient still smokes 1 or 2 cigarettes at home. 7. Severe malnutrition 8. Failure to thrive in an adult  Recommendation: Patient had A. fib with RVR, hence increase dilt to 60 mg TID and also add digoxin for rate control. BP is stable and no angina. CBC stable.   No clinical evidence of congestive heart failure.  Needs continued physical therapy.  With regard to CAD, I have discontinued Plavix, I will continue Xarelto for A. Fib   Hemoglobin has remained stable. Will increase Xarelto to 20 mg in view of normal eGFR.   Adrian Prows, M.D. 06/25/2017, 9:03 AM [facility] Pager: [phone] Office: [phone] If no answer: [phone]

## 2017-06-25 NOTE — Care Management Note (Signed)
Case Management Note  Patient Details  Name: Jeremy Macdonald MRN: [id number] Date of Birth: [birth year]  Subjective/Objective: Pt presented for Atrial Fib. PTA from home with wife. Plan will be for d/c to CIR.                  Action/Plan: Rehab Admission Coordinator assisting patient with disposition needs.   Expected Discharge Date:  06/25/17               Expected Discharge Plan:  [place]  In-House Referral:  NA  Discharge planning Services  CM Consult  Post Acute Care Choice:  NA Choice offered to:  NA  DME Arranged:  N/A DME Agency:  NA  HH Arranged:  NA HH Agency:  NA  Status of Service:  Completed, signed off  If discussed at [place] of Stay Meetings, dates discussed:    Additional Comments:  Bethena Roys, RN 06/25/2017, 2:09 PM

## 2017-06-25 NOTE — Progress Notes (Signed)
I met with pt and his wife at bedside. We discussed goals and expectations of an inpt rehab admit. They are in agreement. I contacted Dr. Alfredia Ferguson and we will admit pt to inpt rehab today. RN CM and SW made aware. [phone]

## 2017-06-25 NOTE — Progress Notes (Signed)
Physical Therapy Treatment Patient Details Name: Jeremy Macdonald MRN: [id number] DOB: 1925-11-16 Today's Date: 06/25/2017    History of Present Illness Pt is an 84 y/o male admitted secondary to fatigue dizziness. Pt also with fall at home which resulted in nasal bone fracture. CT of the spine negative for acute abnormality. CT of the head showed diffuse atrophy with extensive small vessel disease. PMH includes Dementia, a fib, CAD s/p stent placement, CKD, COPD, HTN, AAA.     PT Comments    Pt improving activity tolerance and was able to perform gait training with min A with RW today with HR <100 bpm.  Discussed CIR with pt/wife and both seemed open and excited about the possibility of rehab at the hospital vs SNF.     Follow Up Recommendations  CIR     Equipment Recommendations  Rolling walker with 5" wheels;3in1 (PT)    Recommendations for Other Services Rehab consult     Precautions / Restrictions Precautions Precautions: Fall Restrictions Weight Bearing Restrictions: No    Mobility  Bed Mobility     Rolling: Min assist Sidelying to sit: Min assist       General bed mobility comments: min A for trunk and LEs, guarded due to neck pain  Transfers Overall transfer level: Needs assistance Equipment used: Rolling walker (2 wheeled) Transfers: Sit to/from Stand Sit to Stand: Min assist         General transfer comment: cues for hand placement, min A to stand, HR 94 bpm with standing  Ambulation/Gait Ambulation/Gait assistance: Min assist Ambulation Distance (Feet): 50 Feet Assistive device: Rolling walker (2 wheeled)       General Gait Details: min A for steadying assist, cues for safety with RW, pt requires 2 x standing rest break during gait   Stairs            Wheelchair Mobility    Modified Rankin (Stroke Patients Only)       Balance               Standing balance comment: static standing balance with close supervision with RW                             Cognition Arousal/Alertness: Awake/alert Behavior During Therapy: WFL for tasks assessed/performed Overall Cognitive Status: History of cognitive impairments - at baseline                                 General Comments: History of dementia      Exercises      General Comments        Pertinent Vitals/Pain Faces Pain Scale: Hurts little more Pain Location: neck  Pain Descriptors / Indicators: Grimacing;Guarding Pain Intervention(s): Repositioned;Monitored during session;Limited activity within patient's tolerance    Home Living                      Prior Function            PT Goals (current goals can now be found in the care plan section) Progress towards PT goals: Progressing toward goals    Frequency    Min 3X/week      PT Plan Discharge plan needs to be updated;Frequency needs to be updated    Co-evaluation              AM-PAC PT "6 Clicks"  Daily Activity  Outcome Measure  Difficulty turning over in bed (including adjusting bedclothes, sheets and blankets)?: A Little Difficulty moving from lying on back to sitting on the side of the bed? : A Little Difficulty sitting down on and standing up from a chair with arms (e.g., wheelchair, bedside commode, etc,.)?: A Lot Help needed moving to and from a bed to chair (including a wheelchair)?: A Lot Help needed walking in hospital room?: A Lot Help needed climbing 3-5 steps with a railing? : Total 6 Click Score: 13    End of Session Equipment Utilized During Treatment: Gait belt Activity Tolerance: Patient tolerated treatment well Patient left: in chair;with call bell/phone within reach;with family/visitor present Nurse Communication: Mobility status PT Visit Diagnosis: History of falling (Z91.81);Unsteadiness on feet (R26.81);Muscle weakness (generalized) (M62.81)     Time: 8270-7867 PT Time Calculation (min) (ACUTE ONLY): 24  min  Charges:  $Gait Training: 8-22 mins $Therapeutic Activity: 8-22 mins                    G Codes:       Isabelle Course, PT, DPT   Kaylany Tesoriero 06/25/2017, 9:31 AM
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
## 2017-06-25 NOTE — NC FL2 (Signed)
[place] LEVEL OF CARE SCREENING TOOL     IDENTIFICATION  Patient Name: Jeremy Macdonald Birthdate: 20-Mar-1926 Sex: male Admission Date (Current Location): 06/20/2017  [facility] [place] Number:  Herbalist and Address:  [facility] [address], [place], [place] [address]      Provider Number: [id number]  Attending Physician Name and Address:  Kerney Elbe, DO  Relative Name and Phone Number:  Ziare Cryder, spouse, [phone]    Current Level of Care: Hospital Recommended Level of Care: [place] Prior Approval Number:    Date Approved/Denied:   PASRR Number: [id number]  Discharge Plan: SNF    Current Diagnoses: Patient Active Problem List   Diagnosis Date Noted  . Malnutrition of moderate degree 06/24/2017  . Dementia 06/21/2017  . Hyperglycemia 06/21/2017  . BPH (benign prostatic hyperplasia) 06/21/2017  . Dizziness 06/21/2017  . Atrial fibrillation with rapid ventricular response ([place]) 06/20/2017  . Atrial fibrillation ([place]) 06/20/2017  . Post PTCA 04/24/2017  . CAD (coronary artery disease), native coronary artery 04/22/2017  . Angina pectoris ([place]) 04/22/2017  . PAF (paroxysmal atrial fibrillation) ([place]) 03/18/2017  . Abnormal nuclear stress test 03/18/2017    Orientation RESPIRATION BLADDER Height & Weight     Self, Time, Situation, Place  Normal Continent Weight: 172 lb 9.6 oz (78.3 kg) Height:  5\' 9"  (175.3 cm)  BEHAVIORAL SYMPTOMS/MOOD NEUROLOGICAL BOWEL NUTRITION STATUS      Continent Diet(please see DC summary)  AMBULATORY STATUS COMMUNICATION OF NEEDS Skin   Extensive Assist Verbally Normal                       Personal Care Assistance Level of Assistance  Bathing, Feeding, Dressing Bathing Assistance: Maximum assistance Feeding assistance: Limited assistance Dressing Assistance: Maximum assistance     Functional Limitations Info  Sight, Hearing,  Speech Sight Info: Impaired Hearing Info: Impaired Speech Info: Adequate    SPECIAL CARE FACTORS FREQUENCY  PT (By licensed PT), OT (By licensed OT)     PT Frequency: 5x/week OT Frequency: 5x/week            Contractures Contractures Info: Not present    Additional Factors Info  Code Status, Allergies Code Status Info: Full Allergies Info: Phenergan Promethazine Hcl           Current Medications (06/25/2017):  This is the current hospital active medication list Current Facility-Administered Medications  Medication Dose Route Frequency Provider Last Rate Last Dose  . acetaminophen (TYLENOL) tablet 650 mg  650 mg Oral Q4H PRN Patwardhan, Manish J, MD      . amiodarone (PACERONE) tablet 200 mg  200 mg Oral TID Adrian Prows, MD   200 mg at 06/24/17 2219  . atorvastatin (LIPITOR) tablet 10 mg  10 mg Oral Q2000 Patwardhan, Manish J, MD   10 mg at 06/24/17 2217  . digoxin (LANOXIN) tablet 0.125 mg  0.125 mg Oral Daily Adrian Prows, MD      . diltiazem (CARDIZEM) tablet 60 mg  60 mg Oral Q8H Adrian Prows, MD      . docusate sodium (COLACE) capsule 100 mg  100 mg Oral BID PRN Patwardhan, Manish J, MD      . dutasteride (AVODART) capsule 0.5 mg  0.5 mg Oral Daily Patwardhan, Manish J, MD   0.5 mg at 06/24/17 0948   And  . tamsulosin (FLOMAX) capsule 0.4 mg  0.4 mg Oral Daily Patwardhan, Manish J,  MD   0.4 mg at 06/24/17 0948  . feeding supplement (ENSURE ENLIVE) (ENSURE ENLIVE) liquid 237 mL  237 mL Oral BID BM Sheikh, Omair Latif, DO   237 mL at 06/24/17 1428  . metoprolol tartrate (LOPRESSOR) tablet 50 mg  50 mg Oral BID Patwardhan, Manish J, MD   50 mg at 06/24/17 2220  . multivitamin with minerals tablet 1 tablet  1 tablet Oral Daily Raiford Noble [place], [place]   1 tablet at 06/24/17 4315  . ondansetron (ZOFRAN) injection 4 mg  4 mg Intravenous Q6H PRN Patwardhan, Manish J, MD      . polyethylene glycol (MIRALAX / GLYCOLAX) packet 17 g  17 g Oral Daily Patwardhan, Manish J, MD   17 g at  06/24/17 0952  . traMADol (ULTRAM) tablet 50 mg  50 mg Oral Q6H PRN Nigel Mormon, MD   50 mg at 06/24/17 2220     Discharge Medications: Please see discharge summary for a list of discharge medications.  Relevant Imaging Results:  Relevant Lab Results:   Additional Information SSN: [id number]  Estanislado Emms, LCSW

## 2017-06-25 NOTE — Discharge Summary (Signed)
Physician Discharge Summary  Jeremy Macdonald RKY:[id number] DOB: April 20, 1926 DOA: 06/20/2017  PCP: Vernie Shanks, MD  Admit date: 06/20/2017 Discharge date: 06/25/2017  Admitted From: Home Disposition:  CIR  Recommendations for Outpatient Follow-up:  1. Follow up with PCP in 1-2 weeks 2. Follow up with Cardiology Dr. Einar Gip within 1 week 3. Follow up with Urology Dr. Alyson Ingles for outpatient follow up for TOV and Hematuria 4. Please obtain BMP/CBC in one week 5. Please follow up on the following pending results:  Home Health: No Equipment/Devices: Conservation officer, nature with 5" Wheels; 3in1   Discharge Condition: Stable  CODE STATUS: FULL CODE Diet recommendation: Diet 2 gram Sodium   Brief/Interim Summary: Jeremy H Dickersonis an 84 y.o.malewith h/o HTN; HLD; CAD; COPD; and afib. He was admitted by the Cardiology service yesterday for Atrial Fib with RVR after suffering a fall on 1/13 with nasal fracture and multiple ecchymoses. TRH was consulted regarding dizziness and generalized weakness.   He has had trouble walking. This time, he was outside and he tripped over a curb and hit his head. He was confused and so not a great historianbut reportedthat he felt dizzy but none today. He has been a bit more dizzy in the last 6 months. His wife notes that thisdizzinesshas been ongoing since he had the stent placed but she doesn't think that is related to his dizziness. 5ya he developed urinary retention; Infrequent incontinence. They feel like the Langley Gauss has worked very well for his BPH. He has had some dizziness while in the hospital. Dizziness is only when he is up. The dizziness has improved with rate control. He did have urinary retention last night requiring a foley. The urinary retention resulted in obstructive uropathy and acute renal failure; he is now again making urine albeit blood-tinged after traumatic foley insertion. Urology has also been asked to consult on the  patient.   We were asked to take over for Cardiology as primary and will do so as he has other medical issues and for Placement. Cardiology continued to follow the patient and appreciate their help in managing patient's HR. Patient has improved and now is just weak. Hematuria has been improving  CIR was consulted and patient a good Candidate. Deemed medically stable to D/C for Rehab and will need to follow up with PCP, Cardiology, and Urology.   Discharge Diagnoses:  Principal Problem:   Atrial fibrillation with rapid ventricular response (HCC) Active Problems:   Dementia   Hyperglycemia   BPH (benign prostatic hyperplasia)   Dizziness   Malnutrition of moderate degree   Chronic obstructive pulmonary disease (HCC)   Bilateral hearing loss   Leukocytosis   Gross hematuria   Acute blood loss anemia   Hypoalbuminemia  Atiral Fibrillation with RVR, improving  -This issue is being cared for by the Cardiology Team -He appears to still be on both Amiodarone and Cardizem; Was given more Diltiazem 30 mg q8h and Amiodarone 200 mg po TID -Cardiology added po Digoxin 0.125 mg Daily and will need Level checked in a week  -He has been taking Xarelto as an outpatient -With his recurrent falls and dizziness, would suggest consideration of risk:benefit from Xarelto and consider discontinuation of AC at some point in the near future but for now will keep on Xarelto -Cardiology D/C'd Plavix to reduce bleeding risk even though patient had recent stenting   -PT/OT consulted and recommending CIR  Dizziness/Generalized Weakeness -With ataxia, dementia, and some incontinence, NPH was entertained - particularly since this  may be a way to improve the dementia -Patient discussed with Dr. Cheral Marker and CT reviewed. CT does not appear to be consistent with NPH and instead appears to show diffuse atrophy.  -Postural hypotension and dizziness are both commonly associated with dementia. -Will reorder  Orthostatic BP's; Will also consider TED Hose -PT/OT Recommending SNF;Asked them to re-evaluate for CIR and patient accepted.  -Education officer, museum was looking for SNF but will D/C to CIR now that patient has a bed offered and is stable to D/C   Dementia -By exam, the patient does appear to have mild to moderate dementia which is likely to benefit from further outpatient consideration.  -Suspect that he would benefit from medication, although his wife is upset by this possible diagnosis since her sister also had dementia -Will need ongoing support and close outpatient f/u with Neurology and may consider getting MRI as an outpatient   BPH -Patient with known BPH -Has had good response to Faxon an apparent obstructive uropathy necessitating foley placement -Acute kidney injury developed as a result but improved -Foley is draining and has some blood tinged -C/w Avodart and Flomax -Would expect resolution of AKI with obstruction resolution -Urology saw recommending continuing Foley and if worse blood then get CBI -Will attempt TOV as an outpatient and will need to see Dr. Alyson Ingles as an outpatient    Gross Hematuria -See above -Per Urology c/w Foley -Slightly improving color -Follow up with Dr. Alyson Ingles as an outpatient and keep foley in for now   Hyperglycemia -May be stress response -Will follow with fasting AM labs -It is unlikely that he will need acute or chronic treatment for this issue -Follow up PCP as an outpatient   Hypokalemia -Repleted and improved to 4.5 -Repeat CMP in CIR  CAD s/p PCI -Recent stent placement in 11/18 -Cardiology discontinued Plavix due to risk of bleeding -C/w Atorvastatin and Metoprolol  -Follow up with Cardiology closely; Talked with Dr. Einar Gip and he will see patient in Rehab if necessary   Hx of COPD -Currently not in Exacerbation  Hypomagnesemia -Mag Level was 1.5 yesterday and improved to 1.7 -Continue to Monitor and Replete  as Necessary -Repeat Mag Level in AM  Hypertension  -Held Home Benazepril-HCTZ -C/w Cardiac Regimen per Cardiology -Continue to adjust BP Meds as necessary   Moderate Malnutrition in the Context of Chronic illness -Nutritionist consulted -C/w Ensure Enlive po BID, MVI, Magic Cup TID, and Liberalizing diet  Hyperbilirubinemia -Mild at 1.3 and went to 1.7 -T Bili now 1.6 -Continue to Monitor and follow up [place]  Discharge Instructions  Discharge Instructions    Call MD for:  difficulty breathing, headache or visual disturbances   Complete by:  As directed    Call MD for:  extreme fatigue   Complete by:  As directed    Call MD for:  hives   Complete by:  As directed    Call MD for:  persistant dizziness or light-headedness   Complete by:  As directed    Call MD for:  persistant nausea and vomiting   Complete by:  As directed    Call MD for:  redness, tenderness, or signs of infection (pain, swelling, redness, odor or green/yellow discharge around incision site)   Complete by:  As directed    Call MD for:  severe uncontrolled pain   Complete by:  As directed    Call MD for:  temperature >100.4   Complete by:  As directed    Diet -  low sodium heart healthy   Complete by:  As directed    Discharge instructions   Complete by:  As directed    Follow up Care in Rehab. Will need to follow up with Urology for TOV and Removal of foley as an outpatient. Continue to Monitor Hematuria.   Increase activity slowly   Complete by:  As directed      Allergies as of 06/25/2017      Reactions   Phenergan [promethazine Hcl] Other (See Comments)   Confusion       Medication List    STOP taking these medications   benazepril-hydrochlorthiazide 20-25 MG tablet Commonly known as:  LOTENSIN HCT   cephALEXin 500 MG capsule Commonly known as:  KEFLEX   clopidogrel 75 MG tablet Commonly known as:  PLAVIX     TAKE these medications   acetaminophen 325 MG tablet Commonly known as:   TYLENOL Take 325 mg daily as needed by mouth for moderate pain.   amiodarone 200 MG tablet Commonly known as:  PACERONE Take 1 tablet (200 mg total) by mouth 3 (three) times daily.   atorvastatin 10 MG tablet Commonly known as:  LIPITOR Take 10 mg by mouth daily.   digoxin 0.125 MG tablet Commonly known as:  LANOXIN Take 1 tablet (0.125 mg total) by mouth daily. Start taking on:  06/26/2017   diltiazem 60 MG tablet Commonly known as:  CARDIZEM Take 1 tablet (60 mg total) by mouth every 8 (eight) hours.   docusate sodium 100 MG capsule Commonly known as:  COLACE Take 1 capsule (100 mg total) by mouth 2 (two) times daily as needed for mild constipation.   feeding supplement (ENSURE ENLIVE) Liqd Take 237 mLs by mouth 2 (two) times daily between meals.   JALYN 0.5-0.4 MG Caps Generic drug:  Dutasteride-Tamsulosin HCl Take 1 tablet by mouth daily.   metoprolol tartrate 50 MG tablet Commonly known as:  LOPRESSOR Take 1 tablet (50 mg total) by mouth 2 (two) times daily. What changed:    medication strength  when to take this   polyethylene glycol packet Commonly known as:  MIRALAX / GLYCOLAX Take 17 g by mouth daily. Start taking on:  06/26/2017   Rivaroxaban 15 MG Tabs tablet Commonly known as:  XARELTO Take 1 tablet (15 mg total) by mouth daily with supper. Start this evening   traMADol 50 MG tablet Commonly known as:  ULTRAM Take 1 tablet (50 mg total) by mouth every 6 (six) hours as needed.      Follow-up Information    McKenzie, Candee Furbish, MD Follow up in 1 week(s).   Specialty:  Urology Contact information: [address] [place]  [address] [phone]          Allergies  Allergen Reactions  . Phenergan [Promethazine Hcl] Other (See Comments)    Confusion    Consultations:  Cardiology Dr. Einar Gip  Urology  Procedures/Studies: Dg Chest 1 View  Result Date: 06/20/2017 CLINICAL DATA:  Pain following fall EXAM: CHEST 1 VIEW COMPARISON:   March 08, 2017 FINDINGS: There is scarring in the right lower lobe region. There is no edema or consolidation. Heart is upper normal in size with pulmonary vascularity within normal limits. There is aortic atherosclerosis. No adenopathy. No pneumothorax. No bone lesions. IMPRESSION: Scarring right lower lobe. No edema or consolidation. Stable cardiac silhouette. There is aortic atherosclerosis. Aortic Atherosclerosis (ICD10-I70.0). Electronically Signed   By: Lowella Grip III M.D.   On: 06/20/2017 11:32  Ct Head Wo Contrast  Result Date: 06/20/2017 CLINICAL DATA:  Dizziness and pain following recent fall EXAM: CT HEAD WITHOUT CONTRAST CT CERVICAL SPINE WITHOUT CONTRAST TECHNIQUE: Multidetector CT imaging of the head and cervical spine was performed following the standard protocol without intravenous contrast. Multiplanar CT image reconstructions of the cervical spine were also generated. COMPARISON:  CT head and CT cervical spine June 17, 2017 FINDINGS: CT HEAD FINDINGS Brain: There is persistent diffuse cerebral atrophy. There is no demonstrable mass, hemorrhage, extra-axial fluid collection, or midline shift. Widespread small vessel disease throughout the centra semiovale is stable. Small vessel disease is noted in each external capsule anteriorly. No acute infarct appreciable. Vascular: No hyperdense vessel. There is calcification in each carotid siphon and distal vertebral artery region. Skull: Bony calvarium appears intact. Fractures of the anterior left and right nasal bones remain stable compared to recent CT. No new fracture evident. Sinuses/Orbits: There is mucosal thickening in several ethmoid air cells bilaterally. There is a retention cyst in the inferior right maxillary antrum. There is a small retention cyst in the anterior left maxillary antrum. Orbits appear symmetric bilaterally. Other: Mastoid air cells are clear. CT CERVICAL SPINE FINDINGS Alignment: There is stable 2 mm  retrolisthesis of C3 on C4. No new spondylolisthesis. Skull base and vertebrae: Skull base and craniocervical junction regions appear normal. There is mild pannus posterior to the odontoid which is not causing impression on the craniocervical junction. There is no demonstrable fracture. No blastic or lytic bone lesions. Soft tissues and spinal canal: Prevertebral soft tissues and predental space regions are normal. No paraspinous lesions are evident. There is no cord or canal hematoma. Disc levels: There is moderately severe disc space narrowing at C3-4. There is mild disc space narrowing at C5-6. There is facet hypertrophy at several levels without appreciable nerve root edema or effacement. No disc extrusion or stenosis. Upper chest: There is mild scarring in the lung apices. No upper lung zone edema or consolidation. Other: There is atherosclerotic calcification in both subclavian arteries as well as in both carotid and vertebral arteries. Calcification appears fairly extensive in the extracranial carotid arteries. IMPRESSION: CT head: Diffuse atrophy with extensive supratentorial small vessel disease, unchanged compared to recent study. No mass, hemorrhage, or extra-axial fluid collection. Foci of arterial vascular calcification noted. Nasal bone fracture stable compared to recent study. Areas of paranasal sinus disease noted. CT cervical spine: No fracture. Mild spondylolisthesis at C3-4 is felt to be due to underlying spondylosis. There is osteoarthritic changes several levels. There is extensive vascular calcification, most notably in the extracranial carotid arteries bilaterally. Electronically Signed   By: Lowella Grip III M.D.   On: 06/20/2017 11:04   Ct Head Wo Contrast  Result Date: 06/17/2017 CLINICAL DATA:  [place] today and struck head and face. EXAM: CT HEAD WITHOUT CONTRAST CT MAXILLOFACIAL WITHOUT CONTRAST CT CERVICAL SPINE WITHOUT CONTRAST TECHNIQUE: Multidetector CT imaging of the head,  cervical spine, and maxillofacial structures were performed using the standard protocol without intravenous contrast. Multiplanar CT image reconstructions of the cervical spine and maxillofacial structures were also generated. COMPARISON:  08/15/2009 head CT FINDINGS: CT HEAD FINDINGS Brain: Progressive age related cerebral atrophy, ventriculomegaly and periventricular white matter disease. No extra-axial fluid collections are identified. No CT findings for acute hemispheric infarction or intracranial hemorrhage. No mass lesions. The brainstem and cerebellum are normal. Vascular: No hyperdense vessels or aneurysm. Stable vascular calcifications. Skull: No acute skull fracture or bone lesion. Other: No scalp laceration or hematoma. CT  MAXILLOFACIAL FINDINGS Osseous: Small fractures involving the left nasal bone and bony is a bridge. There also multiple lacerations and radiopaque foreign bodies in the soft tissues overlying the nose. The bony nasal septum is intact. No other acute facial bone fractures are identified. The mandibular condyles are normally located. No mandible fracture. Torus mandibularis noted incidentally. Orbits: The orbital bones are intact.  The globes are intact. Sinuses: Mucous retention cysts or possible polyps in the maxillary sinuses. The other paranasal sinuses are clear and the mastoid air cells and middle ear cavities are clear. Soft tissues: Soft tissue swelling and lacerations involving the nose. CT CERVICAL SPINE FINDINGS Alignment: Normal Skull base and vertebrae: No acute fracture. Soft tissues and spinal canal: No prevertebral fluid or swelling. No visible canal hematoma. Disc levels: No significant disc protrusions, spinal or foraminal stenosis. Upper chest: The lung apices are grossly clear. Other: Extensive carotid artery calcifications. IMPRESSION: 1. Progressive cerebral atrophy, ventriculomegaly and periventricular white matter disease. 2. No new/acute intracranial findings or  skull fracture. 3. Small nasal bone fractures and multiple lacerations and small radiopaque foreign bodies involving the soft tissues of the nose. 4. No acute cervical spine fracture. Electronically Signed   By: Marijo Sanes M.D.   On: 06/17/2017 18:53   Ct Cervical Spine Wo Contrast  Result Date: 06/20/2017 CLINICAL DATA:  Dizziness and pain following recent fall EXAM: CT HEAD WITHOUT CONTRAST CT CERVICAL SPINE WITHOUT CONTRAST TECHNIQUE: Multidetector CT imaging of the head and cervical spine was performed following the standard protocol without intravenous contrast. Multiplanar CT image reconstructions of the cervical spine were also generated. COMPARISON:  CT head and CT cervical spine June 17, 2017 FINDINGS: CT HEAD FINDINGS Brain: There is persistent diffuse cerebral atrophy. There is no demonstrable mass, hemorrhage, extra-axial fluid collection, or midline shift. Widespread small vessel disease throughout the centra semiovale is stable. Small vessel disease is noted in each external capsule anteriorly. No acute infarct appreciable. Vascular: No hyperdense vessel. There is calcification in each carotid siphon and distal vertebral artery region. Skull: Bony calvarium appears intact. Fractures of the anterior left and right nasal bones remain stable compared to recent CT. No new fracture evident. Sinuses/Orbits: There is mucosal thickening in several ethmoid air cells bilaterally. There is a retention cyst in the inferior right maxillary antrum. There is a small retention cyst in the anterior left maxillary antrum. Orbits appear symmetric bilaterally. Other: Mastoid air cells are clear. CT CERVICAL SPINE FINDINGS Alignment: There is stable 2 mm retrolisthesis of C3 on C4. No new spondylolisthesis. Skull base and vertebrae: Skull base and craniocervical junction regions appear normal. There is mild pannus posterior to the odontoid which is not causing impression on the craniocervical junction. There is  no demonstrable fracture. No blastic or lytic bone lesions. Soft tissues and spinal canal: Prevertebral soft tissues and predental space regions are normal. No paraspinous lesions are evident. There is no cord or canal hematoma. Disc levels: There is moderately severe disc space narrowing at C3-4. There is mild disc space narrowing at C5-6. There is facet hypertrophy at several levels without appreciable nerve root edema or effacement. No disc extrusion or stenosis. Upper chest: There is mild scarring in the lung apices. No upper lung zone edema or consolidation. Other: There is atherosclerotic calcification in both subclavian arteries as well as in both carotid and vertebral arteries. Calcification appears fairly extensive in the extracranial carotid arteries. IMPRESSION: CT head: Diffuse atrophy with extensive supratentorial small vessel disease, unchanged compared to recent  study. No mass, hemorrhage, or extra-axial fluid collection. Foci of arterial vascular calcification noted. Nasal bone fracture stable compared to recent study. Areas of paranasal sinus disease noted. CT cervical spine: No fracture. Mild spondylolisthesis at C3-4 is felt to be due to underlying spondylosis. There is osteoarthritic changes several levels. There is extensive vascular calcification, most notably in the extracranial carotid arteries bilaterally. Electronically Signed   By: Lowella Grip III M.D.   On: 06/20/2017 11:04   Ct Cervical Spine Wo Contrast  Result Date: 06/17/2017 CLINICAL DATA:  [place] today and struck head and face. EXAM: CT HEAD WITHOUT CONTRAST CT MAXILLOFACIAL WITHOUT CONTRAST CT CERVICAL SPINE WITHOUT CONTRAST TECHNIQUE: Multidetector CT imaging of the head, cervical spine, and maxillofacial structures were performed using the standard protocol without intravenous contrast. Multiplanar CT image reconstructions of the cervical spine and maxillofacial structures were also generated. COMPARISON:  08/15/2009 head  CT FINDINGS: CT HEAD FINDINGS Brain: Progressive age related cerebral atrophy, ventriculomegaly and periventricular white matter disease. No extra-axial fluid collections are identified. No CT findings for acute hemispheric infarction or intracranial hemorrhage. No mass lesions. The brainstem and cerebellum are normal. Vascular: No hyperdense vessels or aneurysm. Stable vascular calcifications. Skull: No acute skull fracture or bone lesion. Other: No scalp laceration or hematoma. CT MAXILLOFACIAL FINDINGS Osseous: Small fractures involving the left nasal bone and bony is a bridge. There also multiple lacerations and radiopaque foreign bodies in the soft tissues overlying the nose. The bony nasal septum is intact. No other acute facial bone fractures are identified. The mandibular condyles are normally located. No mandible fracture. Torus mandibularis noted incidentally. Orbits: The orbital bones are intact.  The globes are intact. Sinuses: Mucous retention cysts or possible polyps in the maxillary sinuses. The other paranasal sinuses are clear and the mastoid air cells and middle ear cavities are clear. Soft tissues: Soft tissue swelling and lacerations involving the nose. CT CERVICAL SPINE FINDINGS Alignment: Normal Skull base and vertebrae: No acute fracture. Soft tissues and spinal canal: No prevertebral fluid or swelling. No visible canal hematoma. Disc levels: No significant disc protrusions, spinal or foraminal stenosis. Upper chest: The lung apices are grossly clear. Other: Extensive carotid artery calcifications. IMPRESSION: 1. Progressive cerebral atrophy, ventriculomegaly and periventricular white matter disease. 2. No new/acute intracranial findings or skull fracture. 3. Small nasal bone fractures and multiple lacerations and small radiopaque foreign bodies involving the soft tissues of the nose. 4. No acute cervical spine fracture. Electronically Signed   By: Marijo Sanes M.D.   On: 06/17/2017 18:53    US Renal  Result Date: 06/21/2017 CLINICAL DATA:  Oliguria. EXAM: RENAL / URINARY TRACT ULTRASOUND COMPLETE COMPARISON:  None. FINDINGS: Right Kidney: Length: 9.8 cm. Echogenicity is within normal limits. No mass or hydronephrosis visualized. Left Kidney: Length: 10.9 cm. Echogenicity within normal limits. No hydronephrosis visualized. 2.5 cm simple cyst is seen in lower pole. Bladder: Decompressed secondary to Foley catheter. Severely enlarged prostate gland is again noted, measuring 6.8 x 6.2 x 4.9 cm. IMPRESSION: No hydronephrosis or renal obstruction is noted. Severely enlarged prostate gland is again noted as described on prior CT scan. Electronically Signed   By: Marijo Conception, M.D.   On: 06/21/2017 09:24   Dg Knee Complete 4 Views Left  Result Date: 06/20/2017 CLINICAL DATA:  Fall.  Anterior knee pain, left greater than right. EXAM: LEFT KNEE - COMPLETE 4+ VIEW; RIGHT KNEE - COMPLETE 4+ VIEW COMPARISON:  None. FINDINGS: Mild degenerative changes are most evident in the  medial and femoral compartments bilaterally. A small right-sided joint effusion is present. Chondrocalcinosis is present in the menisci bilaterally. Vascular calcifications are present. The joints are located bilaterally. No acute osseous abnormalities are present. IMPRESSION: 1. Bilateral chondrocalcinosis compatible with CPPD arthropathy. 2. Mild osseous degenerative changes in the medial and patellofemoral compartments bilaterally. 3. No acute osseous abnormality. 4. Extensive vascular calcifications.  Atherosclerosis. Electronically Signed   By: San Morelle M.D.   On: 06/20/2017 11:34   Dg Knee Complete 4 Views Right  Result Date: 06/20/2017 CLINICAL DATA:  Fall.  Anterior knee pain, left greater than right. EXAM: LEFT KNEE - COMPLETE 4+ VIEW; RIGHT KNEE - COMPLETE 4+ VIEW COMPARISON:  None. FINDINGS: Mild degenerative changes are most evident in the medial and femoral compartments bilaterally. A small right-sided  joint effusion is present. Chondrocalcinosis is present in the menisci bilaterally. Vascular calcifications are present. The joints are located bilaterally. No acute osseous abnormalities are present. IMPRESSION: 1. Bilateral chondrocalcinosis compatible with CPPD arthropathy. 2. Mild osseous degenerative changes in the medial and patellofemoral compartments bilaterally. 3. No acute osseous abnormality. 4. Extensive vascular calcifications.  Atherosclerosis. Electronically Signed   By: San Morelle M.D.   On: 06/20/2017 11:34   Dg Hand Complete Right  Result Date: 06/17/2017 CLINICAL DATA:  Posterior right hand laceration following a fall on concrete today. EXAM: RIGHT HAND - COMPLETE 3+ VIEW COMPARISON:  Right wrist dated 06/08/2017 and right hand dated 05/26/2013. FINDINGS: Degenerative changes at the articulation of the trapezium and trapezoid with the scaphoid. No fracture or dislocation seen. IMPRESSION: 1. No fracture. 2. Stable degenerative changes at the articulation of the trapezium and trapezoid with the scaphoid. Electronically Signed   By: Claudie Revering M.D.   On: 06/17/2017 18:24   Ct Maxillofacial Wo Contrast  Result Date: 06/17/2017 CLINICAL DATA:  [place] today and struck head and face. EXAM: CT HEAD WITHOUT CONTRAST CT MAXILLOFACIAL WITHOUT CONTRAST CT CERVICAL SPINE WITHOUT CONTRAST TECHNIQUE: Multidetector CT imaging of the head, cervical spine, and maxillofacial structures were performed using the standard protocol without intravenous contrast. Multiplanar CT image reconstructions of the cervical spine and maxillofacial structures were also generated. COMPARISON:  08/15/2009 head CT FINDINGS: CT HEAD FINDINGS Brain: Progressive age related cerebral atrophy, ventriculomegaly and periventricular white matter disease. No extra-axial fluid collections are identified. No CT findings for acute hemispheric infarction or intracranial hemorrhage. No mass lesions. The brainstem and cerebellum  are normal. Vascular: No hyperdense vessels or aneurysm. Stable vascular calcifications. Skull: No acute skull fracture or bone lesion. Other: No scalp laceration or hematoma. CT MAXILLOFACIAL FINDINGS Osseous: Small fractures involving the left nasal bone and bony is a bridge. There also multiple lacerations and radiopaque foreign bodies in the soft tissues overlying the nose. The bony nasal septum is intact. No other acute facial bone fractures are identified. The mandibular condyles are normally located. No mandible fracture. Torus mandibularis noted incidentally. Orbits: The orbital bones are intact.  The globes are intact. Sinuses: Mucous retention cysts or possible polyps in the maxillary sinuses. The other paranasal sinuses are clear and the mastoid air cells and middle ear cavities are clear. Soft tissues: Soft tissue swelling and lacerations involving the nose. CT CERVICAL SPINE FINDINGS Alignment: Normal Skull base and vertebrae: No acute fracture. Soft tissues and spinal canal: No prevertebral fluid or swelling. No visible canal hematoma. Disc levels: No significant disc protrusions, spinal or foraminal stenosis. Upper chest: The lung apices are grossly clear. Other: Extensive carotid artery calcifications. IMPRESSION: 1. Progressive  cerebral atrophy, ventriculomegaly and periventricular white matter disease. 2. No new/acute intracranial findings or skull fracture. 3. Small nasal bone fractures and multiple lacerations and small radiopaque foreign bodies involving the soft tissues of the nose. 4. No acute cervical spine fracture. Electronically Signed   By: Marijo Sanes M.D.   On: 06/17/2017 18:53    Subjective: Seen and examined and denied any complaints except just feeling weak. No nausea or vomiting. Hematuria improving. No other complaints or concerns and ready to go to rehab.  Discharge Exam: Vitals:   06/25/17 0927 06/25/17 1003  BP:  130/76  Pulse:  (!) 110  Resp:    Temp:    SpO2: 92%     Vitals:   06/25/17 0439 06/25/17 0601 06/25/17 0927 06/25/17 1003  BP: 136/75 113/79  130/76  Pulse: 90   (!) 110  Resp: 12     Temp: (!) 97.4 F (36.3 C)     TempSrc: Oral     SpO2: 96%  92%   Weight: 78.3 kg (172 lb 9.6 oz)     Height:       General: Pt is alert, awake, not in acute distress; Significant face and neck bruising Cardiovascular: Irregularly irregular, S1/S2 +, no rubs, no gallops Respiratory: CTA bilaterally, no wheezing, no rhonchi Abdominal: Soft, NT, ND, bowel sounds + Extremities: no edema, no cyanosis GU: foley catheter with Hematuria improving   The results of significant diagnostics from this hospitalization (including imaging, microbiology, ancillary and laboratory) are listed below for reference.    Microbiology: Recent Results (from the past 240 hour(s))  MRSA PCR Screening     Status: None   Collection Time: 06/20/17  5:46 PM  Result Value Ref Range Status   MRSA by PCR NEGATIVE NEGATIVE Final    Comment:        The GeneXpert MRSA Assay (FDA approved for NASAL specimens only), is one component of a comprehensive MRSA colonization surveillance program. It is not intended to diagnose MRSA infection nor to guide or monitor treatment for MRSA infections.     Labs: BNP (last 3 results) Recent Labs    06/20/17 1033  BNP 979.8*   Basic Metabolic Panel: Recent Labs  Lab 06/21/17 0901 06/22/17 0527 06/23/17 0743 06/24/17 0623 06/25/17 0342  NA 135 137 136 136 135  K 3.7 3.4* 4.2 4.0 4.5  CL 98* 100* 99* 98* 97*  CO2 24 27 27 26 29   GLUCOSE 163* 123* 113* 121* 126*  BUN 18 19 17 16 17   CREATININE 1.57* 1.02 0.90 0.89 0.96  CALCIUM 7.9* 8.3* 8.4* 8.5* 8.5*  MG  --   --  1.5* 1.8 1.7  PHOS  --   --  2.9 3.0 3.1   Liver Function Tests: Recent Labs  Lab 06/20/17 1033 06/23/17 0743 06/24/17 0623 06/25/17 0342  AST 16 24 24 23   ALT 15* 18 20 20   ALKPHOS 68 59 70 69  BILITOT 1.9* 1.3* 1.7* 1.6*  PROT 6.3* 5.2* 5.5* 5.4*   ALBUMIN 2.8* 2.2* 2.3* 2.2*   No results for input(s): LIPASE, AMYLASE in the last 168 hours. No results for input(s): AMMONIA in the last 168 hours. CBC: Recent Labs  Lab 06/20/17 1033 06/21/17 0901 06/22/17 0527 06/23/17 0743 06/24/17 0623 06/25/17 0342  WBC 11.2* 10.2 9.0 7.7 9.4 8.5  NEUTROABS 9.4*  --   --  5.8 7.2 6.0  HGB 13.6 12.1* 12.3* 11.9* 12.3* 12.1*  HCT 42.8 37.5* 39.0 38.0* 39.5 39.2  MCV 97.3 96.2 96.8 98.4 97.8 98.2  PLT 149* 139* 144* 153 162 169   Cardiac Enzymes: Recent Labs  Lab 06/20/17 1033 06/20/17 1233  CKTOTAL 90  --   TROPONINI  --  0.03*   BNP: Invalid input(s): POCBNP CBG: No results for input(s): GLUCAP in the last 168 hours. D-Dimer No results for input(s): DDIMER in the last 72 hours. Hgb A1c No results for input(s): HGBA1C in the last 72 hours. Lipid Profile No results for input(s): CHOL, HDL, LDLCALC, TRIG, CHOLHDL, LDLDIRECT in the last 72 hours. Thyroid function studies No results for input(s): TSH, T4TOTAL, T3FREE, THYROIDAB in the last 72 hours.  Invalid input(s): FREET3 Anemia work up No results for input(s): VITAMINB12, FOLATE, FERRITIN, TIBC, IRON, RETICCTPCT in the last 72 hours. Urinalysis    Component Value Date/Time   COLORURINE YELLOW 06/20/2017 1857   APPEARANCEUR CLEAR 06/20/2017 1857   LABSPEC 1.013 06/20/2017 1857   PHURINE 6.0 06/20/2017 1857   GLUCOSEU NEGATIVE 06/20/2017 1857   HGBUR SMALL (A) 06/20/2017 1857   BILIRUBINUR NEGATIVE 06/20/2017 1857   KETONESUR 20 (A) 06/20/2017 1857   PROTEINUR NEGATIVE 06/20/2017 1857   UROBILINOGEN 1.0 08/15/2009 1743   NITRITE NEGATIVE 06/20/2017 1857   LEUKOCYTESUR NEGATIVE 06/20/2017 1857   Sepsis Labs Invalid input(s): PROCALCITONIN,  WBC,  LACTICIDVEN Microbiology Recent Results (from the past 240 hour(s))  MRSA PCR Screening     Status: None   Collection Time: 06/20/17  5:46 PM  Result Value Ref Range Status   MRSA by PCR NEGATIVE NEGATIVE Final     Comment:        The GeneXpert MRSA Assay (FDA approved for NASAL specimens only), is one component of a comprehensive MRSA colonization surveillance program. It is not intended to diagnose MRSA infection nor to guide or monitor treatment for MRSA infections.    Time coordinating discharge: 35 minutes  SIGNED:  Kerney Elbe, DO [facility] 06/25/2017, 1:22 PM Pager [phone]  If 7PM-7AM, please contact night-coverage [email or web address] Password [email or web address]

## 2017-06-25 NOTE — Progress Notes (Signed)
Patient ID: Jeremy Macdonald, male   DOB: 18-Oct-1925, 84 y.o.   MRN: [id number] Patient arrived to 4W13 at 1620 via hospital bed and patient belongings. Patient oriented to patient room, fall prevention plan, rehab process, rehab schedule, and rehab safety plan. Patient resting in bed with nurse call bell at side and bed alarm on.

## 2017-06-25 NOTE — Progress Notes (Signed)
Jamse Arn, MD      Jamse Arn, MD  Physician  Physical Medicine and Rehabilitation      Consult Note  Signed     Date of Service:  06/25/2017  9:33 AM         Related encounter: ED to Hosp-Admission (Discharged) from 06/20/2017 in [facility] Collapse All            Expand widget buttonCollapse widget button     Hide copied text   Hover for detailscustomization button                                                                                                                                 untitled image              Physical Medicine and Rehabilitation Consult        Reason for Consult: Debility  Referring Physician: Dr. Alfredia Ferguson.         HPI: Jeremy Macdonald is a 84 y.o. male with history of CAD, dementia, COPD, HOH who was admitted on 06/20/17 with fall due to weakness and dizziness. History taken from chart review and wife. He was found to have nasal fracture as well as mild leucocytosis. He was started on IVF due to concerns of dehydration. Cardiology recommended IV amiodraone and Cardizem due ot A fib with RVR. Foley placed due to urinary retention and Dr. Alyson Ingles consulted due to hematuria from foley trauma. Patient with history of BPH with frequency therefore flomax increased to bid with recommendations to keep foley in place till mobile--to start CBI if hematuria worsens. CT head reviewed, unremarkable for acute intracranial process. Therapy ongoing and patient showing decrease in dizziness and imporvement in acitivty tolerance. CIR recommended due to functional deficits.       Review of Systems   HENT: Positive for hearing loss.    Eyes: Negative for blurred vision and double vision.    Respiratory: Negative for cough and shortness of breath.    Cardiovascular: Negative for chest pain and palpitations.   Gastrointestinal: Negative for heartburn and nausea.   Genitourinary: Negative for dysuria and urgency.   Musculoskeletal: Positive for joint pain (bilateral knee pain--shaky but refuses to use a walker) and myalgias.   Neurological: Positive for dizziness. Negative for speech change and focal weakness.   Psychiatric/Behavioral: Positive for memory loss.   All other systems reviewed and are negative.             Past Medical History:    Diagnosis   Date    .   Abdominal aortic aneurysm (AAA) ([place])        .   Arthritis            "  shoulders" (04/24/2017)    .   Atrial fibrillation with RVR ([place])   03/2017        [name] 03/08/2017    .   COPD (chronic obstructive pulmonary disease) ([place])        .   Coronary artery disease        .   Dementia        .   Fall at home   06/17/2017        nasal fracture    .   High cholesterol        .   HOH (hard of hearing)        .   Hypertension        .   Pneumonia   ~ 2003-2008 X 5        "5 straight years S/P pneumonia shot in ~ 2003"(04/24/2017)    .   Skin cancer   2018        "cut it out of right arm"                Past Surgical History:    Procedure   Laterality   Date    .   APPENDECTOMY            .   CARDIAC CATHETERIZATION            .   CATARACT EXTRACTION, BILATERAL   Bilateral        .   CORONARY ANGIOPLASTY WITH STENT PLACEMENT            .   CORONARY ATHERECTOMY   N/A   04/24/2017        Procedure: CORONARY ATHERECTOMY;  Surgeon: Adrian Prows, MD;  Location: [place] [facility];  Service: Cardiovascular;  Laterality: N/A;    .   CORONARY STENT INTERVENTION   N/A   04/24/2017        Procedure: CORONARY STENT INTERVENTION;  Surgeon: Adrian Prows, MD;  Location: [place] [facility];  Service: Cardiovascular;  Laterality: N/A;    .   INCISION AND DRAINAGE       1950s        "RLE; got hurt in service; had to get a bunch of stuff out"    .   LEFT HEART CATH AND CORONARY ANGIOGRAPHY   N/A   03/19/2017        Procedure: LEFT HEART CATH AND CORONARY ANGIOGRAPHY;  Surgeon: Adrian Prows, MD;  Location: [place] [facility];  Service: Cardiovascular;  Laterality: N/A;    .   SKIN CANCER EXCISION   Right   2018        "arms"    .   TOOTH EXTRACTION                        Family History    Problem   Relation   Age of Onset    .   Heart attack   Father        .   Cancer   Sister        .   Lung cancer   Brother              Social History:  Married. He reports that he has been smoking cigarettes.  He smoked for 66 years--used to smoke " a lot" but down to 1 PPD a chew. . He has quit using smokeless tobacco. He reports  that he does not drink alcohol or use drugs.               Allergies    Allergen   Reactions    .   Phenergan [Promethazine Hcl]   Other (See Comments)            Confusion                  Medications Prior to Admission    Medication   Sig   Dispense   Refill    .   acetaminophen (TYLENOL) 325 MG tablet   Take 325 mg daily as needed by mouth for moderate pain.             Marland Kitchen   atorvastatin (LIPITOR) 10 MG tablet   Take 10 mg by mouth daily.            .   benazepril-hydrochlorthiazide (LOTENSIN HCT) 20-25 MG tablet   Take 1 tablet by mouth daily.            .   cephALEXin (KEFLEX) 500 MG capsule   Take 1 capsule (500 mg total) by mouth 3 (three) times daily.   15 capsule   0    .   clopidogrel (PLAVIX) 75 MG tablet   Take 1 tablet (75 mg total) by mouth daily with breakfast.   30 tablet   0    .   Dutasteride-Tamsulosin HCl (JALYN) 0.5-0.4 MG CAPS    Take 1 tablet by mouth daily.            .   metoprolol tartrate (LOPRESSOR) 25 MG tablet   Take 2 tablets (50 mg total) by mouth 3 (three) times daily. (Patient taking differently: Take 50 mg by mouth 2 (two) times daily. )   90 tablet   0    .   rivaroxaban (XARELTO) 15 MG TABS tablet   Take 1 tablet (15 mg total) by mouth daily with supper. Start this evening            .   traMADol (ULTRAM) 50 MG tablet   Take 1 tablet (50 mg total) by mouth every 6 (six) hours as needed.   20 tablet   0          Home:  Home Living  Family/patient expects to be discharged to:: Skilled nursing facility  Living Arrangements: Spouse/significant other  Available Help at Discharge: Family, Available 24 hours/day  Type of Home: Apartment  Home Access: Level entry  Home Layout: One level  Bathroom Shower/Tub: Tourist information centre manager: Handicapped height  Home Equipment: Environmental consultant - standard   Functional History:  Prior Function  Level of Independence: Independent with assistive device(s)  Comments: Per PT used standard walker for ambulation but reported to OT no assistive device  Functional Status:   Mobility:  Bed Mobility  Overal bed mobility: Needs Assistance  Bed Mobility: Rolling, Sidelying to Sit  Rolling: Min assist  Sidelying to sit: Min assist  Supine to sit: Mod assist, +2 for physical assistance  Sit to supine: Mod assist, +2 for physical assistance  General bed mobility comments: min A for trunk and LEs, guarded due to neck pain  Transfers  Overall transfer level: Needs assistance  Equipment used: Rolling walker (2 wheeled)  Transfers: Sit to/from Stand  Sit to Stand: Min assist  General transfer comment: cues for hand placement, min A to stand, HR 94 bpm with standing  Ambulation/Gait  Ambulation/Gait assistance: Min assist  Ambulation Distance (Feet): 50 Feet  Assistive device: Rolling walker (2  wheeled)  General Gait Details: min A for steadying assist, cues for safety with RW, pt requires 2 x standing rest break during gait       ADL:  ADL  Overall ADL's : Needs assistance/impaired  Eating/Feeding: Supervision/ safety, Sitting  Grooming: Supervision/safety, Sitting  Upper Body Bathing: Minimal assistance, Sitting  Lower Body Bathing: Moderate assistance, Sit to/from stand  Upper Body Dressing : Minimal assistance, Sitting  Lower Body Dressing: Moderate assistance, Sit to/from stand  Toilet Transfer: Minimal assistance, Ambulation, RW  Toilet Transfer Details (indicate cue type and reason): from elevated surface with cues for safe hand placement.  Toileting- Clothing Manipulation and Hygiene: Sit to/from stand, Moderate assistance, Minimal assistance  Functional mobility during ADLs: Minimal assistance, Rolling walker  General ADL Comments: Pt unsteady and with decreased activity tolerance for ADL. Limited by neck pain.      Cognition:  Cognition  Overall Cognitive Status: History of cognitive impairments - at baseline  Orientation Level: Oriented X4  Cognition  Arousal/Alertness: Awake/alert  Behavior During Therapy: WFL for tasks assessed/performed  Overall Cognitive Status: History of cognitive impairments - at baseline  General Comments: History of dementia        Blood pressure 113/79, pulse 90, temperature (!) 97.4 F (36.3 C), temperature source Oral, resp. rate 12, height 5\' 9"  (1.753 m), weight 78.3 kg (172 lb 9.6 oz), SpO2 92 %.  Physical Exam   Nursing note and vitals reviewed.  Constitutional: He is oriented to person, place, and time. He appears well-developed. No distress.  Multiple facial abrasions. Healing abrasion nasal bridge with sutures. Ecchymotic area chin and neck.  Frail   HENT:   Head: Normocephalic and atraumatic.   Eyes: Conjunctivae and EOM are normal. Pupils are equal, round, and reactive to light.    Neck: Normal range of motion. Neck supple.   Cardiovascular: An irregularly irregular rhythm present. Tachycardia present.   Respiratory: Effort normal and breath sounds normal. No stridor. No respiratory distress.   GI: Soft. Bowel sounds are normal. He exhibits no distension. There is no tenderness.  Genitourinary:  Genitourinary Comments: +Foley with blood tinged drainage  Musculoskeletal:  Pain with ROM of right knees.   Neurological: He is alert and oriented to person, place, and time.  HOH Motor: B/l UE 4/5 proximal to distal B/l LE: HF 2/5, KE 2+/5, ADF/PF 4+/5   Skin: Skin is warm and dry. He is not diaphoretic.  Psychiatric: He has a normal mood and affect. He is slowed.         Lab Results Last 24 Hours  Imaging Results (Last 48 hours)          Assessment/Plan:  Diagnosis: Debility  Labs and images independently reviewed.  Records reviewed and summated above.     1.Does the  need for close, 24 hr/day medical supervision in concert with the patient's rehab needs make it unreasonable for this patient to be served in a less intensive setting? Yes    2.Co-Morbidities requiring supervision/potential complications:  CAD (cont meds), dementia, COPD (monitor O2 sats and RR with increased exertion), HOH, leukocytosis (cont to monitor for signs and symptoms of infection, further workup if indicated), A fib with RVR (monitor HR with increased mobility), urinary retention (cont foley), hematuria (recs per Urology), BPH (cont meds), ABLA (transfuse if necessary to ensure appropriate perfusion for increased activity tolerance), hypoalbuminemia (maximize nutrition for overall health and wound healing)   3.Due to bladder management, safety, skin/wound care, disease management, pain management and patient education, does the patient require 24 hr/day rehab nursing? Yes   4.Does the patient require coordinated care of a physician, rehab nurse, PT (1-2 hrs/day, 5 days/week) and OT (1-2 hrs/day, 5 days/week) to address physical and functional deficits in the context of the above medical diagnosis(es)? Yes Addressing deficits in the following areas: balance, endurance, locomotion, strength, transferring, bathing, dressing, toileting and psychosocial support   5.Can the patient actively participate in an intensive therapy program of at least 3 hrs of therapy per day at least 5 days per week? Yes   6.The potential for patient to make measurable gains while on inpatient rehab is excellent   7.Anticipated functional outcomes upon discharge from inpatient rehab are modified independent and supervision  with PT, modified independent and supervision with OT, n/a with SLP.   8.Estimated rehab length of stay to reach the above functional goals is: 7-12 days.   9.Anticipated D/C setting: Home   10.Anticipated post D/C treatments: HH therapy and Home excercise program   11.Overall  Rehab/Functional Prognosis: excellent and good      RECOMMENDATIONS:  This patient's condition is appropriate for continued rehabilitative care in the following setting: CIR  Patient has agreed to participate in recommended program. Yes  Note that insurance prior authorization may be required for reimbursement for recommended care.     Comment: Rehab Admissions Coordinator to follow up.     Delice Lesch, MD, ABPMR  Bary Leriche, PA-C  06/25/2017                Revision History                                        Routing History

## 2017-06-25 NOTE — Progress Notes (Signed)
Report given to Stacy,RN at inpt rehab. All questions answered. Call back number left in case she had anymore questions. All belongings gathered & pt as well as wife notified of pt being tx to [facility]. Hoover Brunette, RN

## 2017-06-25 NOTE — Discharge Instructions (Signed)
Indwelling Urinary Catheter Care, Adult Take good care of your catheter to keep it working and to prevent problems. How to wear your catheter Attach your catheter to your leg with tape (adhesive tape) or a leg strap. Make sure it is not too tight. If you use tape, remove any bits of tape that are already on the catheter. How to wear a drainage bag You should have:  A large overnight bag.  A small leg bag.  Overnight Bag You may wear the overnight bag at any time. Always keep the bag below the level of your bladder but off the floor. When you sleep, put a clean plastic bag in a wastebasket. Then hang the bag inside the wastebasket. Leg Bag Never wear the leg bag at night. Always wear the leg bag below your knee. Keep the leg bag secure with a leg strap or tape. How to care for your skin  Clean the skin around the catheter at least once every day.  Shower every day. Do not take baths.  Put creams, lotions, or ointments on your genital area only as told by your doctor.  Do not use powders, sprays, or lotions on your genital area. How to clean your catheter and your skin 1. Wash your hands with soap and water. 2. Wet a washcloth in warm water and gentle (mild) soap. 3. Use the washcloth to clean the skin where the catheter enters your body. Clean downward and wipe away from the catheter in small circles. Do not wipe toward the catheter. 4. Pat the area dry with a clean towel. Make sure to clean off all soap. How to care for your drainage bags Empty your drainage bag when it is ?- full or at least 2-3 times a day. Replace your drainage bag once a month or sooner if it starts to smell bad or look dirty. Do not clean your drainage bag unless told by your doctor. Emptying a drainage bag  Supplies Needed  Rubbing alcohol.  Gauze pad or cotton ball.  Tape or a leg strap.  Steps 1. Wash your hands with soap and water. 2. Separate (detach) the bag from your leg. 3. Hold the bag over  the toilet or a clean container. Keep the bag below your hips and bladder. This stops pee (urine) from going back into the tube. 4. Open the pour spout at the bottom of the bag. 5. Empty the pee into the toilet or container. Do not let the pour spout touch any surface. 6. Put rubbing alcohol on a gauze pad or cotton ball. 7. Use the gauze pad or cotton ball to clean the pour spout. 8. Close the pour spout. 9. Attach the bag to your leg with tape or a leg strap. 10. Wash your hands.  Changing a drainage bag Supplies Needed  Alcohol wipes.  A clean drainage bag.  Adhesive tape or a leg strap.  Steps 1. Wash your hands with soap and water. 2. Separate the dirty bag from your leg. 3. Pinch the rubber catheter with your fingers so that pee does not spill out. 4. Separate the catheter tube from the drainage tube where these tubes connect (at the connection valve). Do not let the tubes touch any surface. 5. Clean the end of the catheter tube with an alcohol wipe. Use a different alcohol wipe to clean the end of the drainage tube. 6. Connect the catheter tube to the drainage tube of the clean bag. 7. Attach the new bag to  the leg with adhesive tape or a leg strap. 8. Wash your hands.  How to prevent infection and other problems  Never pull on your catheter or try to remove it. Pulling can damage tissue in your body.  Always wash your hands before and after touching your catheter.  If a leg strap gets wet, replace it with a dry one.  Drink enough fluids to keep your pee clear or pale yellow, or as told by your doctor.  Do not let the drainage bag or tubing touch the floor.  Wear cotton underwear.  If you are male, wipe from front to back after you poop (have a bowel movement).  Check on the catheter often to make sure it works and the tubing is not twisted. Get help if:  Your pee is cloudy.  Your pee smells unusually bad.  Your pee is not draining into the bag.  Your  tube gets clogged.  Your catheter starts to leak.  Your bladder feels full. Get help right away if:  You have redness, swelling, or pain where the catheter enters your body.  You have fluid, pus, or a bad smell coming from the area where the catheter enters your body.  The area where the catheter enters your body feels warm.  You have a fever.  You have pain in your: ? Stomach (abdomen). ? Legs. ? Lower back. ? Bladder.  You see blood fill the catheter.  Your pee is pink or red.  You feel sick to your stomach (nauseous).  You throw up (vomit).  You have chills.  Your catheter gets pulled out. This information is not intended to replace advice given to you by your health care provider. Make sure you discuss any questions you have with your health care provider. Document Released: 09/16/2012 Document Revised: 04/19/2016 Document Reviewed: 11/04/2013 Elsevier Interactive Patient Education  2018 [place] on my medicine - XARELTO (Rivaroxaban)  This medication education was reviewed with me or my healthcare representative as part of my discharge preparation.  The pharmacist that spoke with me during my hospital stay was:  Wayland Salinas, [facility]  Why was Xarelto prescribed for you? Xarelto was prescribed for you to reduce the risk of a blood clot forming that can cause a stroke if you have a medical condition called atrial fibrillation (a type of irregular heartbeat).  What do you need to know about xarelto ? Take your Xarelto ONCE DAILY at the same time every day with your evening meal. If you have difficulty swallowing the tablet whole, you may crush it and mix in applesauce just prior to taking your dose.  Take Xarelto exactly as prescribed by your doctor and DO NOT stop taking Xarelto without talking to the doctor who prescribed the medication.  Stopping without other stroke prevention medication to take the place of Xarelto  may increase your risk of developing a clot that causes a stroke.  Refill your prescription before you run out.  After discharge, you should have regular check-up appointments with your healthcare provider that is prescribing your Xarelto.  In the future your dose may need to be changed if your kidney function or weight changes by a significant amount.  What do you do if you miss a dose? If you are taking Xarelto ONCE DAILY and you miss a dose, take it as soon as you remember on the same day then continue your regularly scheduled once daily regimen the next day. Do not  take two doses of Xarelto at the same time or on the same day.   Important Safety Information A possible side effect of Xarelto is bleeding. You should call your healthcare provider right away if you experience any of the following: ? Bleeding from an injury or your nose that does not stop. ? Unusual colored urine (red or dark brown) or unusual colored stools (red or black). ? Unusual bruising for unknown reasons. ? A serious fall or if you hit your head (even if there is no bleeding).  Some medicines may interact with Xarelto and might increase your risk of bleeding while on Xarelto. To help avoid this, consult your healthcare provider or pharmacist prior to using any new prescription or non-prescription medications, including herbals, vitamins, non-steroidal anti-inflammatory drugs (NSAIDs) and supplements.  This website has more information on Xarelto: [email or web address]

## 2017-06-25 NOTE — Progress Notes (Signed)
Cristina Gong, RN  Rehab Admission Coordinator  Physical Medicine and Rehabilitation  PMR Pre-admission  Signed  Date of Service:  06/25/2017 1:43 PM       Related encounter: ED to Hosp-Admission (Discharged) from 06/20/2017 in [facility]      Signed           [] Hide copied text  [] Hover for details   PMR Admission Coordinator Pre-Admission Assessment  Patient: Jeremy Macdonald is an 84 y.o., male MRN: [id number] DOB: [birth year] Height: 5\' 9"  (175.3 cm) Weight: 78.3 kg (172 lb 9.6 oz)                                                                                                                                                  Insurance Information HMO:     PPO:      PCP:      IPA:      80/20: yes     OTHER: no HMO PRIMARY: Medicare a and b      Policy#: [id number]      Subscriber: pt Benefits:  Phone #: passport one online     Name: 06/25/2017 Eff. Date: 03/05/1993     Deduct: $1362      Out of Pocket Max: none      Life Max: none CIR: 100%      SNF: 20 full days Outpatient: 80%     Co-Pay: 20% Home Health: 100%      Co-Pay: none DME: 80%     Co-Pay: 20% Providers: pt choice  SECONDARY: BCBS      Policy#: [phone]      Subscriber: pt  Medicaid Application Date:       Case Manager:  Disability Application Date:       Case Worker:   Emergency Contact Information        Contact Information    Name Relation Home Work Mobile   Flippo,[name] Spouse [phone]       Current Medical History  Patient Admitting Diagnosis: debility  History of Present Illness: HYW:[id number] H Dickersonis a 84 y.o.malewith history of CAD, dementia, COPD, HOH who was admitted on 06/20/17 with fall due to weakness and dizziness.He was found to have nasal fracture as well as mild leucocytosis. He was started on IVF due to concerns of dehydration. Cardiology recommended IV amiodraone and Cardizem due to A fib with RVR. Foley placed due to  urinary retention and Dr. Alyson Ingles consulted due to hematuria from foley trauma. Patient with history of BPH with frequency therefore flomax increased to bid with recommendations to keep foley in place till mobile--to start CBI if hematuria worsens.CT head reviewed, unremarkable for acute intracranial process.  Past Medical History      Past Medical History:  Diagnosis Date  . Abdominal aortic aneurysm (AAA) ([place])   . Arthritis    "  shoulders" (04/24/2017)  . Atrial fibrillation with RVR ([place]) 03/2017   [name] 03/08/2017  . COPD (chronic obstructive pulmonary disease) ([place])   . Coronary artery disease   . Dementia   . Fall at home 06/17/2017   nasal fracture  . High cholesterol   . HOH (hard of hearing)   . Hypertension   . Pneumonia ~ 2003-2008 X 5   "5 straight years S/P pneumonia shot in ~ 2003"(04/24/2017)  . Skin cancer 2018   "cut it out of right arm"    Family History  family history includes Cancer in his sister; Heart attack in his father; Lung cancer in his brother.  Prior Rehab/Hospitalizations:  Has the patient had major surgery during 100 days prior to admission? No  Current Medications   Current Facility-Administered Medications:  .  acetaminophen (TYLENOL) tablet 650 mg, 650 mg, Oral, Q4H PRN, Patwardhan, Manish J, MD, 650 mg at 06/25/17 1248 .  amiodarone (PACERONE) tablet 200 mg, 200 mg, Oral, TID, Adrian Prows, MD, 200 mg at 06/25/17 1007 .  atorvastatin (LIPITOR) tablet 10 mg, 10 mg, Oral, Q2000, Patwardhan, Manish J, MD, 10 mg at 06/24/17 2217 .  digoxin (LANOXIN) tablet 0.125 mg, 0.125 mg, Oral, Daily, Adrian Prows, MD, 0.125 mg at 06/25/17 1007 .  diltiazem (CARDIZEM) tablet 60 mg, 60 mg, Oral, Q8H, Adrian Prows, MD .  docusate sodium (COLACE) capsule 100 mg, 100 mg, Oral, BID PRN, Patwardhan, Manish J, MD .  dutasteride (AVODART) capsule 0.5 mg, 0.5 mg, Oral, Daily, 0.5 mg at 06/25/17 1007 **AND** tamsulosin (FLOMAX) capsule 0.4 mg, 0.4 mg,  Oral, Daily, Patwardhan, Manish J, MD, 0.4 mg at 06/25/17 1007 .  feeding supplement (ENSURE ENLIVE) (ENSURE ENLIVE) liquid 237 mL, 237 mL, Oral, BID BM, Sheikh, Omair Latif, DO, 237 mL at 06/25/17 1003 .  metoprolol tartrate (LOPRESSOR) tablet 50 mg, 50 mg, Oral, BID, Patwardhan, Manish J, MD, 50 mg at 06/25/17 1007 .  multivitamin with minerals tablet 1 tablet, 1 tablet, Oral, Daily, Raiford Noble [place], [place], 1 tablet at 06/25/17 1007 .  ondansetron (ZOFRAN) injection 4 mg, 4 mg, Intravenous, Q6H PRN, Patwardhan, Manish J, MD .  polyethylene glycol (MIRALAX / GLYCOLAX) packet 17 g, 17 g, Oral, Daily, Patwardhan, Manish J, MD, 17 g at 06/24/17 0952 .  Rivaroxaban (XARELTO) tablet 15 mg, 15 mg, Oral, Q supper, Karren Cobble, RPH .  traMADol (ULTRAM) tablet 50 mg, 50 mg, Oral, Q6H PRN, Patwardhan, Manish J, MD, 50 mg at 06/25/17 1003  Patients Current Diet: Diet 2 gram sodium Room service appropriate? Yes; Fluid consistency: Thin Diet - low sodium heart healthy  Precautions / Restrictions Precautions Precautions: Fall Precaution Comments: Watch HR; had a fall PTA Restrictions Weight Bearing Restrictions: No   Has the patient had 2 or more falls or a fall with injury in the past year?No  Prior Activity Level Community (5-7x/wk): Mod I with cane and driving pta  [place] Devices/Equipment: Cane (specify quad or straight), Walker (specify type) Home Equipment: Walker - standard  Prior Device Use: Indicate devices/aids used by the patient prior to current illness, exacerbation or injury? cane  Prior Functional Level Prior Function Level of Independence: Independent with assistive device(s) Comments: Per PT used standard walker for ambulation but reported to OT no assistive device  Self Care: Did the patient need help bathing, dressing, using the toilet or eating?  Independent  Indoor Mobility: Did the patient need assistance  with walking from room to  room (with or without device)? Independent  Stairs: Did the patient need assistance with internal or external stairs (with or without device)? Independent  Functional Cognition: Did the patient need help planning regular tasks such as shopping or remembering to take medications? Independent  Current Functional Level Cognition  Overall Cognitive Status: (wife denies much confusion at baseline. Son states mild conf) Orientation Level: Oriented X4 General Comments: History of dementia    Extremity Assessment (includes Sensation/Coordination)  Upper Extremity Assessment: Generalized weakness  Lower Extremity Assessment: Generalized weakness    ADLs  Overall ADL's : Needs assistance/impaired Eating/Feeding: Supervision/ safety, Sitting Grooming: Supervision/safety, Sitting Upper Body Bathing: Minimal assistance, Sitting Lower Body Bathing: Moderate assistance, Sit to/from stand Upper Body Dressing : Minimal assistance, Sitting Lower Body Dressing: Moderate assistance, Sit to/from stand Toilet Transfer: Minimal assistance, Ambulation, RW Toilet Transfer Details (indicate cue type and reason): from elevated surface with cues for safe hand placement. Toileting- Clothing Manipulation and Hygiene: Sit to/from stand, Moderate assistance, Minimal assistance Functional mobility during ADLs: Minimal assistance, Rolling walker General ADL Comments: Pt unsteady and with decreased activity tolerance for ADL. Limited by neck pain.     Mobility  Overal bed mobility: Needs Assistance Bed Mobility: Rolling, Sidelying to Sit Rolling: Min assist Sidelying to sit: Min assist Supine to sit: Mod assist, +2 for physical assistance Sit to supine: Mod assist, +2 for physical assistance General bed mobility comments: min A for trunk and LEs, guarded due to neck pain    Transfers  Overall transfer level: Needs assistance Equipment used: Rolling walker (2  wheeled) Transfers: Sit to/from Stand Sit to Stand: Min assist General transfer comment: cues for hand placement, min A to stand, HR 94 bpm with standing    Ambulation / Gait / Stairs / Wheelchair Mobility  Ambulation/Gait Ambulation/Gait assistance: Museum/gallery curator (Feet): 50 Feet Assistive device: Rolling walker (2 wheeled) General Gait Details: min A for steadying assist, cues for safety with RW, pt requires 2 x standing rest break during gait    Posture / Balance Balance Overall balance assessment: Needs assistance Sitting-balance support: No upper extremity supported, Feet supported Sitting balance-Leahy Scale: Fair Standing balance support: Bilateral upper extremity supported, During functional activity Standing balance-Leahy Scale: Poor Standing balance comment: static standing balance with close supervision with RW    Special needs/care consideration BiPAP/CPAP  N/a CPM  N/a Continuous Drip IV  N/a Dialysis  N/a Life Vest  N/a Oxygen  N/a Special Bed high fall risk bundle recommended Trach Size  N/a Wound Vac n/a Skin bilateral knees scraped, chin and facial trauma ecchymosis, BUE ecchymosis Bowel mgmt: continent LBM 06/24/2017 Bladder mgmt:indwelling catheter Diabetic mgmt  N/a Wife reports pt was not confused pta   Previous Home Environment Living Arrangements: Spouse/significant other  Lives With: Spouse Available Help at Discharge: Family, Available 24 hours/day Type of Home: Apartment Home Layout: One level Home Access: Level entry Bathroom Shower/Tub: Multimedia programmer: Handicapped height Bathroom Accessibility: Yes How Accessible: Accessible via walker Home Care Services: No  Discharge Living Setting Plans for Discharge Living Setting: Patient's home, Lives with (comment), Apartment(wife) Type of Home at Discharge: Apartment Discharge Home Layout: One level Discharge Home Access: Level entry Discharge Bathroom  Shower/Tub: Walk-in shower Discharge Bathroom Toilet: Standard Discharge Bathroom Accessibility: Yes How Accessible: Accessible via walker Does the patient have any problems obtaining your medications?: No  Social/Family/Support Systems Patient Roles: Spouse, Parent Contact Information: wife Anticipated Caregiver: wife 24/7, [name], son 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  Information: see above Ability/Limitations of Caregiver: wife can provide supervision level Caregiver Availability: 24/7 Discharge Plan Discussed with Primary Caregiver: Yes Is Caregiver In Agreement with Plan?: Yes Does Caregiver/Family have Issues with Lodging/Transportation while Pt is in Rehab?: No  Goals/Additional Needs Patient/Family Goal for Rehab: Mod I to supervision with PT, OT, and SLP Expected length of stay: ELOS 7 to 12 days Equipment Needs: fall precautions bundle recommended Pt/Family Agrees to Admission and willing to participate: Yes Program Orientation Provided & Reviewed with Pt/Caregiver Including Roles  & Responsibilities: Yes Additional Information Needs: wife retired Therapist, sports  Decrease burden of Care through IP rehab admission: n/a  Possible need for SNF placement upon discharge: not anticipated, but wife open to SNF if pt does not reach supervision level  Patient Condition: This patient's condition remains as documented in the consult dated 06/25/2017, in which the Rehabilitation Physician determined and documented that the patient's condition is appropriate for intensive rehabilitative care in an inpatient rehabilitation facility. Will admit to inpatient rehab today.  Preadmission Screen Completed By:  Cleatrice Burke, 06/25/2017 1:43 PM ______________________________________________________________________   Discussed status with Dr. Posey Pronto on 06/25/2017 at  1351  and received telephone approval for admission today.  Admission Coordinator:  Cleatrice Burke, time  5300 Date 06/25/2017             Cosigned by: Jamse Arn, MD at 06/25/2017 1:53 PM  Revision History

## 2017-06-25 NOTE — Progress Notes (Signed)
ANTICOAGULATION CONSULT NOTE - Initial Consult  Pharmacy Consult for Xarelto Indication: atrial fibrillation  Allergies  Allergen Reactions  . Phenergan [Promethazine Hcl] Other (See Comments)    Confusion     Patient Measurements: Height: 5\' 9"  (175.3 cm) Weight: 172 lb 9.6 oz (78.3 kg) IBW/kg (Calculated) : 70.7  Vital Signs: Temp: 97.4 F (36.3 C) (01/21 0439) Temp Source: Oral (01/21 0439) BP: 113/79 (01/21 0601) Pulse Rate: 90 (01/21 0439)  Labs: Recent Labs    06/23/17 0743 06/24/17 0623 06/25/17 0342  HGB 11.9* 12.3* 12.1*  HCT 38.0* 39.5 39.2  PLT 153 162 169  CREATININE 0.90 0.89 0.96    Estimated Creatinine Clearance: 52.2 mL/min (by C-G formula based on SCr of 0.96 mg/dL).   Medical History: Past Medical History:  Diagnosis Date  . Abdominal aortic aneurysm (AAA) ([place])   . Arthritis    "shoulders" (04/24/2017)  . Atrial fibrillation with RVR ([place]) 03/2017   [name] 03/08/2017  . COPD (chronic obstructive pulmonary disease) ([place])   . Coronary artery disease   . Dementia   . Fall at home 06/17/2017   nasal fracture  . High cholesterol   . HOH (hard of hearing)   . Hypertension   . Pneumonia ~ 2003-2008 X 5   "5 straight years S/P pneumonia shot in ~ 2003"(04/24/2017)  . Skin cancer 2018   "cut it out of right arm"    Assessment: CC/HPI: 84 yr old male admitted 1/16 pm with generalized weakness, dizziness. Atrial Fib with RVR after suffering a fall on 1/13 with nasal fracture and multiple ecchymoses.   PMH: CAD, PCI 04/24/17, afib, AAA, bilateral mild carotid artery stenosis, HTN, HLD, smoker, COPD, dementia, BPH  Significant events: fell pta (11/13) - nasal bone fx  Anticoag: Xarelto 15 mg daily with supper for afib.>>leaving at this dose as there is concern for bleeds/falls/dizziness even though could take 20mg  dose. Hgb 12.1 stable. Plts 169 stable. Plavix dc'd 1/18 to reduce risk of bleeding - 84 y/o, 78kg, Scr 0.96, CrCl 52  Goal of  Therapy:  Therapeutic oral anticoagulation   Plan:  Xarelto 15 mg daily with supper (to continue per cards), discontinued plavix to reduce bleeding risk   Jack Bolio S. Alford Highland, PharmD, BCPS Clinical Staff Pharmacist Pager [phone]  Eilene Ghazi Stillinger 06/25/2017,9:41 AM

## 2017-06-26 ENCOUNTER — Inpatient Hospital Stay (HOSPITAL_COMMUNITY): Payer: Medicare Other | Admitting: Speech Pathology

## 2017-06-26 ENCOUNTER — Inpatient Hospital Stay (HOSPITAL_COMMUNITY): Payer: Medicare Other | Admitting: Occupational Therapy

## 2017-06-26 ENCOUNTER — Inpatient Hospital Stay (HOSPITAL_COMMUNITY): Payer: Medicare Other

## 2017-06-26 ENCOUNTER — Inpatient Hospital Stay (HOSPITAL_COMMUNITY): Payer: Medicare Other | Admitting: Physical Therapy

## 2017-06-26 DIAGNOSIS — G4701 Insomnia due to medical condition: Secondary | ICD-10-CM

## 2017-06-26 DIAGNOSIS — E440 Moderate protein-calorie malnutrition: Secondary | ICD-10-CM

## 2017-06-26 DIAGNOSIS — S069X1S Unspecified intracranial injury with loss of consciousness of 30 minutes or less, sequela: Secondary | ICD-10-CM

## 2017-06-26 DIAGNOSIS — R5381 Other malaise: Principal | ICD-10-CM

## 2017-06-26 LAB — COMPREHENSIVE METABOLIC PANEL
ALT: 25 U/L (ref 17–63)
ANION GAP: 13 (ref 5–15)
AST: 31 U/L (ref 15–41)
Albumin: 2.1 g/dL — ABNORMAL LOW (ref 3.5–5.0)
Alkaline Phosphatase: 80 U/L (ref 38–126)
BILIRUBIN TOTAL: 1.3 mg/dL — AB (ref 0.3–1.2)
BUN: 18 mg/dL (ref 6–20)
CO2: 25 mmol/L (ref 22–32)
Calcium: 8.5 mg/dL — ABNORMAL LOW (ref 8.9–10.3)
Chloride: 95 mmol/L — ABNORMAL LOW (ref 101–111)
Creatinine, Ser: 0.95 mg/dL (ref 0.61–1.24)
GFR calc non Af Amer: >60 mL/min (ref >60)
Glucose, Bld: 136 mg/dL — ABNORMAL HIGH (ref 65–99)
POTASSIUM: 3.9 mmol/L (ref 3.5–5.1)
Sodium: 133 mmol/L — ABNORMAL LOW (ref 135–145)
TOTAL PROTEIN: 5.4 g/dL — AB (ref 6.5–8.1)

## 2017-06-26 LAB — CBC WITH DIFFERENTIAL/PLATELET
BASOS PCT: 0 %
Basophils Absolute: 0 10*3/uL (ref 0.0–0.1)
Eosinophils Absolute: 0.1 10*3/uL (ref 0.0–0.7)
Eosinophils Relative: 1 %
HEMATOCRIT: 38.8 % — AB (ref 39.0–52.0)
Hemoglobin: 12.4 g/dL — ABNORMAL LOW (ref 13.0–17.0)
Lymphocytes Relative: 7 %
Lymphs Abs: 0.7 10*3/uL (ref 0.7–4.0)
MCH: 30.9 pg (ref 26.0–34.0)
MCHC: 32 g/dL (ref 30.0–36.0)
MCV: 96.8 fL (ref 78.0–100.0)
MONO ABS: 1.3 10*3/uL — AB (ref 0.1–1.0)
MONOS PCT: 13 %
NEUTROS ABS: 7.5 10*3/uL (ref 1.7–7.7)
Neutrophils Relative %: 79 %
Platelets: 187 10*3/uL (ref 150–400)
RBC: 4.01 MIL/uL — ABNORMAL LOW (ref 4.22–5.81)
RDW: 14.2 % (ref 11.5–15.5)
WBC: 9.5 10*3/uL (ref 4.0–10.5)

## 2017-06-26 MED ORDER — QUETIAPINE FUMARATE 25 MG PO TABS
25.0000 mg | ORAL_TABLET | Freq: Every day | ORAL | Status: DC
  Administered 2017-06-26 – 2017-06-27 (×2): 25 mg via ORAL
  Filled 2017-06-26 (×2): qty 1

## 2017-06-26 MED ORDER — TRAMADOL HCL 50 MG PO TABS
25.0000 mg | ORAL_TABLET | Freq: Four times a day (QID) | ORAL | Status: DC | PRN
  Administered 2017-06-26 – 2017-07-07 (×10): 25 mg via ORAL
  Filled 2017-06-26 (×10): qty 1

## 2017-06-26 MED ORDER — SORBITOL 70 % SOLN
30.0000 mL | Freq: Every day | Status: DC | PRN
  Administered 2017-07-06: 30 mL via ORAL
  Filled 2017-06-26: qty 30

## 2017-06-26 NOTE — Progress Notes (Signed)
Spouse noted that patient has not slept all night. Writer educated the spouse on the pt (dementia) and him being in an unfamiliar place. At this time spouse noted, "I am nurse I do not need you to tell me that. He does not have dementia. I do not know where you got your information from." Writer apologize to the spouse and informed her again with his age and being on different unit may cause him to be restless as well.

## 2017-06-26 NOTE — Progress Notes (Signed)
Physical Therapy Session Note  Patient Details  Name: Jeremy Macdonald MRN: [id number] Date of Birth: [birth year]  Today's Date: 06/26/2017 PT Individual Time: 1638-4665 PT Individual Time Calculation (min): 26 min   Short Term Goals: Week 1:  PT Short Term Goal 1 (Week 1): Pt will negotiate 4 steps with B rails and mod assist. PT Short Term Goal 2 (Week 1): Pt will complete sit<>stand transfers with min assist. PT Short Term Goal 3 (Week 1): Pt will consistently complete stand pivot transfers with min assist. PT Short Term Goal 4 (Week 1): Pt will ambulate 75 ft with LRAD & min assist.  Skilled Therapeutic Interventions/Progress Updates:    Pt seated in w/c upon PT arrival, agreeable to therapy tx and denies pain. Pt disoriented to situation and place this session, able to follow commands but confused throughout session. Pt transported to gym in w/c. Pt performed car transfer stand pivot with mod assist, verbal cues for techniques and sequencing. Pt propelled w/c x 150 ft with min assist using B UEs, verbal cues for techniques. Pt ambulated x 155 ft using RW and mod assist, w/c follow for safety, pt requiring constant cueing throughout to avoid obstacles and for RW management. Pt left seated in w/c at end of session with needs in reach, QRB in place and son present.   Therapy Documentation Precautions:  Restrictions Weight Bearing Restrictions: No   See Function Navigator for Current Functional Status.   Therapy/Group: Individual Therapy  Netta Corrigan, PT, DPT 06/26/2017, 7:55 AM

## 2017-06-26 NOTE — Progress Notes (Signed)
Pt A/O, no noted distress. Pt appeared to be restless, administered prn insomnia regimen (see MAR). Informed the spouse, if she needed me to come in and sat with him I will. Writer and tech was both tried to comfort the spouse. Deficit of knowledge to note the pt has dementia and he is in an unfamiliar place.

## 2017-06-26 NOTE — Progress Notes (Signed)
Summoned to rm by Candace NT and advised that pt had received a small laceration/skin tear appx dime size in nature on the lateral aspect of pt's R calf while performing ADLs. Scant amount of dark red blood noted from wound. Dressed with ABD and ACE wrap. ACE applied without pressure due to skin condition. PT is on rivaroxaban. Will continue to monitor. RN notified.

## 2017-06-26 NOTE — Progress Notes (Addendum)
[facility] PHYSICAL MEDICINE & REHABILITATION     PROGRESS NOTE    Subjective/Complaints:   Objective: Vital Signs: Blood pressure 122/62, pulse 88, temperature (!) 97.5 F (36.4 C), temperature source Oral, resp. rate 20, height 5\' 9"  (1.753 m), weight 77 kg (169 lb 12.1 oz), SpO2 92 %. Dg Knee 1-2 Views Right  Result Date: 06/25/2017 CLINICAL DATA:  Pain when standing to right knee. EXAM: RIGHT KNEE - 1-2 VIEW COMPARISON:  06/20/2017 FINDINGS: Chondrocalcinosis in medial and lateral compartments. Early marginal spur formation about all 3 compartments. Normal alignment. There is a small effusion in the suprapatellar bursa. Negative for fracture. Extensive popliteal arterial calcifications. IMPRESSION: 1. Negative for fracture or other acute bone abnormality. 2. Chondrocalcinosis with early tricompartmental degenerative change and effusion, suggesting CPPD arthropathy. Electronically Signed   By: Lucrezia Europe M.D.   On: 06/25/2017 19:03   Recent Labs    06/25/17 0342 06/26/17 0610  WBC 8.5 9.5  HGB 12.1* 12.4*  HCT 39.2 38.8*  PLT 169 187   Recent Labs    06/25/17 0342 06/26/17 0610  NA 135 133*  K 4.5 3.9  CL 97* 95*  GLUCOSE 126* 136*  BUN 17 18  CREATININE 0.96 0.95  CALCIUM 8.5* 8.5*   CBG (last 3)  No results for input(s): GLUCAP in the last 72 hours.  Wt Readings from Last 3 Encounters:  06/26/17 77 kg (169 lb 12.1 oz)  06/25/17 78.3 kg (172 lb 9.6 oz)  04/25/17 80.2 kg (176 lb 12.9 oz)    Physical Exam:  Constitutional: He is oriented to person, place, and time. He appears well-developed and well-nourished. No distress.  Multiple facial abrasions with scabs. Dependent ecchymosis on chin and down the neck.    Stable HENT:  Head: Normocephalic and atraumatic.  Mouth/Throat: Oropharynx is clear and moist.  Nasal bridge with sutured abrasion.   Eyes: EOM are normal. Pupils are equal, round, and reactive to light.  Neck: Normal range of motion. Neck supple.   Cardiovascular: RRR no jvd  Respiratory.  CTA Bilaterally without wheezes or rales. Normal effort  GI: Soft. Bowel sounds are normal. He exhibits no distension. There is no tenderness.  Musculoskeletal: He exhibits edema.  Edema with mild erythema right knee--question due to effusion. Keeps RLE eternally rotated and pain with attempts at ROM. 1+ pitting edema RLE> LLE  Neurological: Oriented to person.  Could tell me he lives in [place] but thought he was in [place] currently.  Follows simple commands.  Distracted. A cranial nerve deficit is present.  HOH.   Motor: B/l UE 4/5 proximal to distal B/l LE: HF 2/5, KE 2+/5, ADF/PF 4+/5  --stable Psych: Pleasant and not agitated but confused Uro: Blood-tinged urine in catheter and bag   Assessment/Plan: 1.  Gait disorder and cognitive deficits secondary to secondary to recent fall and likely traumatic brain injury as well as debility which require 3+ hours per day of interdisciplinary therapy in a comprehensive inpatient rehab setting. Physiatrist is providing close team supervision and 24 hour management of active medical problems listed below. Physiatrist and rehab team continue to assess barriers to discharge/monitor patient progress toward functional and medical goals.  Function:  Bathing Bathing position      Bathing parts      Bathing assist        Upper Body Dressing/Undressing Upper body dressing                    Upper body assist  Lower Body Dressing/Undressing Lower body dressing                                  Lower body assist        Toileting Toileting          Toileting assist     Transfers Chair/bed transfer             Locomotion Ambulation           Wheelchair          Cognition Comprehension    Expression    Social Interaction    Problem Solving    Memory     Medical Problem List and Plan: 1.  Deficits in mobility and self-care secondary to debility  and TBI,.   -Beginning therapies today   -Had discussion with wife who was present today.  She vehemently denied that her husband suffered from dementia leading up to this admission.  I did not push the matter too much as she had some concerns regarding his care overnight.  Team will continue to discuss and observe moving forward.  I did mention to his wife that we will need to work on reestablishing his sleep first and foremost 2.  DVT Prophylaxis/Anticoagulation: Pharmaceutical: Other (comment)--on Xarelto.  3. Pain Management: continue ultram prn 4. Mood: Ego support 5. Neuropsych: This patient is? Not  capable of making decisions on his own behalf.   -Begin scheduled Seroquel 25 mg at 9 PM each night 6. Skin/Wound Care: pressure relief measures.  7. Fluids/Electrolytes/Nutrition: Encourage p.o. intake.  I thoroughly reviewed patient's labs today.  Need to bruised nutrition and protein based on low albumin.   -Add supplement    -RD follow up 8. A fib with RVR: on amiodarone and metoprolol. Has resolved --started on lanoxin today and Cardizem titrated upwards to 60 mg tid. Monitor for orthostatic symptoms.  9.  Urinary retention: Continue foley for now. Monitor hematuria --on Eliquis as well as H/H.  Pending UA/UCS.    -Continue Avodart and Flomax. 10. Bilateral Knee OA: Right knee x-ray reviewed and shows mild arthritis as well as chondrocalcinosis.  Will observe for range of motion and weightbearing tolerance with therapies 11. Dizziness: Likely due to A flutter. Will check orthostatic vitals. 12. Abnormal LFTs: Likely due to shocked liver and resolving hematoma.    -LFTs reviewed today and essentially within normal limits  LOS (Days) 1 A FACE TO FACE EVALUATION WAS PERFORMED  Meredith Staggers, MD 06/26/2017 9:04 AM
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
## 2017-06-26 NOTE — Progress Notes (Signed)
Patient information reviewed and entered into eRehab system by Jamylah Marinaccio, RN, CRRN, PPS Coordinator.  Information including medical coding and functional independence measure will be reviewed and updated through discharge.     Per nursing patient was given "Data Collection Information Summary for Patients in Inpatient Rehabilitation Facilities with attached "Privacy Act Statement-Health Care Records" upon admission.  
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
## 2017-06-27 ENCOUNTER — Inpatient Hospital Stay (HOSPITAL_COMMUNITY): Payer: Medicare Other | Admitting: Physical Therapy

## 2017-06-27 ENCOUNTER — Encounter (HOSPITAL_COMMUNITY): Payer: Self-pay

## 2017-06-27 ENCOUNTER — Inpatient Hospital Stay (HOSPITAL_COMMUNITY): Payer: Medicare Other | Admitting: Occupational Therapy

## 2017-06-27 ENCOUNTER — Inpatient Hospital Stay (HOSPITAL_COMMUNITY): Payer: Medicare Other | Admitting: *Deleted

## 2017-06-27 ENCOUNTER — Telehealth (HOSPITAL_COMMUNITY): Payer: Self-pay

## 2017-06-27 ENCOUNTER — Inpatient Hospital Stay (HOSPITAL_COMMUNITY): Payer: Medicare Other | Admitting: Speech Pathology

## 2017-06-27 DIAGNOSIS — R339 Retention of urine, unspecified: Secondary | ICD-10-CM

## 2017-06-27 LAB — CBC
HCT: 38.4 % — ABNORMAL LOW (ref 39.0–52.0)
Hemoglobin: 11.9 g/dL — ABNORMAL LOW (ref 13.0–17.0)
MCH: 30.1 pg (ref 26.0–34.0)
MCHC: 31 g/dL (ref 30.0–36.0)
MCV: 97 fL (ref 78.0–100.0)
PLATELETS: 200 10*3/uL (ref 150–400)
RBC: 3.96 MIL/uL — AB (ref 4.22–5.81)
RDW: 14.2 % (ref 11.5–15.5)
WBC: 7.6 10*3/uL (ref 4.0–10.5)

## 2017-06-27 NOTE — Progress Notes (Signed)
Occupational Therapy Session Note  Patient Details  Name: Jeremy Macdonald MRN: [id number] Date of Birth: 17-Oct-1925  Today's Date: 06/27/2017 OT Individual Time: 1000-1056 OT Individual Time Calculation (min): 56 min    Short Term Goals: Week 1:  OT Short Term Goal 1 (Week 1): Pt will transfer with BSC/toilet with min A with LRAD OT Short Term Goal 2 (Week 1): Pt will bathe 10/10 parts with min A sit to stand OT Short Term Goal 3 (Week 1): Pt will be oriented x4 with min environmental cues OT Short Term Goal 4 (Week 1): Pt will dress UB with setup   Skilled Therapeutic Interventions/Progress Updates:    Upon entering the room, pt supine in bed but agreeable to OT intervention. Pt with no c/o pain this session. Supine > sit with max A to EOB for B LEs and trunk. Pt declined bathing this session but requesting to change clothing. Pt required max multimodal cues for dressing tasks with increased time. Pt required mod A for UB dressing and max A for LB self care. Pt standing from bed with mod A for LB clothing management. Pt taking several steps to wheelchair with RW and min A. Pt seated in wheelchair at sink for grooming tasks with set up A . OT assisted pt to day room via wheelchair and given paper and pen for orientation questions. Pt able to write name and DOB correctly on paper. However, pt perseverating on writing the wrong date for today. He also had difficulty remembering Wife's name without max verbal guidance cues. Pt unable to remember address at this time. Pt returning to room at end of session with quick release belt donned and call bell within reach.   Therapy Documentation Precautions:  Precautions Precautions: Fall Precaution Comments: monitor HR Restrictions Weight Bearing Restrictions: No General:   Vital Signs: Therapy Vitals Pulse Rate: 89 BP: 120/60 Pain:   ADL: ADL ADL Comments: see functional navigator Vision   Perception    Praxis   Exercises:    Other Treatments:    See Function Navigator for Current Functional Status.   Therapy/Group: Individual Therapy  Gypsy Decant 06/27/2017, 11:00 AM

## 2017-06-27 NOTE — Progress Notes (Signed)
Physical Therapy Session Note  Patient Details  Name: Jeremy Macdonald MRN: [id number] Date of Birth: 1925-08-31  Today's Date: 06/27/2017 PT Individual Time: 1350-1500 PT Individual Time Calculation (min): 70 min   Short Term Goals: Week 1:  PT Short Term Goal 1 (Week 1): Pt will negotiate 4 steps with B rails and mod assist. PT Short Term Goal 2 (Week 1): Pt will complete sit<>stand transfers with min assist. PT Short Term Goal 3 (Week 1): Pt will consistently complete stand pivot transfers with min assist. PT Short Term Goal 4 (Week 1): Pt will ambulate 75 ft with LRAD & min assist.  Skilled Therapeutic Interventions/Progress Updates:  Pt received in room with wife present & agreeable to tx. Pt noted pain in LE & neck with sit>stand transfers and rest breaks provided PRN. Pt oriented to day of the week, year, and month but not location even when given choice of two (school vs hospital). Therapist provided pt with monthly calendar to keep track of date. Transported pt to gym via w/c total assist for time management. Pt completes sit<>stands with max assist and cuing for hand placement and increased eccentric control. Pt ambulates 100 ft + 100 ft with RW & min assist and after initial trial, per [employer], pt's HR was as low as 29 bpm and SpO2 = 87-90% on room air. Manual HR taken and found to be 51 bpm -- RN made aware of findings and reported that pt received medication prior to session and to continue session while monitoring pt's symptoms (pt asymptomatic at time vitals were taken). After second ambulation trial pt's SpO2 >90% and HR = 84 bpm per [employer]. Transitioned to balance with pt standing on foam with BUE support for max of 30 seconds at a time; pt required max assist for standing balance and cuing for increased knee extension and upright posture. Pt fatigued quickly and required multiple rest breaks but was pleased with "getting up" so much today. At end of session pt left sitting in  w/c in room with QRB donned, all needs within reach, & family present to supervise.   Therapy Documentation Precautions:  Precautions Precautions: Fall Precaution Comments: monitor HR Restrictions Weight Bearing Restrictions: No    See Function Navigator for Current Functional Status.   Therapy/Group: Individual Therapy  [name] 06/27/2017, 3:17 PM

## 2017-06-27 NOTE — Telephone Encounter (Signed)
2nd attempt to call patient in regards to Cardiac Rehab - Lm on vm. Sending letter.

## 2017-06-27 NOTE — Progress Notes (Signed)
Speech Language Pathology Daily Session Note  Patient Details  Name: Jeremy Macdonald MRN: [id number] Date of Birth: 11-22-1925  Today's Date: 06/27/2017 SLP Individual Time: 1500-1530 SLP Individual Time Calculation (min): 30 min  Short Term Goals: Week 1: SLP Short Term Goal 1 (Week 1): Patient will demonstrate functional problem solving for basic and familiar tasks with Mod A verbal cues.  SLP Short Term Goal 2 (Week 1): Patient will demonstrate sustained attention to functional tasks for ~10 minutes with Mod A verbal cues for redirection.  SLP Short Term Goal 3 (Week 1): Patient will demonstrate orientation to time and place with Mod A multimodal cues.  SLP Short Term Goal 4 (Week 1): Patient will identify 2 cognitive and 2 physical changes since admission with Max A multimodal cues.   Skilled Therapeutic Interventions:  Pt was seen for skilled ST targeting cognitive goals.  Pt was seated upright in wheelchair with his wife at bedside.  Pt needed max assist cues to reorient to place and situation.  Signs were posted in pt's room next to calendar to facilitate carryover of orientation information between therapy sessions.  Signs were referred to multiple times within therapy session to improve pt's independence with using them.  SLP also initiated use of a memory notebook for use amongst therapy disciplines and family members.  Therapist recorded information regarding activities of previous therapy sessions from chart review into memory notebook and provided pt's wife with instruction on information to include and how to organize information, emphasizing including date on each Cedric Denison of notebook and recording family members visits and notes from therapy appointments.  Pt could read information from memory notebook with max assist multimodal cues.  Pt was left in wheelchair with wife at bedside.  Continue per current plan of care.   Function:  Eating Eating                  Cognition Comprehension Comprehension assist level: Understands basic 50 - 74% of the time/ requires cueing 25 - 49% of the time  Expression   Expression assist level: Expresses basic 50 - 74% of the time/requires cueing 25 - 49% of the time. Needs to repeat parts of sentences.  Social Interaction Social Interaction assist level: Interacts appropriately 75 - 89% of the time - Needs redirection for appropriate language or to initiate interaction.  Problem Solving Problem solving assist level: Solves basic 50 - 74% of the time/requires cueing 25 - 49% of the time  Memory Memory assist level: Recognizes or recalls 25 - 49% of the time/requires cueing 50 - 75% of the time    Pain Pain Assessment Pain Assessment: No/denies pain  Therapy/Group: Individual Therapy  Jaycen Vercher, Selinda Orion 06/27/2017, 3:46 PM

## 2017-06-27 NOTE — Progress Notes (Signed)
Jeremy Macdonald PHYSICAL MEDICINE & REHABILITATION     PROGRESS NOTE    Subjective/Complaints: Had a much better night.  Wife at bedside.  Patient eating breakfast.  Denies any issues himself  ROS: Limited due to cognitive/behavioral   Objective: Vital Signs: Blood pressure (!) 112/52, pulse 86, temperature 97.6 F (36.4 C), temperature source Oral, resp. rate 18, height 5\' 9"  (1.753 m), weight 77.1 kg (170 lb 1.2 oz), SpO2 96 %. Dg Knee 1-2 Views Right  Result Date: 06/25/2017 CLINICAL DATA:  Pain when standing to right knee. EXAM: RIGHT KNEE - 1-2 VIEW COMPARISON:  06/20/2017 FINDINGS: Chondrocalcinosis in medial and lateral compartments. Early marginal spur formation about all 3 compartments. Normal alignment. There is a small effusion in the suprapatellar bursa. Negative for fracture. Extensive popliteal arterial calcifications. IMPRESSION: 1. Negative for fracture or other acute bone abnormality. 2. Chondrocalcinosis with early tricompartmental degenerative change and effusion, suggesting CPPD arthropathy. Electronically Signed   By: Lucrezia Europe M.D.   On: 06/25/2017 19:03   Recent Labs    06/26/17 0610 06/27/17 0750  WBC 9.5 7.6  HGB 12.4* 11.9*  HCT 38.8* 38.4*  PLT 187 200   Recent Labs    06/25/17 0342 06/26/17 0610  NA 135 133*  K 4.5 3.9  CL 97* 95*  GLUCOSE 126* 136*  BUN 17 18  CREATININE 0.96 0.95  CALCIUM 8.5* 8.5*   CBG (last 3)  No results for input(s): GLUCAP in the last 72 hours.  Wt Readings from Last 3 Encounters:  06/27/17 77.1 kg (170 lb 1.2 oz)  06/25/17 78.3 kg (172 lb 9.6 oz)  04/25/17 80.2 kg (176 lb 12.9 oz)    Physical Exam:  Constitutional: He is oriented to person, place, and time. He appears well-developed and well-nourished. No distress.  Multiple facial abrasions with scabs. Dependent ecchymosis on chin and down the neck.    Stable HENT:  Head: Normocephalic and atraumatic.  Mouth/Throat: Oropharynx is clear and moist.  Nasal bridge  with sutured abrasion.  No changes  Eyes: EOM are normal. Pupils are equal, round, and reactive to light.  Neck: Normal range of motion. Neck supple.  Cardiovascular: RRR without murmur. No JVD   Respiratory.  CTA Bilaterally without wheezes or rales. Normal effort   GI: Soft. Bowel sounds are normal. He exhibits no distension. There is no tenderness.  Musculoskeletal: He exhibits edema.  Edema with mild erythema right knee--question due to effusion. Keeps RLE eternally rotated and pain with attempts at ROM. 1+ pitting edema RLE> LLE  Neurological: Patient is much more alert.  Oriented to person.  Could not tell me he was at the hospital or why he was here.  Follows simple commands.  Distracted. A cranial nerve deficit is present.  HOH.   Motor: B/l UE 4/5 proximal to distal B/l LE: HF 2/5, KE 2+/5, ADF/PF 4+/5  -no motor changes Psych: Pleasant Uro: still with Blood-tinged urine in catheter and bag   Assessment/Plan: 1.  Gait disorder and cognitive deficits secondary to secondary to recent fall and likely traumatic brain injury as well as debility which require 3+ hours per day of interdisciplinary therapy in a comprehensive inpatient rehab setting. Physiatrist is providing close team supervision and 24 hour management of active medical problems listed below. Physiatrist and rehab team continue to assess barriers to discharge/monitor patient progress toward functional and medical goals.  Function:  Bathing Bathing position   Position: Shower  Bathing parts Body parts bathed by patient: Right arm,  Left arm, Chest, Abdomen, Right upper leg, Left upper leg Body parts bathed by helper: Front perineal area, Buttocks, Right lower leg, Left lower leg, Back  Bathing assist Assist Level: Touching or steadying assistance(Pt > 75%)      Upper Body Dressing/Undressing Upper body dressing   What is the patient wearing?: Pull over shirt/dress       Pull over shirt/dress - Perfomed by helper:  Thread/unthread right sleeve, Thread/unthread left sleeve, Put head through opening, Pull shirt over trunk        Upper body assist Assist Level: Touching or steadying assistance(Pt > 75%)      Lower Body Dressing/Undressing Lower body dressing   What is the patient wearing?: Pants, Socks, Shoes     Pants- Performed by patient: Thread/unthread right pants leg, Thread/unthread left pants leg Pants- Performed by helper: Pull pants up/down     Socks - Performed by patient: Don/doff right sock, Don/doff left sock   Shoes - Performed by patient: Don/doff right shoe, Don/doff left shoe, Fasten right, Fasten left            Lower body assist Assist for lower body dressing: Touching or steadying assistance (Pt > 75%)      Toileting Toileting     Toileting steps completed by helper: (P) Adjust clothing prior to toileting, Performs perineal hygiene, Adjust clothing after toileting Toileting Assistive Devices: (P) Grab bar or rail  Toileting assist Assist level: (P) Touching or steadying assistance (Pt.75%)   Transfers Chair/bed transfer   Chair/bed transfer method: Stand pivot Chair/bed transfer assist level: Moderate assist (Pt 50 - 74%/lift or lower) Chair/bed transfer assistive device: Armrests     Locomotion Ambulation     Max distance: 155 ft Assist level: 2 helpers(mod assist, w/c follow for safety)   Wheelchair Wheelchair activity did not occur: Safety/medical concerns Type: Manual Max wheelchair distance: 150 ft Assist Level: Touching or steadying assistance (Pt > 75%)  Cognition Comprehension Comprehension assist level: Understands basic 50 - 74% of the time/ requires cueing 25 - 49% of the time  Expression Expression assist level: Expresses basic 50 - 74% of the time/requires cueing 25 - 49% of the time. Needs to repeat parts of sentences.  Social Interaction Social Interaction assist level: Interacts appropriately 75 - 89% of the time - Needs redirection for  appropriate language or to initiate interaction.  Problem Solving Problem solving assist level: Solves basic 50 - 74% of the time/requires cueing 25 - 49% of the time  Memory Memory assist level: Recognizes or recalls 25 - 49% of the time/requires cueing 50 - 75% of the time   Medical Problem List and Plan: 1.  Deficits in mobility and self-care secondary to debility and TBI,.   -Beginning therapies today   -Had discussion with wife who was present today.  She vehemently denied that her husband suffered from dementia leading up to this admission.  I did not push the matter too much as she had some concerns regarding his care overnight.  Team will continue to discuss and observe moving forward.  I did mention to his wife that we will need to work on reestablishing his sleep first and foremost 2.  DVT Prophylaxis/Anticoagulation: Pharmaceutical: Other (comment)--on Xarelto.  3. Pain Management: continue ultram prn 4. Mood: Ego support 5. Neuropsych: This patient is? Not  capable of making decisions on his own behalf.   -  scheduled Seroquel 25 mg at 9 PM each night effective thus far   -Suspect some  baseline deficits 6. Skin/Wound Care: pressure relief measures.  Remove sutures from nose later this week or early next week 7. Fluids/Electrolytes/Nutrition: Encourage p.o. intake.  I thoroughly reviewed patient's labs today.  Need to bruised nutrition and protein based on low albumin.   -Added supplement    -RD follow up 8. A fib with RVR: on amiodarone and metoprolol. Has resolved --started on lanoxin today and Cardizem titrated upwards to 60 mg tid. Monitor for orthostatic symptoms.  9.  Urinary retention: Continue foley for now. Monitor hematuria --on Eliquis as well as H/H.  Pending UA/UCS.    -Continue Avodart and Flomax     -Dc foley?  D/w team 10. Bilateral Knee OA: Right knee x-ray reviewed and shows mild arthritis as well as chondrocalcinosis.  Will observe for range of motion and  weightbearing tolerance with therapies 11. Dizziness: Likely due to A flutter. Will check orthostatic vitals. 12. Abnormal LFTs: Likely due to shocked liver and resolving hematoma.    -LFTs reviewed   and essentially within normal limits  LOS (Days) 2 A FACE TO FACE EVALUATION WAS PERFORMED  Meredith Staggers, MD 06/27/2017 9:18 AM

## 2017-06-27 NOTE — Patient Care Conference (Addendum)
Inpatient RehabilitationTeam Conference and Plan of Care Update Date: 06/26/2017   Time: 2:50 PM    Patient Name: Jeremy Macdonald Record Number: [id number]  Date of Birth: 23-Apr-1926 Sex: Male         Room/Bed: 4W13C/4W13C-01 Payor Info: Payor: MEDICARE / Plan: MEDICARE PART A AND B / Product Type: *No Product type* /    Admitting Diagnosis: Fall, Debility  Admit Date/Time:  06/25/2017  4:15 PM Admission Comments: No comment available   Primary Diagnosis:  <principal problem not specified> Principal Problem: <principal problem not specified>  Patient Active Problem List   Diagnosis Date Noted  . Debility 06/25/2017  . Chronic obstructive pulmonary disease ([place])   . Bilateral hearing loss   . Leukocytosis   . Gross hematuria   . Acute blood loss anemia   . Hypoalbuminemia   . Malnutrition of moderate degree 06/24/2017  . Dementia 06/21/2017  . Hyperglycemia 06/21/2017  . BPH (benign prostatic hyperplasia) 06/21/2017  . Dizziness 06/21/2017  . Atrial fibrillation with rapid ventricular response ([place]) 06/20/2017  . Atrial fibrillation ([place]) 06/20/2017  . Post PTCA 04/24/2017  . CAD (coronary artery disease), native coronary artery 04/22/2017  . Angina pectoris ([place]) 04/22/2017  . PAF (paroxysmal atrial fibrillation) ([place]) 03/18/2017  . Abnormal nuclear stress test 03/18/2017    Expected Discharge Date: Expected Discharge Date: (2-3 weeks)  Team Members Present: Physician leading conference: Dr. Alger Simons Social Worker Present: Lennart Pall, LCSW Nurse Present: Rozetta Nunnery, RN PT Present: Lavone Nian, PT OT Present: Willeen Cass, OT SLP Present: Weston Anna, SLP PPS Coordinator present : Daiva Nakayama, RN, CRRN     Current Status/Progress Goal Weekly Team Focus  Medical   admitted for debility, afib with rvr, urine retention  improve activity tolerance  hr control,  bladder emptying   Bowel/Bladder   continent of bowel, indewlling 63fr coude  catheter for bladder retention  remain continent of bowel, prevent UTI  continue catheter care/peri care, educate patient about sighs/symptoms of UTI   Swallow/Nutrition/ Hydration             ADL's   mod - max A for bathing at shower level and dressing sit to stand   supervision   orientation, sit to stand, standing balance, cognitive remediation   Mobility   min<>mod assist transfers & gait with HHA & RW, AxO to self  supervision<>min assist  transfers, gait, balance, pt/family education   Communication             Safety/Cognition/ Behavioral Observations  Max A  Min A  orientation, attention, problem solving    Pain   patient denies pain  pain less than or equal to 4/10 with min assist  assess for pain q4h and prn   Skin   scattered bruising on face, limbs and body  no new skin injury/breakdown   assess skin q shift and prn    Rehab Goals Patient on target to meet rehab goals: Yes *See Care Plan and progress notes for long and short-term goals.     Barriers to Discharge  Current Status/Progress Possible Resolutions Date Resolved   Physician    Medical stability        ongoing medical mgt      Nursing     HOH, Pt and spouse,             PT  Behavior  OT                  SLP                SW                Discharge Planning/Teaching Needs:  Wife able to provide 24/7 supervision.  Additional assist from son after work.  Teaching needs TBD   Team Discussion:  New admit;  Probable concussion.  Likely premorbid dementia.  Mod - max assist overall.  Son very concerned with pt's current cognitive deficits - MD to monitor  Revisions to Treatment Plan:  None    Continued Need for Acute Rehabilitation Level of Care: The patient requires daily medical management by a physician with specialized training in physical medicine and rehabilitation for the following conditions: Daily direction of a multidisciplinary physical rehabilitation program to ensure safe  treatment while eliciting the highest outcome that is of practical value to the patient.: Yes Daily medical management of patient stability for increased activity during participation in an intensive rehabilitation regime.: Yes Daily analysis of laboratory values and/or radiology reports with any subsequent need for medication adjustment of medical intervention for : Cardiac problems;Urological problems  Vladislav Axelson 06/28/2017, 10:59 AM

## 2017-06-28 ENCOUNTER — Inpatient Hospital Stay (HOSPITAL_COMMUNITY): Payer: Medicare Other | Admitting: Physical Therapy

## 2017-06-28 ENCOUNTER — Inpatient Hospital Stay (HOSPITAL_COMMUNITY): Payer: Medicare Other | Admitting: Occupational Therapy

## 2017-06-28 ENCOUNTER — Inpatient Hospital Stay (HOSPITAL_COMMUNITY): Payer: Medicare Other | Admitting: Speech Pathology

## 2017-06-28 LAB — CBC
HCT: 35.7 % — ABNORMAL LOW (ref 39.0–52.0)
HEMOGLOBIN: 11.2 g/dL — AB (ref 13.0–17.0)
MCH: 30.4 pg (ref 26.0–34.0)
MCHC: 31.4 g/dL (ref 30.0–36.0)
MCV: 96.7 fL (ref 78.0–100.0)
PLATELETS: 189 10*3/uL (ref 150–400)
RBC: 3.69 MIL/uL — AB (ref 4.22–5.81)
RDW: 14.7 % (ref 11.5–15.5)
WBC: 7.6 10*3/uL (ref 4.0–10.5)

## 2017-06-28 LAB — BASIC METABOLIC PANEL
ANION GAP: 10 (ref 5–15)
BUN: 15 mg/dL (ref 6–20)
CHLORIDE: 101 mmol/L (ref 101–111)
CO2: 25 mmol/L (ref 22–32)
Calcium: 8.2 mg/dL — ABNORMAL LOW (ref 8.9–10.3)
Creatinine, Ser: 0.88 mg/dL (ref 0.61–1.24)
GFR calc Af Amer: >60 mL/min (ref >60)
Glucose, Bld: 115 mg/dL — ABNORMAL HIGH (ref 65–99)
POTASSIUM: 3.7 mmol/L (ref 3.5–5.1)
SODIUM: 136 mmol/L (ref 135–145)

## 2017-06-28 MED ORDER — QUETIAPINE FUMARATE 25 MG PO TABS
25.0000 mg | ORAL_TABLET | Freq: Two times a day (BID) | ORAL | Status: DC | PRN
  Administered 2017-06-28: 25 mg via ORAL
  Filled 2017-06-28 (×2): qty 1

## 2017-06-28 MED ORDER — QUETIAPINE FUMARATE 25 MG PO TABS
25.0000 mg | ORAL_TABLET | Freq: Every day | ORAL | Status: DC
  Administered 2017-06-28 – 2017-07-09 (×12): 25 mg via ORAL
  Filled 2017-06-28 (×12): qty 1

## 2017-06-28 MED ORDER — LIDOCAINE HCL 2 % EX GEL
CUTANEOUS | Status: DC | PRN
  Administered 2017-06-29: 1 via TOPICAL
  Administered 2017-06-30: 5 via TOPICAL
  Filled 2017-06-28 (×8): qty 5

## 2017-06-28 NOTE — Progress Notes (Signed)
Social Work  Social Work Assessment and Plan  Patient Details  Name: Jeremy Macdonald MRN: [id number] Date of Birth: February 26, 1926  Today's Date: 06/28/2017  Problem List:  Patient Active Problem List   Diagnosis Date Noted  . Debility 06/25/2017  . Chronic obstructive pulmonary disease ([place])   . Bilateral hearing loss   . Leukocytosis   . Gross hematuria   . Acute blood loss anemia   . Hypoalbuminemia   . Malnutrition of moderate degree 06/24/2017  . Dementia 06/21/2017  . Hyperglycemia 06/21/2017  . BPH (benign prostatic hyperplasia) 06/21/2017  . Dizziness 06/21/2017  . Atrial fibrillation with rapid ventricular response ([place]) 06/20/2017  . Atrial fibrillation ([place]) 06/20/2017  . Post PTCA 04/24/2017  . CAD (coronary artery disease), native coronary artery 04/22/2017  . Angina pectoris ([place]) 04/22/2017  . PAF (paroxysmal atrial fibrillation) ([place]) 03/18/2017  . Abnormal nuclear stress test 03/18/2017   Past Medical History:  Past Medical History:  Diagnosis Date  . Abdominal aortic aneurysm (AAA) ([place])   . Arthritis    "shoulders" (04/24/2017)  . Atrial fibrillation with RVR ([place]) 03/2017   [name] 03/08/2017  . COPD (chronic obstructive pulmonary disease) ([place])   . Coronary artery disease   . Dementia   . Fall at home 06/17/2017   nasal fracture  . High cholesterol   . HOH (hard of hearing)   . Hypertension   . Pneumonia ~ 2003-2008 X 5   "5 straight years S/P pneumonia shot in ~ 2003"(04/24/2017)  . Skin cancer 2018   "cut it out of right arm"   Past Surgical History:  Past Surgical History:  Procedure Laterality Date  . APPENDECTOMY    . CARDIAC CATHETERIZATION    . CATARACT EXTRACTION, BILATERAL Bilateral   . CORONARY ANGIOPLASTY WITH STENT PLACEMENT    . CORONARY ATHERECTOMY N/A 04/24/2017   Procedure: CORONARY ATHERECTOMY;  Surgeon: Adrian Prows, MD;  Location: [facility];  Service: Cardiovascular;  Laterality: N/A;  . CORONARY STENT  INTERVENTION N/A 04/24/2017   Procedure: CORONARY STENT INTERVENTION;  Surgeon: Adrian Prows, MD;  Location: [facility];  Service: Cardiovascular;  Laterality: N/A;  . INCISION AND DRAINAGE  1950s   "RLE; got hurt in service; had to get a bunch of stuff out"  . LEFT HEART CATH AND CORONARY ANGIOGRAPHY N/A 03/19/2017   Procedure: LEFT HEART CATH AND CORONARY ANGIOGRAPHY;  Surgeon: Adrian Prows, MD;  Location: [facility];  Service: Cardiovascular;  Laterality: N/A;  . SKIN CANCER EXCISION Right 2018   "arms"  . TOOTH EXTRACTION     Social History:  reports that he has been smoking cigarettes.  He has a 6.50 pack-year smoking history. He has quit using smokeless tobacco. He reports that he does not drink alcohol or use drugs.  Family / Support Systems Marital Status: Married How Long?: 61 yrs Patient Roles: Spouse Spouse/Significant Other: wife, Apolinar Bero @ (C) [phone] Children: Pt and wife have 3 adult children (plus one daughter deceased) with two living locally.  Son, [name], is able to provide intermittent support. Anticipated Caregiver: wife 24/7, [name], son after work daily Ability/Limitations of Caregiver: wife can provide supervision level Caregiver Availability: 24/7 Family Dynamics: Patient at bedside and very encouraging and supportive of patient.  She reports that she will be primary support at discharge.  Social History Preferred language: English Religion: Quaker Cultural Background: NA Education: Passenger transport manager Read: Yes Write: Yes Employment Status: Retired Date Retired/Disabled/Unemployed: Patient actually continued to work  after his initial retirement until age 47 years. Legal Hisotry/Current Legal Issues: None Guardian/Conservator: None-Per MD, patient not capable of making decisions on his own behalf-defer to spouse   Abuse/Neglect Abuse/Neglect Assessment Can Be Completed: Yes Physical Abuse: Denies Verbal Abuse: Denies Sexual Abuse:  Denies Exploitation of patient/patient's resources: Denies Self-Neglect: Denies  Emotional Status Pt's affect, behavior adn adjustment status: Elderly gentleman sitting up in his wheelchair at bedside and able to answer basic, personal information questions.  He does offer his opinions about the rehab program and hopes to be ready to go home as soon as possible.  Patient denies any significant emotional distress.  Wife and therapy's report cognitive impairment.  Will likely benefit from neuropsychology consult. Recent Psychosocial Issues: None Pyschiatric History: None Substance Abuse History: None  Patient / Family Perceptions, Expectations & Goals Pt/Family understanding of illness & functional limitations: Patient and wife with basic understanding of injuries he suffered in his fall at home.  Wife's understanding is that cognitive impairment is related to a concussion suffered in the fall. Premorbid pt/family roles/activities: Patient was completely independent PTA and still driving.  Wife reports no cognitive deficits noted prior to this fall. Anticipated changes in roles/activities/participation: Team anticipates patient will require 24/7 supervision and wife to assume primary caregiver support role Pt/family expectations/goals: Wife states that she is hopeful patient will reach supervision level as she does not feel she can offer much physical assistance.  Community Resources [employer]: None Premorbid Home Care/DME Agencies: None Transportation available at discharge: Yes Resource referrals recommended: Neuropsychology  Discharge Planning Living Arrangements: Spouse/significant other Support Systems: Children, Spouse/significant other Type of Residence: Private residence Insurance underwriter Resources: [employer], Multimedia programmer (specify)(BC [employer]) Financial Resources: Radio broadcast assistant Screen Referred: No Living Expenses: Own Money Management: Patient Does the patient have  any problems obtaining your medications?: No Home Management: Patient and wife share home responsibilities Patient/Family Preliminary Plans: Patient to return home with elderly wife who can provide supervision. Social Work Anticipated Follow Up Needs: HH/OP Expected length of stay: 2-3 weeks  Clinical Impression Unfortunate, elderly gentleman here after having a fall at home and suffering a mild TBI.  On CIR to address cognitive and endurance deficits.  Wife at bedside and very supportive and notes she is hopeful he will reach a supervision level as she does not feel she can provide much physical assistance.  Will follow for ongoing support and discharge planning needs  Equilla Que 06/28/2017, 3:13 PM

## 2017-06-28 NOTE — Progress Notes (Signed)
Occupational Therapy Session Note  Patient Details  Name: Jeremy Macdonald MRN: [id number] Date of Birth: December 28, 1925  Today's Date: 06/28/2017 OT Individual Time: 2947-6546 OT Individual Time Calculation (min): 70 min    Short Term Goals: Week 1:  OT Short Term Goal 1 (Week 1): Pt will transfer with BSC/toilet with min A with LRAD OT Short Term Goal 2 (Week 1): Pt will bathe 10/10 parts with min A sit to stand OT Short Term Goal 3 (Week 1): Pt will be oriented x4 with min environmental cues OT Short Term Goal 4 (Week 1): Pt will dress UB with setup   Skilled Therapeutic Interventions/Progress Updates:    Upon entering the room, pt supine in bed with wife present in room. Pt agreeable to OT intervention this session. Pt required mod A to stand from elevated bed height with max multimodal cues for hand placement. Pt transferred from bed >wheelchair with mod and use of RW. OT attempting to transfer pt from wheelchair > TTB but unable during this session. Pt stating, " I'm too weak". Pt placed in front of sink for bathing and dressing. Pt bathing UB with mod multimodal for sequencing and initiation. Pt standing from wheelchair with max lifting assistance. Pt able to stand for 45 sec- 1 min x 2 reps for hygiene and LB clothing management. Pt required total A for LB clothing management. Pt oriented to self and situation only this session requiring max A for orientation to time and location. Pt remained seated in wheelchair with quick release belt donned and wife present in room.   Therapy Documentation Precautions:  Precautions Precautions: Fall Precaution Comments: monitor HR Restrictions Weight Bearing Restrictions: No ADL: ADL ADL Comments: see functional navigator  See Function Navigator for Current Functional Status.   Therapy/Group: Individual Therapy  Gypsy Decant 06/28/2017, 12:41 PM

## 2017-06-28 NOTE — IPOC Note (Signed)
Overall Plan of Care [facility]) Patient Details Name: Jeremy Macdonald MRN: [id number] DOB: 1926/03/12  Admitting Diagnosis: <principal problem not specified> TBI, polytrauma, debility  Hospital Problems: Active Problems:   Debility     Functional Problem List: Nursing Bladder, Sensory, Skin Integrity, Edema, Medication Management, Motor  PT Balance, Safety, Behavior, Endurance, Motor, Pain, Skin Integrity  OT Balance, Behavior, Safety, Cognition, Perception, Edema, Endurance, Motor, Nutrition, Pain  SLP Cognition  TR Edema, Endurance, Motor, Safety, Skin Integrity, Pain       Basic ADL's: OT Grooming, Bathing, Dressing, Toileting     Advanced  ADL's: OT       Transfers: PT Bed Mobility, Bed to Chair, Car, Manufacturing systems engineer, Metallurgist: PT Ambulation, Emergency planning/management officer, Stairs     Additional Impairments: OT None  SLP Social Cognition   Social Interaction, Problem Solving, Memory, Attention, Awareness  TR      Anticipated Outcomes Item Anticipated Outcome  Self Feeding n/a  Swallowing      Basic self-care  supervision  Toileting  supervision   Bathroom Transfers supervision  Bowel/Bladder  pt will wemain continent of bowel while utilizing appropriate toileting device   Transfers  supervision with LRAD  Locomotion  supervision<>min assist with LRAD  Communication     Cognition  Min A   Pain  have adequately controled pain with prescribed pain regimine   Safety/Judgment  call appropriately, up with 1 assist   Therapy Plan: PT Intensity: Minimum of 1-2 x/day ,45 to 90 minutes PT Frequency: 5 out of 7 days PT Duration Estimated Length of Stay: 2 weeks OT Intensity: Minimum of 1-2 x/day, 45 to 90 minutes OT Frequency: 5 out of 7 days OT Duration/Estimated Length of Stay: ~12-14 days SLP Intensity: Minumum of 1-2 x/day, 30 to 90 minutes SLP Frequency: 3 to 5 out of 7 days SLP Duration/Estimated Length of Stay: 2-3 weeks     Team Interventions: Nursing Interventions Patient/Family Education, Bladder Management, Medication Management, Bowel Management, Psychosocial Support, Discharge Planning, Pain Management, Cognitive Remediation/Compensation, Skin Care/Wound Management  PT interventions Ambulation/gait training, Community reintegration, DME/adaptive equipment instruction, Neuromuscular re-education, Psychosocial support, Stair training, UE/LE Strength taining/ROM, Wheelchair propulsion/positioning, UE/LE Coordination activities, Therapeutic Activities, Skin care/wound management, Pain management, Discharge planning, Training and development officer, Cognitive remediation/compensation, Disease management/prevention, Functional mobility training, Patient/family education, Therapeutic Exercise, Visual/perceptual remediation/compensation  OT Interventions Balance/vestibular training, Discharge planning, Functional electrical stimulation, Pain management, Self Care/advanced ADL retraining, Therapeutic Activities, UE/LE Coordination activities, Cognitive remediation/compensation, Disease mangement/prevention, Functional mobility training, Patient/family education, Skin care/wound managment, Therapeutic Exercise, Community reintegration, DME/adaptive equipment instruction, Neuromuscular re-education, Psychosocial support, Splinting/orthotics, UE/LE Strength taining/ROM  SLP Interventions Cognitive remediation/compensation, Environmental controls, Internal/external aids, Therapeutic Activities, Patient/family education, Functional tasks, Cueing hierarchy  TR Interventions    SW/CM Interventions Discharge Planning, Psychosocial Support, Patient/Family Education   Barriers to Discharge MD  Medical stability  Nursing   HOH, Pt and spouse,   PT Behavior    OT      SLP      SW       Team Discharge Planning: Destination: PT-Home ,OT- Home , SLP-Home Projected Follow-up: PT-24 hour supervision/assistance, Home health PT, OT-  Home  health OT, SLP-Home Health SLP, Outpatient SLP, 24 hour supervision/assistance Projected Equipment Needs: PT-Rolling walker with 5" wheels, OT- To be determined, SLP-None recommended by SLP Equipment Details: PT- , OT-  Patient/family involved in discharge planning: PT- Family member/caregiver, Patient,  OT-Patient, Family member/caregiver, SLP-Patient, Family member/caregiver  MD ELOS: 12-18 days  Medical Rehab Prognosis:  Good Assessment: The patient has been admitted for CIR therapies with the diagnosis of TBI/polytrauma/debility. The team will be addressing functional mobility, strength, stamina, balance, safety, adaptive techniques and equipment, self-care, bowel and bladder mgt, patient and caregiver education, neuromuscular reeducation, reestablishment of sleep wake cycle, behavior and cognitive management, pain control, communication. Goals have been set at supervision for basic mobility and self-care and supervision to minimal assistance with cognition.    Meredith Staggers, MD, FAAPMR      See Team Conference Notes for weekly updates to the plan of care

## 2017-06-28 NOTE — Progress Notes (Signed)
Speech Language Pathology Daily Session Note  Patient Details  Name: KORTNEY SCHOENFELDER MRN: [id number] Date of Birth: March 16, 1926  Today's Date: 06/28/2017 SLP Individual Time: 1345-1430 SLP Individual Time Calculation (min): 45 min  Short Term Goals: Week 1: SLP Short Term Goal 1 (Week 1): Patient will demonstrate functional problem solving for basic and familiar tasks with Mod A verbal cues.  SLP Short Term Goal 2 (Week 1): Patient will demonstrate sustained attention to functional tasks for ~10 minutes with Mod A verbal cues for redirection.  SLP Short Term Goal 3 (Week 1): Patient will demonstrate orientation to time and place with Mod A multimodal cues.  SLP Short Term Goal 4 (Week 1): Patient will identify 2 cognitive and 2 physical changes since admission with Max A multimodal cues.   Skilled Therapeutic Interventions: Skilled treatment session focused on cognitive goals. SLP facilitated session by providing Max-Total A multimodal cues for use of external aids for orientation. Patient appeared more confused this session and required Max verbal cues to attend to tasks instead of perseverating on foot placement on floor. Patient left upright in wheelchair with RN present. Continue with current plan of care.       Function:   Cognition Comprehension Comprehension assist level: Understands basic 50 - 74% of the time/ requires cueing 25 - 49% of the time  Expression   Expression assist level: Expresses basic 50 - 74% of the time/requires cueing 25 - 49% of the time. Needs to repeat parts of sentences.  Social Interaction Social Interaction assist level: Interacts appropriately 75 - 89% of the time - Needs redirection for appropriate language or to initiate interaction.  Problem Solving Problem solving assist level: Solves basic 25 - 49% of the time - needs direction more than half the time to initiate, plan or complete simple activities  Memory Memory assist level: Recognizes or recalls  25 - 49% of the time/requires cueing 50 - 75% of the time    Pain No/Denies Pain   Therapy/Group: Individual Therapy  Neesa Knapik 06/28/2017, 3:14 PM

## 2017-06-28 NOTE — Progress Notes (Signed)
[facility] PHYSICAL MEDICINE & REHABILITATION     PROGRESS NOTE    Subjective/Complaints: Patient slept last night again but was very restless prior.  Wife states that he was trying to get out of the room.  ROS: Limited due to cognitive/behavioral   Objective: Vital Signs: Blood pressure (!) 95/43, pulse 85, temperature 98.4 F (36.9 C), temperature source Oral, resp. rate 18, height 5\' 9"  (1.753 m), weight 77 kg (169 lb 12.1 oz), SpO2 96 %. No results found. Recent Labs    06/27/17 0750 06/28/17 0557  WBC 7.6 7.6  HGB 11.9* 11.2*  HCT 38.4* 35.7*  PLT 200 189   Recent Labs    06/26/17 0610 06/28/17 0557  NA 133* 136  K 3.9 3.7  CL 95* 101  GLUCOSE 136* 115*  BUN 18 15  CREATININE 0.95 0.88  CALCIUM 8.5* 8.2*   CBG (last 3)  No results for input(s): GLUCAP in the last 72 hours.  Wt Readings from Last 3 Encounters:  06/28/17 77 kg (169 lb 12.1 oz)  06/25/17 78.3 kg (172 lb 9.6 oz)  04/25/17 80.2 kg (176 lb 12.9 oz)    Physical Exam:  Constitutional: He is oriented to person, place, and time. He appears well-developed and well-nourished. No distress.  Multiple facial abrasions with scabs. Dependent ecchymosis on chin and down the neck.    Stable HENT:  Head: Normocephalic and atraumatic.  Mouth/Throat: Oropharynx is clear and moist.  Nasal bridge with sutured abrasion.  No changes --stable Eyes: EOM are normal. Pupils are equal, round, and reactive to light.  Neck: Normal range of motion. Neck supple.  Cardiovascular: RRR without murmur. No JVD   Respiratory.  CTA Bilaterally without wheezes or rales. Normal effort   GI: Soft. Bowel sounds are normal. He exhibits no distension. There is no tenderness.  Musculoskeletal: He exhibits edema.    Some pain and swelling near the right knee  Neurological: Patient awoke easily.  Told me he was in [place] but not the city or place.  Could tell me I was a doctor but did not recall my name. HOH.   Motor: B/l UE  4/5 proximal to distal B/l LE: HF 2/5, KE 2+/5, ADF/PF 4+/5  -stable Psych: Pleasant Uro: still with Blood-tinged urine in catheter and bag   Assessment/Plan: 1.  Gait disorder and cognitive deficits secondary to secondary to recent fall and likely traumatic brain injury as well as debility which require 3+ hours per day of interdisciplinary therapy in a comprehensive inpatient rehab setting. Physiatrist is providing close team supervision and 24 hour management of active medical problems listed below. Physiatrist and rehab team continue to assess barriers to discharge/monitor patient progress toward functional and medical goals.  Function:  Bathing Bathing position   Position: Shower  Bathing parts Body parts bathed by patient: Right arm, Left arm, Chest, Abdomen, Right upper leg, Left upper leg Body parts bathed by helper: Front perineal area, Buttocks, Right lower leg, Left lower leg, Back  Bathing assist Assist Level: Touching or steadying assistance(Pt > 75%)      Upper Body Dressing/Undressing Upper body dressing   What is the patient wearing?: Pull over shirt/dress, Button up shirt     Pull over shirt/dress - Perfomed by patient: Thread/unthread left sleeve, Put head through opening, Pull shirt over trunk Pull over shirt/dress - Perfomed by helper: Thread/unthread right sleeve Button up shirt - Perfomed by patient: Thread/unthread right sleeve Button up shirt - Perfomed by helper: Thread/unthread left  sleeve, Pull shirt around back, Button/unbutton shirt    Upper body assist Assist Level: Touching or steadying assistance(Pt > 75%)      Lower Body Dressing/Undressing Lower body dressing   What is the patient wearing?: Pants, Socks, Shoes     Pants- Performed by patient: Thread/unthread right pants leg, Thread/unthread left pants leg Pants- Performed by helper: Pull pants up/down     Socks - Performed by patient: Don/doff right sock, Don/doff left sock   Shoes -  Performed by patient: Don/doff right shoe, Don/doff left shoe, Fasten right, Fasten left            Lower body assist Assist for lower body dressing: Touching or steadying assistance (Pt > 75%)      Toileting Toileting     Toileting steps completed by helper: (P) Adjust clothing prior to toileting, Performs perineal hygiene, Adjust clothing after toileting Toileting Assistive Devices: (P) Grab bar or rail  Toileting assist Assist level: (P) Touching or steadying assistance (Pt.75%)   Transfers Chair/bed transfer   Chair/bed transfer method: Stand pivot Chair/bed transfer assist level: Moderate assist (Pt 50 - 74%/lift or lower) Chair/bed transfer assistive device: Armrests, Medical sales representative     Max distance: 100 ft Assist level: Touching or steadying assistance (Pt > 75%)   Wheelchair Wheelchair activity did not occur: Safety/medical concerns Type: Manual Max wheelchair distance: 150 ft Assist Level: Touching or steadying assistance (Pt > 75%)  Cognition Comprehension Comprehension assist level: Understands basic 50 - 74% of the time/ requires cueing 25 - 49% of the time  Expression Expression assist level: Expresses basic 50 - 74% of the time/requires cueing 25 - 49% of the time. Needs to repeat parts of sentences.  Social Interaction Social Interaction assist level: Interacts appropriately 75 - 89% of the time - Needs redirection for appropriate language or to initiate interaction.  Problem Solving Problem solving assist level: Solves basic 50 - 74% of the time/requires cueing 25 - 49% of the time  Memory Memory assist level: Recognizes or recalls 25 - 49% of the time/requires cueing 50 - 75% of the time   Medical Problem List and Plan: 1.  Deficits in mobility and self-care secondary to debility and TBI,.   -Beginning therapies today   -Had discussion with wife who was present today.  She vehemently denied that her husband suffered from dementia leading  up to this admission.  I did not push the matter too much as she had some concerns regarding his care overnight.  Team will continue to discuss and observe moving forward.  I did mention to his wife that we will need to work on reestablishing his sleep first and foremost 2.  DVT Prophylaxis/Anticoagulation: Pharmaceutical: Other (comment)--on Xarelto.  3. Pain Management: continue ultram prn 4. Mood: Ego support 5. Neuropsych: This patient is? Not  capable of making decisions on his own behalf.   - will adjust Seroquel to 8 PM nightly.  Have a dose of Seroquel available as needed for severe agitation as well.  He appears to be sundowning and I suspect baseline deficits      6. Skin/Wound Care: pressure relief measures.  Remove sutures from nose later this week or early next week 7. Fluids/Electrolytes/Nutrition: Encourage p.o. intake.  I thoroughly reviewed patient's labs today.  Need to bruised nutrition and protein based on low albumin.   -Added supplement    -RD follow up 8. A fib with RVR: on amiodarone and metoprolol. Has  resolved --started on lanoxin today and Cardizem titrated upwards to 60 mg tid. Monitor for orthostatic symptoms.  9.  Urinary retention: Continue foley for now. Monitor hematuria --on Eliquis as well as H/H.  Pending UA/UCS.    -Continue Avodart and Flomax     -pt is ambulatory. Remove Foley and attempt voiding trial again 10. Bilateral Knee OA: Right knee x-ray reviewed and shows mild arthritis as well as chondrocalcinosis.    11. Dizziness: Likely due to A flutter. Will check orthostatic vitals. 12. Abnormal LFTs: Likely due to shocked liver and resolving hematoma.    -LFTs reviewed   and essentially within normal limits  LOS (Days) 3 A FACE TO FACE EVALUATION WAS PERFORMED  Meredith Staggers, MD 06/28/2017 8:52 AM

## 2017-06-28 NOTE — Care Management Note (Signed)
Inpatient [facility] Individual Statement of Services  Patient Name:  Jeremy Macdonald  Date:  06/28/2017  Welcome to the [facility].  Our goal is to provide you with an individualized program based on your diagnosis and situation, designed to meet your specific needs.  With this comprehensive rehabilitation program, you will be expected to participate in at least 3 hours of rehabilitation therapies Monday-Friday, with modified therapy programming on the weekends.  Your rehabilitation program will include the following services:  Physical Therapy (PT), Occupational Therapy (OT), Speech Therapy (ST), 24 hour per day rehabilitation nursing, Therapeutic Recreaction (TR), Neuropsychology, Case Management (Social Worker), Rehabilitation Medicine, Nutrition Services and Pharmacy Services  Weekly team conferences will be held on Tuesdays to discuss your progress.  Your Social Worker will talk with you frequently to get your input and to update you on team discussions.  Team conferences with you and your family in attendance may also be held.  Expected length of stay: 2 weeks    Overall anticipated outcome: supervision  Depending on your progress and recovery, your program may change. Your Social Worker will coordinate services and will keep you informed of any changes. Your Social Worker's name and contact numbers are listed  below.  The following services may also be recommended but are not provided by the [facility] will be made to provide these services after discharge if needed.  Arrangements include referral to agencies that provide these services.  Your insurance has been verified to be:  Medicare and [place] Your primary doctor is:  Jacelyn Grip  Pertinent information will be shared with your doctor and your insurance  company.  Social Worker:  [place], [place] or ([phone]   Information discussed with and copy given to patient by: Lennart Pall, 06/28/2017, 3:48 PM

## 2017-06-28 NOTE — Progress Notes (Addendum)
Physical Therapy Session Note  Patient Details  Name: Jeremy Macdonald MRN: [id number] Date of Birth: 1926/01/20  Today's Date: 06/28/2017 PT Individual Time: 1002-1030 and 1545-1600 PT Individual Time Calculation (min): 28 min and 15 min   Short Term Goals: Week 1:  PT Short Term Goal 1 (Week 1): Pt will negotiate 4 steps with B rails and mod assist. PT Short Term Goal 2 (Week 1): Pt will complete sit<>stand transfers with min assist. PT Short Term Goal 3 (Week 1): Pt will consistently complete stand pivot transfers with min assist. PT Short Term Goal 4 (Week 1): Pt will ambulate 75 ft with LRAD & min assist.  Skilled Therapeutic Interventions/Progress Updates:  Treatment 1: Pt received in bed with wife present for session. Pt grimacing and holding L shoulder occasionally with repositioning and rest breaks provided PRN. Pt consuming toast supine in bed and therapist educated him & wife on need to sit upright when consuming meals. Pt transferred supine>sitting EOB with HOB significantly elevated, bed rails, min assist for weight shifting to come to upright sitting with max multimodal cuing for sequencing & technique. Observed pt to be bleeding from penis & RN made aware reporting this is normal as he just had his catheter removed. Therapist provided max<>total assist for donning socks, brief and pants. Pt completed sit>Stand from elevated bed height with max cuing for hand placement and min assist overall. Pt transferred to w/c via ambulation x 5 ft with min assist and RW. SpO2 = 95%, HR = 88 bpm. Pt reported need to use restroom & transferred to standing with mod assist from low w/c. Once in standing pt reported dizziness and returned to sitting. RN & NT made aware of pt's complaints and pt left in care of NT.  Treatment 2: Pt received in bed with granddaughter present. Pt grimacing in pain & holding shoulder with movement & meds requested from RN. Pt reporting significant fatigue but agreeable  to transferring to recliner. Pt completed supine>sitting EOB with min assist, bed rails, HOB elevated and max cuing to utilize rails but still holding to therapist's hand. Pt completed sit>stand from elevated bed with min assist and transferred to recliner via stand pivot. Pt with improving eccentric control with stand>sit with max cuing. At end of session pt left sitting in recliner with BLE elevated, QRB donned, and RN & granddaughter present. Pt missed 45 minutes skilled PT treatment 2/2 fatigue.  Therapy Documentation Precautions:  Precautions Precautions: Fall Precaution Comments: monitor HR Restrictions Weight Bearing Restrictions: No   General: PT Amount of Missed Time (min): 45 Minutes PT Missed Treatment Reason: Patient fatigue    See Function Navigator for Current Functional Status.   Therapy/Group: Individual Therapy  [name] 06/28/2017, 4:17 PM

## 2017-06-28 NOTE — Progress Notes (Signed)
Social Work Patient ID: Jeremy Macdonald, male   DOB: [birth year], 84 y.o.   MRN: [id number]  Met with pt and wife yesterday to review team conference.  Both aware that team has set ELOS of 2 - 3 weeks and of supervision goals.  Pt hopeful his LOS will be "much shorter than that...".  Wife more encouraged yesterday as she felt pt's cognition was somewhat clearer.  She is able to provide the 24/7 supervision at d/c. Will continue to follow.  Fuquan Wilson, LCSW

## 2017-06-29 ENCOUNTER — Inpatient Hospital Stay (HOSPITAL_COMMUNITY): Payer: Medicare Other | Admitting: Physical Therapy

## 2017-06-29 ENCOUNTER — Inpatient Hospital Stay (HOSPITAL_COMMUNITY): Payer: Medicare Other | Admitting: Speech Pathology

## 2017-06-29 ENCOUNTER — Inpatient Hospital Stay (HOSPITAL_COMMUNITY): Payer: Medicare Other

## 2017-06-29 ENCOUNTER — Inpatient Hospital Stay (HOSPITAL_COMMUNITY): Payer: Medicare Other | Admitting: Occupational Therapy

## 2017-06-29 LAB — URINALYSIS, ROUTINE W REFLEX MICROSCOPIC
Bilirubin Urine: NEGATIVE
Glucose, UA: NEGATIVE mg/dL
Ketones, ur: NEGATIVE mg/dL
Leukocytes, UA: NEGATIVE
Nitrite: NEGATIVE
PROTEIN: 30 mg/dL — AB
SPECIFIC GRAVITY, URINE: 1.02 (ref 1.005–1.030)
SQUAMOUS EPITHELIAL / LPF: NONE SEEN
pH: 6 (ref 5.0–8.0)

## 2017-06-29 LAB — CBC
HEMATOCRIT: 34.8 % — AB (ref 39.0–52.0)
Hemoglobin: 11.2 g/dL — ABNORMAL LOW (ref 13.0–17.0)
MCH: 31 pg (ref 26.0–34.0)
MCHC: 32.2 g/dL (ref 30.0–36.0)
MCV: 96.4 fL (ref 78.0–100.0)
Platelets: 190 10*3/uL (ref 150–400)
RBC: 3.61 MIL/uL — AB (ref 4.22–5.81)
RDW: 14.7 % (ref 11.5–15.5)
WBC: 8.3 10*3/uL (ref 4.0–10.5)

## 2017-06-29 NOTE — Progress Notes (Signed)
[facility] PHYSICAL MEDICINE & REHABILITATION     PROGRESS NOTE    Subjective/Complaints: Had a much better night.  Was able to sleep the majority of the night.  No agitated behavior   ROS: pt denies nausea, vomiting, diarrhea, cough, shortness of breath or chest pain   Objective: Vital Signs: Blood pressure (!) 121/55, pulse 84, temperature 98.3 F (36.8 C), temperature source Oral, resp. rate 16, height 5\' 9"  (1.753 m), weight 77 kg (169 lb 12.1 oz), SpO2 98 %. No results found. Recent Labs    06/27/17 0750 06/28/17 0557  WBC 7.6 7.6  HGB 11.9* 11.2*  HCT 38.4* 35.7*  PLT 200 189   Recent Labs    06/28/17 0557  NA 136  K 3.7  CL 101  GLUCOSE 115*  BUN 15  CREATININE 0.88  CALCIUM 8.2*   CBG (last 3)  No results for input(s): GLUCAP in the last 72 hours.  Wt Readings from Last 3 Encounters:  06/28/17 77 kg (169 lb 12.1 oz)  06/25/17 78.3 kg (172 lb 9.6 oz)  04/25/17 80.2 kg (176 lb 12.9 oz)    Physical Exam:  Constitutional: He is oriented to person, place, and time. He appears well-developed and well-nourished. No distress.  Multiple facial abrasions with scabs. Dependent ecchymosis on chin and down the neck.    Stable HENT:  Head: Normocephalic and atraumatic.  Mouth/Throat: Oropharynx is clear and moist.  Nasal bridge with abrasion and sutures Eyes: EOM are normal. Pupils are equal, round, and reactive to light.  Neck: Normal range of motion. Neck supple.  Cardiovascular: RRR without murmur. No JVD    Respiratory.  CTA Bilaterally without wheezes or rales. Normal effort  GI: Soft. Bowel sounds are normal. He exhibits no distension. There is no tenderness.  Musculoskeletal: He exhibits edema.    Some pain and swelling near the right knee  Neurological: Much more attentive.  Still not oriented to place or reason here.  Follows directions easily. HOH.   Motor: B/l UE 4/5 proximal to distal B/l LE: HF 2/5, KE 2+/5, ADF/PF 4+/5  -stable Psych:  Pleasant Uro: Foley out   Assessment/Plan: 1.  Gait disorder and cognitive deficits secondary to secondary to recent fall and likely traumatic brain injury as well as debility which require 3+ hours per day of interdisciplinary therapy in a comprehensive inpatient rehab setting. Physiatrist is providing close team supervision and 24 hour management of active medical problems listed below. Physiatrist and rehab team continue to assess barriers to discharge/monitor patient progress toward functional and medical goals.  Function:  Bathing Bathing position   Position: Wheelchair/chair at sink  Bathing parts Body parts bathed by patient: Right arm, Left arm, Chest, Abdomen, Right upper leg, Left upper leg Body parts bathed by helper: Front perineal area, Buttocks, Right lower leg, Left lower leg, Back  Bathing assist Assist Level: (mod A)      Upper Body Dressing/Undressing Upper body dressing   What is the patient wearing?: Button up shirt     Pull over shirt/dress - Perfomed by patient: Thread/unthread left sleeve, Put head through opening, Pull shirt over trunk Pull over shirt/dress - Perfomed by helper: Thread/unthread right sleeve Button up shirt - Perfomed by patient: Thread/unthread right sleeve Button up shirt - Perfomed by helper: Thread/unthread left sleeve, Pull shirt around back, Button/unbutton shirt    Upper body assist Assist Level: (max A)      Lower Body Dressing/Undressing Lower body dressing   What is the  patient wearing?: Pants, Non-skid slipper socks, Underwear   Underwear - Performed by helper: Thread/unthread right underwear leg, Thread/unthread left underwear leg, Pull underwear up/down Pants- Performed by patient: Thread/unthread right pants leg, Thread/unthread left pants leg Pants- Performed by helper: Pull pants up/down, Thread/unthread right pants leg, Thread/unthread left pants leg   Non-skid slipper socks- Performed by helper: Don/doff right sock,  Don/doff left sock Socks - Performed by patient: Don/doff right sock, Don/doff left sock   Shoes - Performed by patient: Don/doff right shoe, Don/doff left shoe, Fasten right, Fasten left            Lower body assist Assist for lower body dressing: (total)      Toileting Toileting   Toileting steps completed by patient: Performs perineal hygiene Toileting steps completed by helper: Adjust clothing prior to toileting, Adjust clothing after toileting(per Berkley Harvey, NT) Toileting Assistive Devices: Grab bar or rail  Toileting assist Assist level: Two helpers   Transfers Chair/bed transfer   Chair/bed transfer method: Stand pivot Chair/bed transfer assist level: Touching or steadying assistance (Pt > 75%) Chair/bed transfer assistive device: Medical sales representative     Max distance: 100 ft Assist level: Touching or steadying assistance (Pt > 75%)   Wheelchair Wheelchair activity did not occur: Safety/medical concerns Type: Manual Max wheelchair distance: 150 ft Assist Level: Touching or steadying assistance (Pt > 75%)  Cognition Comprehension Comprehension assist level: Understands basic 50 - 74% of the time/ requires cueing 25 - 49% of the time  Expression Expression assist level: Expresses basic 50 - 74% of the time/requires cueing 25 - 49% of the time. Needs to repeat parts of sentences.  Social Interaction Social Interaction assist level: Interacts appropriately 75 - 89% of the time - Needs redirection for appropriate language or to initiate interaction.  Problem Solving Problem solving assist level: Solves basic 25 - 49% of the time - needs direction more than half the time to initiate, plan or complete simple activities  Memory Memory assist level: Recognizes or recalls 25 - 49% of the time/requires cueing 50 - 75% of the time   Medical Problem List and Plan: 1.  Deficits in mobility and self-care secondary to debility and TBI,.   -Continue PT, OT, ST  2.   DVT Prophylaxis/Anticoagulation: --on Xarelto.  3. Pain Management: continue ultram prn--- limit to avoid neuro sedating side effects 4. Mood: Ego support 5. Neuropsych: This patient is? Not  capable of making decisions on his own behalf.   -Adjusted Seroquel to 8 PM nightly for sleep and sundowning    -PRN Seroquel available as needed for severe agitation as well.       6. Skin/Wound Care: pressure relief measures.  R   -Remove sutures today from nose  7. Fluids/Electrolytes/Nutrition: Encourage p.o. intake.  I thoroughly reviewed patient's labs today.  Need to bruised nutrition and protein based on low albumin.   -Continue protein supplement      8. A fib with RVR: on amiodarone and metoprolol. Has resolved --started on lanoxin today and Cardizem titrated upwards to 60 mg tid. Monitor for orthostatic symptoms.  9.  Urinary retention:    -Continue Avodart and Flomax     -Foley removed.  First PVR 200 cc.  Continue voiding trial and attempt to optimize emptying by having patient on the toilet or bedside commode 10. Bilateral Knee OA: Right knee x-ray reviewed and shows mild arthritis as well as chondrocalcinosis.    11. Dizziness: Likely  due to A flutter. Will check orthostatic vitals. 12. Abnormal LFTs: Likely due to shocked liver and resolving hematoma.    -LFTs now essentially within normal limits  LOS (Days) 4 A FACE TO FACE EVALUATION WAS PERFORMED  Meredith Staggers, MD 06/29/2017 9:19 AM

## 2017-06-29 NOTE — Progress Notes (Signed)
Occupational Therapy Session Note  Patient Details  Name: Jeremy Macdonald MRN: [id number] Date of Birth: [birth year]  Today's Date: 06/29/2017 OT Individual Time: 6222-9798 OT Individual Time Calculation (min): 56 min    Short Term Goals: Week 1:  OT Short Term Goal 1 (Week 1): Pt will transfer with BSC/toilet with min A with LRAD OT Short Term Goal 2 (Week 1): Pt will bathe 10/10 parts with min A sit to stand OT Short Term Goal 3 (Week 1): Pt will be oriented x4 with min environmental cues OT Short Term Goal 4 (Week 1): Pt will dress UB with setup   Skilled Therapeutic Interventions/Progress Updates:    1:1 wife present at beginning of session. Pt reporting pain in neck. RN aware and ordering k-pad. Pt oriented to self and situation but requires external cues and max A for time and place. Pt stand pivot transfer with MAX A for power iup and pt unable to assume fully erect posture. Pt washes UB at sink with A to wash back. Pt dons shirt with supervision and pants with total A for time manamgent. Pt sit to stand with MAX A from low w/c with +2 to wash buttocks and advance pants past hips. Pt grooms at sink with VC for locating items on sink. Pt able to assume seated figure 4 with sueing and touching A to doff/don socks. Exited session with pt seated in w/c with call light in reach and QRB donned.  Therapy Documentation Precautions:  Precautions Precautions: Fall Precaution Comments: monitor HR Restrictions Weight Bearing Restrictions: No  See Function Navigator for Current Functional Status.   Therapy/Group: Individual Therapy  Tonny Branch 06/29/2017, 8:58 AM

## 2017-06-29 NOTE — Progress Notes (Signed)
Subjective:  He is presently undergoing inpatient physical rehabilitation.  States that he has started to feel better and notices that his strength is coming back.  Denies any chest pain or shortness of breath. Objective:  Vital Signs in the last 24 hours: Pulse Rate:  [84] 84 (01/24 2127) BP: (112-121)/(54-55) 121/55 (01/25 0645)  Intake/Output from previous day: 01/24 0701 - 01/25 0700 In: 660 [P.O.:660] Out: 500 [Urine:500]  Physical Exam: Blood pressure 113/78, pulse 90, temperature 97.8 F (36.6 C), temperature source Oral, resp. rate 16, height '5\' 9"'$  (1.753 m), weight 76.4 kg (168 lb 6.9 oz), SpO2 98 %.  General appearance: alert, cooperative, appears stated age and no distress Eyes: negative findings: lids and lashes normal Neck: Ecchymosis involving his neck and face has improved significantly.  No JVD. Neck: JVP - normal, carotids 2+= without bruits Resp: bilateral expiratory wheezing present.  Emphysematous, distant breath sounds. Chest wall: no tenderness Cardio: S1 variable, S2 normal, distant heart sounds, no murmur appreciated. Tachycardic.  GI: soft, non-tender; bowel sounds normal; no masses,  no organomegaly Extremities: extremities normal, atraumatic, no cyanosis or edema    Lab Results: BMP Recent Labs    06/25/17 0342 06/26/17 0610 06/28/17 0557  NA 135 133* 136  K 4.5 3.9 3.7  CL 97* 95* 101  CO2 '29 25 25  '$ GLUCOSE 126* 136* 115*  BUN '17 18 15  '$ CREATININE 0.96 0.95 0.88  CALCIUM 8.5* 8.5* 8.2*  GFRNONAA >60 >60 >60  GFRAA >60 >60 >60    CBC Recent Labs  Lab 06/26/17 0610  06/29/17 0719  WBC 9.5   < > 8.3  RBC 4.01*   < > 3.61*  HGB 12.4*   < > 11.2*  HCT 38.8*   < > 34.8*  PLT 187   < > 190  MCV 96.8   < > 96.4  MCH 30.9   < > 31.0  MCHC 32.0   < > 32.2  RDW 14.2   < > 14.7  LYMPHSABS 0.7  --   --   MONOABS 1.3*  --   --   EOSABS 0.1  --   --   BASOSABS 0.0  --   --    < > = values in this interval not displayed.    Hepatic  Function Panel Recent Labs    06/24/17 0623 06/25/17 0342 06/26/17 0610  PROT 5.5* 5.4* 5.4*  ALBUMIN 2.3* 2.2* 2.1*  AST '24 23 31  '$ ALT '20 20 25  '$ ALKPHOS 70 69 80  BILITOT 1.7* 1.6* 1.3*   Cardiac Studies:  EKG 06/20/2017: Atrial fibrillation with rapid ventricle response at the rate of 124 bpm, normal axis. Poor R progression, probably normal variant, cannot exclude anteroseptal infarct old. Nonspecific T abnormality.  Scheduled Meds: . amiodarone  200 mg Oral TID  . atorvastatin  10 mg Oral Q2000  . digoxin  0.125 mg Oral Daily  . diltiazem  60 mg Oral Q8H  . dutasteride  0.5 mg Oral Daily   And  . tamsulosin  0.4 mg Oral Daily  . metoprolol tartrate  50 mg Oral BID  . multivitamin with minerals  1 tablet Oral Daily  . polyethylene glycol  17 g Oral Daily  . QUEtiapine  25 mg Oral Q2000  . Rivaroxaban  15 mg Oral Q supper   Continuous Infusions: PRN Meds:.acetaminophen, alum & mag hydroxide-simeth, bisacodyl, diphenhydrAMINE, docusate sodium, guaiFENesin-dextromethorphan, lidocaine, ondansetron **OR** ondansetron (ZOFRAN) IV, polyethylene glycol, QUEtiapine, sodium phosphate, sorbitol, traMADol  Assessment/Plan:  1.  Chronic Afib, presently on PO amiodarone and also beta blocker, Digoxin and CCB. Heart rate Now well controlled. CHA2DS2VAsc score 4. Stroke risk 4% 2.CAD s/p LAD PCI 04/2017: stenting of the proximal LAD 04/24/2017: CSI atherectomy followed by DES stenting with 3.0 x 26 mm resolute Onyx followed by postdilatation with a 3.5 x 15 mm noncompliant balloon at 16 atm of pressure. 4. Abdominal aortic aneurysm: CT of the abdomen 11/29/2016: Heterogeneous plaque, fusiform abdominal aortic aneurysm distal to the origin of IMA, 4.8 cm, enlarged compared to 4.4 cm from 04/26/2015. Circumferential adherent mural thrombus. 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 Mild stenosis with calcification left renal artery. High-grade stenosis at  the origin of the right IIA, right external iliac artery high-grade stenosis. Severe calcification of the femoral arteries. Coronary calcification. Abdominal aortic duplex 08/23/2016: Moderate dilatation of the abdominal aorta is noted in the mid and distal aorta measuring 4.11 x 4.18 x 4.44 cm is seen. Diffuse plaque noted in the proximal aorta. Focal plaque noted in the mid and distal aorta. Compared to the study done on 02/15/2016, no significant change.  5. Hypertension  6. Tobacco abuse COPD patient still smokes 1 or 2 cigarettes at home. 7. Severe malnutrition 8. Failure to thrive in an adult   Recommendation:  Although he is on for negative chronotropic agents, heart rate is well maintained.I will decrease the dose of amiodarone to 200 mg daily.  Upon discharge he will go home on the present medications and I would like to see him back within one week to 10 days after discharge.  No clinical evidence of congestive heart failure.  Needs continued physical therapy.  With regard to CAD, he has not had any angina. I will continue Xarelto for A. Fib   Hemoglobin has remained stable.   Adrian Prows, M.D. 06/29/2017, 3:36 PM [facility], [place] Pager: [phone] Office: [phone] If no answer cell: [phone]
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
## 2017-06-29 NOTE — Progress Notes (Signed)
Speech Language Pathology Daily Session Notes  Patient Details  Name: Jeremy Macdonald MRN: [id number] Date of Birth: [birth year]  Today's Date: 06/29/2017  Session 1: SLP Individual Time: 0092-3300 SLP Individual Time Calculation (min): 45 min   Session 2: SLP Individual Time: 7622-6333 SLP Individual Time Calculation (min): 25  min  Short Term Goals: Week 1: SLP Short Term Goal 1 (Week 1): Patient will demonstrate functional problem solving for basic and familiar tasks with Mod A verbal cues.  SLP Short Term Goal 2 (Week 1): Patient will demonstrate sustained attention to functional tasks for ~10 minutes with Mod A verbal cues for redirection.  SLP Short Term Goal 3 (Week 1): Patient will demonstrate orientation to time and place with Mod A multimodal cues.  SLP Short Term Goal 4 (Week 1): Patient will identify 2 cognitive and 2 physical changes since admission with Max A multimodal cues.   Skilled Therapeutic Interventions:  Session 1: Skilled treatment session focused on cognitive goals. Today, patient appeared more alert and less confused than previous session. Patient participated in a basic money management task and required Max A verbal and visual cues for recall and functional problem solving during a basic money management task. Patient also required sustained attention to task for ~30 minutes with Mod A verbal cues for redirection. Patient left upright in wheelchair with all needs within reach and quick release belt in place. Continue with current plan of care.   Session 2:  Skilled treatment session focused on cognitive goals. Upon arrival, patient was awake while supine in bed and declined to get OOB but was agreeable to participate in treatment session.  Lunch arrived early, and patient required Mod A verbal cues for problem solving with tray set-up. Patient perseverative on "schools" and required Max A verbal cues for orientation. Patient alternating attention between  self-feeding and functional conversation with Min A verbal cues. Patient left semi-reclined in bed with alarm on and all needs within reach. Continue with current plan of care.    Function:   Cognition Comprehension Comprehension assist level: Understands basic 50 - 74% of the time/ requires cueing 25 - 49% of the time  Expression   Expression assist level: Expresses basic 50 - 74% of the time/requires cueing 25 - 49% of the time. Needs to repeat parts of sentences.  Social Interaction Social Interaction assist level: Interacts appropriately 75 - 89% of the time - Needs redirection for appropriate language or to initiate interaction.  Problem Solving Problem solving assist level: Solves basic 25 - 49% of the time - needs direction more than half the time to initiate, plan or complete simple activities  Memory Memory assist level: Recognizes or recalls 25 - 49% of the time/requires cueing 50 - 75% of the time    Pain Pain Assessment Pain Assessment: No/denies pain  Therapy/Group: Individual Therapy  Duha Abair 06/29/2017, 10:37 AM

## 2017-06-29 NOTE — Progress Notes (Signed)
Occupational Therapy Session Note  Patient Details  Name: Jeremy Macdonald MRN: [id number] Date of Birth: [birth year]  Today's Date: 06/29/2017 OT Individual Time: 1400-1530 OT Individual Time Calculation (min): 90 min   Short Term Goals: Week 1:  OT Short Term Goal 1 (Week 1): Pt will transfer with BSC/toilet with min A with LRAD OT Short Term Goal 2 (Week 1): Pt will bathe 10/10 parts with min A sit to stand OT Short Term Goal 3 (Week 1): Pt will be oriented x4 with min environmental cues OT Short Term Goal 4 (Week 1): Pt will dress UB with setup   Skilled Therapeutic Interventions/Progress Updates:    Pt greeted supine in bed, reporting pain but motivated to exercise (requesting to complete in bed). Worked on strengthening of UEs, endurance, and trunk in order to improve functional performance with self care tasks. Pt completing multiple sets of modified crunches and bridges (x10 reps) with extra time. He required verbal and tactile cues for technique, also manual cues for proper placement. Assist required for bending Lt knee due to pain. Pt engaging in UE stretches/strengthening exercises afterwards with active assist for L UE in gravity maximized positions. He reported that pain was still present, but that he felt "a whole lot better" during gentle exercises and stretches. Pt with tangential tendencies, often deviated from topic of conversation. Question if observation was due to [place] [facility] [place] vs. cognition. Reoriented him several instances to time, however he was oriented to place (intermittently) and situation (consistently). Pt and spouse inquiring about OT POC and ELOS. Provided them this education, with spouse verbalizing that she cannot provide more than supervision assist at time of d/c. Spouse inquiring about SNF options and OT relayed this information to SW. At end of tx pt was repositioned in bed for comfort, required 2 helpers to boost up in bed. He was left with spouse and bed alarm  activated.     Therapy Documentation Precautions:  Precautions Precautions: Fall Precaution Comments: monitor HR Restrictions Weight Bearing Restrictions: No General: General PT Missed Treatment Reason: Patient fatigue;Pain Pain: Pain Assessment Pain Assessment: 0-10 Pain Score: 9  Pain Type: Acute pain Pain Location: Shoulder Pain Orientation: Left Pain Radiating Towards: Neck Pain Intervention(s): Medication (See eMAR) ADL: ADL ADL Comments: see functional navigator    See Function Navigator for Current Functional Status.   Therapy/Group: Individual Therapy  Emmanuell Kantz A Kailee Essman 06/29/2017, 3:40 PM

## 2017-06-30 ENCOUNTER — Inpatient Hospital Stay (HOSPITAL_COMMUNITY): Payer: Medicare Other | Admitting: Physical Therapy

## 2017-06-30 ENCOUNTER — Inpatient Hospital Stay (HOSPITAL_COMMUNITY): Payer: Medicare Other | Admitting: Occupational Therapy

## 2017-06-30 ENCOUNTER — Inpatient Hospital Stay (HOSPITAL_COMMUNITY): Payer: Medicare Other

## 2017-06-30 DIAGNOSIS — I481 Persistent atrial fibrillation: Secondary | ICD-10-CM

## 2017-06-30 LAB — URINE CULTURE: CULTURE: NO GROWTH

## 2017-06-30 LAB — CBC
HCT: 34.2 % — ABNORMAL LOW (ref 39.0–52.0)
Hemoglobin: 10.5 g/dL — ABNORMAL LOW (ref 13.0–17.0)
MCH: 29.7 pg (ref 26.0–34.0)
MCHC: 30.7 g/dL (ref 30.0–36.0)
MCV: 96.6 fL (ref 78.0–100.0)
PLATELETS: 201 10*3/uL (ref 150–400)
RBC: 3.54 MIL/uL — AB (ref 4.22–5.81)
RDW: 14.5 % (ref 11.5–15.5)
WBC: 9.6 10*3/uL (ref 4.0–10.5)

## 2017-06-30 MED ORDER — AMIODARONE HCL 200 MG PO TABS
200.0000 mg | ORAL_TABLET | Freq: Every day | ORAL | Status: DC
  Administered 2017-07-01 – 2017-07-10 (×10): 200 mg via ORAL
  Filled 2017-06-30 (×10): qty 1

## 2017-06-30 NOTE — Progress Notes (Signed)
Occupational Therapy Session Note  Patient Details  Name: Jeremy Macdonald MRN: [id number] Date of Birth: Aug 29, 1925  Today's Date: 06/30/2017 OT Individual Time: 3559-7416 and 1450-1533 OT Individual Time Calculation (min): 33 min and 43 min 27 minutes missed OT time  Short Term Goals: Week 1:  OT Short Term Goal 1 (Week 1): Pt will transfer with BSC/toilet with min A with LRAD OT Short Term Goal 2 (Week 1): Pt will bathe 10/10 parts with min A sit to stand OT Short Term Goal 3 (Week 1): Pt will be oriented x4 with min environmental cues OT Short Term Goal 4 (Week 1): Pt will dress UB with setup   Skilled Therapeutic Interventions/Progress Updates:    Pt greeted supine in bed, reporting that he was about to pull up his car and drive home. "I just walked over here and found this bed to lie down in this morning and now I want to go." Reoriented pt to place, situation, and OT goals (that we had discussed yesterday with spouse). Pt A&O to self this AM, unable to recall any therapeutic events yesterday. Provided pt opportunities to complete self care, bed exercises, or EOB therex with pt declining and reporting that he just wanted to "go home."  Adamantly declining tx. 2 helpers for boosting pt up in bed. Repositioned him for comfort, with L UE elevated with ice pack. Left him with call bell in lap and bed alarm activated.  27 minutes missed due to refusal to participate   2nd Session 1:1 tx (43 min) Pt greeted via PT handoff near RN station. He self propelled to dayroom and worked on sit<stands at elevated table. Pt able to power up with Mod-Max A and manual facilitation for anterior weight shifting. Pt able to stand for 1 minute windows before fatiguing and needing to sit. Had him practice using Stedy in gym with demonstration and tactile cues to maximize safety. He completed sit<stands with Min-Mod A and able to tolerate upright sitting in [place] without c/o pain. Retrieved BSC for his room,  and practiced using Stedy for simulated [facility] transfer for safer transfer options with RN staff at this time. Updated safety plan to reflect this. At end of tx pt was left with family. Family agreed to return pt to RN station prior to their departure.     Therapy Documentation Precautions:  Precautions Precautions: Fall Precaution Comments: monitor HR Restrictions Weight Bearing Restrictions: No General: General PT Missed Treatment Reason: Pain  Pain: Reports pain in L UE/LE. RN notified and provided medication during 1st session. Also utilized cryotherapy for pain relief Pain Assessment Pain Assessment: PAINAD Pain Score: 6  Faces Pain Scale: Hurts even more Pain Type: Acute pain Pain Location: Arm Pain Orientation: Left Pain Descriptors / Indicators: Aching Pain Onset: On-going Pain Intervention(s): Medication (See eMAR) ADL: ADL ADL Comments: see functional navigator     See Function Navigator for Current Functional Status.  Therapy/Group: Individual Therapy  Moustapha Tooker A Samyrah Bruster 06/30/2017, 12:35 PM

## 2017-06-30 NOTE — Progress Notes (Signed)
Jeremy Macdonald is a 84 y.o. male May 27, 1926 [id number]  Subjective: Feels "terrible" because "I have no strength to stand" - Wants to know if car has been located as he wants to go home this afternoon (but understands why he is getting therapy for weakness). Denies specific pain  Objective: Vital signs in last 24 hours: Temp:  [97.5 F (36.4 C)-98.4 F (36.9 C)] 97.5 F (36.4 C) (01/26 0428) Pulse Rate:  [85-100] 100 (01/26 0428) Resp:  [17-18] 18 (01/26 0428) BP: (118-138)/(66-83) 118/67 (01/26 0505) SpO2:  [93 %-97 %] 97 % (01/26 0428) Weight:  [75.2 kg (165 lb 12.6 oz)] 75.2 kg (165 lb 12.6 oz) (01/26 0505) Weight change: 0 kg (0 lb) Last BM Date: 06/27/17  Intake/Output from previous day: 01/25 0701 - 01/26 0700 In: 780 [P.O.:780] Out: 1075 [Urine:1075]  Physical Exam General: No apparent distress   Verbally interactive and freq appropriate though occ nonsensical content Lungs: Normal effort. Lungs clear to auscultation, no crackles or wheezes. Cardiovascular: Regular rate and rhythm, no edema Wounds:   Clean, dry, intact. No signs of infection.  Lab Results: BMET    Component Value Date/Time   NA 136 06/28/2017 0557   K 3.7 06/28/2017 0557   CL 101 06/28/2017 0557   CO2 25 06/28/2017 0557   GLUCOSE 115 (H) 06/28/2017 0557   BUN 15 06/28/2017 0557   CREATININE 0.88 06/28/2017 0557   CALCIUM 8.2 (L) 06/28/2017 0557   GFRNONAA >60 06/28/2017 0557   GFRAA >60 06/28/2017 0557   CBC    Component Value Date/Time   WBC 9.6 06/30/2017 0539   RBC 3.54 (L) 06/30/2017 0539   HGB 10.5 (L) 06/30/2017 0539   HCT 34.2 (L) 06/30/2017 0539   PLT 201 06/30/2017 0539   MCV 96.6 06/30/2017 0539   MCH 29.7 06/30/2017 0539   MCHC 30.7 06/30/2017 0539   RDW 14.5 06/30/2017 0539   LYMPHSABS 0.7 06/26/2017 0610   MONOABS 1.3 (H) 06/26/2017 0610   EOSABS 0.1 06/26/2017 0610   BASOSABS 0.0 06/26/2017 0610   CBG's (last 3):  No results for input(s): GLUCAP in the last 72  hours. LFT's Lab Results  Component Value Date   ALT 25 06/26/2017   AST 31 06/26/2017   ALKPHOS 80 06/26/2017   BILITOT 1.3 (H) 06/26/2017    Studies/Results: No results found.  Medications:  I have reviewed the patient's current medications. Scheduled Medications: . [START ON 07/01/2017] amiodarone  200 mg Oral Daily  . atorvastatin  10 mg Oral Q2000  . digoxin  0.125 mg Oral Daily  . diltiazem  60 mg Oral Q8H  . dutasteride  0.5 mg Oral Daily   And  . tamsulosin  0.4 mg Oral Daily  . metoprolol tartrate  50 mg Oral BID  . multivitamin with minerals  1 tablet Oral Daily  . polyethylene glycol  17 g Oral Daily  . QUEtiapine  25 mg Oral Q2000  . Rivaroxaban  15 mg Oral Q supper   PRN Medications: acetaminophen, alum & mag hydroxide-simeth, bisacodyl, diphenhydrAMINE, docusate sodium, guaiFENesin-dextromethorphan, lidocaine, ondansetron **OR** ondansetron (ZOFRAN) IV, polyethylene glycol, QUEtiapine, sodium phosphate, sorbitol, traMADol  Assessment/Plan: Active Problems:   Debility   1. TBI with debility/FTT - continue ongoing inpatient rehab therapies, offered reassurance on participation efforts despite fatigue symptoms  2. A fib with RVR hx - on amio + beta-blocker - begun Dig per cards on 1/25 with tirating Dilt - on Xarelto anti coag 3. Urinary retention - foley out,  monitoring PVR volumes   Length of stay, days: 5   Mande Auvil A. Asa Lente, MD 06/30/2017, 12:47 PM

## 2017-06-30 NOTE — Progress Notes (Addendum)
Physical Therapy Session Note  Patient Details  Name: Jeremy Macdonald MRN: [id number] Date of Birth: [birth year]  Today's Date: 06/30/2017 PT Individual Time: 0900-0940 AND 1425-1445 PT Individual Time Calculation (min): 40 min AND 20 min (make up time from this morning)   Short Term Goals: Week 1:  PT Short Term Goal 1 (Week 1): Pt will negotiate 4 steps with B rails and mod assist. PT Short Term Goal 2 (Week 1): Pt will complete sit<>stand transfers with min assist. PT Short Term Goal 3 (Week 1): Pt will consistently complete stand pivot transfers with min assist. PT Short Term Goal 4 (Week 1): Pt will ambulate 75 ft with LRAD & min assist.  Skilled Therapeutic Interventions/Progress Updates:   Session 1:  Pt supine and agreeable to therapy, no c/o pain in supine. Transferred to EOB w/ mod assist and made 4-5 attempts to stand w/ RW and max assist from therapist. Pt unable to reach full erect posture and very shaky preventing safe transition to full upright. Increased height of bed for attempts at standing, but remained unsuccessful. Pt now c/o 10/10 pain in legs and increased dizziness w/ last few attempts at powering up to stance. Vital signs 128/75, 119 bpm, and 96% O2. Dizziness resolves w/ brief rest at EOB. Moderate increase in work of breathing w/ attempts that resolves w/ seated rest. Overall, pt maintained static sitting balance at EOB for 30 minutes while making attempts to stand/transfer. Attempted lateral scoot transfer to w/c, however unable 2/2 cognition. Returned to supine w/ max assist. Attempted scooting up in bed using bridge technique, pt unable to tolerate 2/2 pain. Total assist scooting up in bed. Made RN aware of pain and dizziness episodes.Ended session in supine, call bell within reach and all needs met. Missed 20 min of skilled PT 2/2 pain.   Session 2:  Pt in supine, no c/o pain, and agreeable to make another attempt at getting OOB. Transferred to EOB w/ min assist  and attempted squat pivot transfer to w/c. Unable to safely perform 2/2 cognition, transferred via lateral scoot w/ max assist instead. Focused on tolerance to upright activity and endurance while self-propelling w/c in 100-150' bouts around unit. Min cues for technique at turns. Moderate increase in work of breathing resolved w/ brief rest breaks. Ended session in w/c and in care of OT, all needs met.   Therapy Documentation Precautions:  Precautions Precautions: Fall Precaution Comments: monitor HR Restrictions Weight Bearing Restrictions: No Pain: Pain Assessment Pain Assessment: 0-10 Pain Score: 10-Worst pain ever Pain Type: Acute pain Pain Location: Knee Pain Orientation: Right;Left Pain Onset: With Activity  See Function Navigator for Current Functional Status.   Therapy/Group: Individual Therapy  Alexandar Weisenberger K Arnette 06/30/2017, 9:47 AM

## 2017-06-30 NOTE — Progress Notes (Signed)
Speech Language Pathology Daily Session Note  Patient Details  Name: Jeremy Macdonald MRN: [id number] Date of Birth: [birth year]  Today's Date: 06/30/2017 SLP Individual Time: 1000-1030 SLP Individual Time Calculation (min): 30 min  Short Term Goals: Week 1: SLP Short Term Goal 1 (Week 1): Patient will demonstrate functional problem solving for basic and familiar tasks with Mod A verbal cues.  SLP Short Term Goal 2 (Week 1): Patient will demonstrate sustained attention to functional tasks for ~10 minutes with Mod A verbal cues for redirection.  SLP Short Term Goal 3 (Week 1): Patient will demonstrate orientation to time and place with Mod A multimodal cues.  SLP Short Term Goal 4 (Week 1): Patient will identify 2 cognitive and 2 physical changes since admission with Max A multimodal cues.   Skilled Therapeutic Interventions: Skilled St services focused on cognitive skills. SLP provided Max A verbal/visual cues for orientation utilizing visual aids in room and memory notebook. SLP facilitated basic problem solving skills for novel task, sustained attention and recall utilizing visual aid during Blink in simplest form, pt required Max A verbal cues for basic problem solving and max A verbal/visual cues for recall to use visual aid for rules and Mod A verbal redirection for sustained attention. Pt was left in room with call  Bell within reach. Recommend to continue skilled ST services.     Function:  Eating Eating                 Cognition Comprehension Comprehension assist level: Understands basic 50 - 74% of the time/ requires cueing 25 - 49% of the time  Expression   Expression assist level: Expresses basic 50 - 74% of the time/requires cueing 25 - 49% of the time. Needs to repeat parts of sentences.  Social Interaction Social Interaction assist level: Interacts appropriately 75 - 89% of the time - Needs redirection for appropriate language or to initiate interaction.  Problem  Solving Problem solving assist level: Solves basic 25 - 49% of the time - needs direction more than half the time to initiate, plan or complete simple activities  Memory Memory assist level: Recognizes or recalls 25 - 49% of the time/requires cueing 50 - 75% of the time    Pain Pain Assessment Pain Assessment: No/denies pain  Therapy/Group: Individual Therapy  Decorian Schuenemann  [facility] 06/30/2017, 5:43 PM

## 2017-06-30 NOTE — Plan of Care (Signed)
  RH BLADDER ELIMINATION RH STG MANAGE BLADDER WITH ASSISTANCE Description STG Manage Bladder With total Assistance bladder scan q 6 hours and cath if greater than 350 06/30/2017 1639 - Not Progressing by Ander Slade, RN   RH SKIN INTEGRITY RH STG SKIN FREE OF INFECTION/BREAKDOWN Description Skin to remain free from infection and breakdown while continuing to heal from bruising associated with recent fall with min assist from staff.  Pt had fragile skin; ST rt lower ext 06/30/2017 1639 - Not Progressing by Ander Slade, RN   RH SAFETY RH STG ADHERE TO SAFETY PRECAUTIONS W/ASSISTANCE/DEVICE Description STG Adhere to Safety Precautions With moderate Assistance and appropriate assistive Device. Patient very impulsive and confused 06/30/2017 1639 - Not Progressing by Ander Slade, RN

## 2017-07-01 ENCOUNTER — Inpatient Hospital Stay (HOSPITAL_COMMUNITY): Payer: Medicare Other | Admitting: Occupational Therapy

## 2017-07-01 DIAGNOSIS — I482 Chronic atrial fibrillation: Secondary | ICD-10-CM

## 2017-07-01 NOTE — Progress Notes (Signed)
Jeremy Macdonald is a 84 y.o. male Oct 14, 1925 [id number]  Subjective: No new complaints.  Initially sleeping at time of my visit, but awoken easily.  Reports continued fatigue but no specific problem.  Objective: Vital signs in last 24 hours: Temp:  [97.5 F (36.4 C)] 97.5 F (36.4 C) (01/27 0341) Pulse Rate:  [74-111] 80 (01/27 0341) Resp:  [18-19] 19 (01/27 0341) BP: (91-125)/(51-80) 125/78 (01/27 0341) SpO2:  [97 %-98 %] 97 % (01/27 0341) Weight:  [75.4 kg (166 lb 3.6 oz)] 75.4 kg (166 lb 3.6 oz) (01/27 0341) Weight change: 0.2 kg (7.1 oz) Last BM Date: 06/30/17  Intake/Output from previous day: 01/26 0701 - 01/27 0700 In: 30 [P.O.:30] Out: 200 [Urine:200]  Physical Exam General: No apparent distress    Lungs: Normal effort. Lungs clear to auscultation, no crackles or wheezes. Cardiovascular: Regular rate and rhythm, no edema Neurological: No new neurological deficits Wounds: Clean, dry, intact. No signs of infection.  Lab Results: BMET    Component Value Date/Time   NA 136 06/28/2017 0557   K 3.7 06/28/2017 0557   CL 101 06/28/2017 0557   CO2 25 06/28/2017 0557   GLUCOSE 115 (H) 06/28/2017 0557   BUN 15 06/28/2017 0557   CREATININE 0.88 06/28/2017 0557   CALCIUM 8.2 (L) 06/28/2017 0557   GFRNONAA >60 06/28/2017 0557   GFRAA >60 06/28/2017 0557   CBC    Component Value Date/Time   WBC 9.6 06/30/2017 0539   RBC 3.54 (L) 06/30/2017 0539   HGB 10.5 (L) 06/30/2017 0539   HCT 34.2 (L) 06/30/2017 0539   PLT 201 06/30/2017 0539   MCV 96.6 06/30/2017 0539   MCH 29.7 06/30/2017 0539   MCHC 30.7 06/30/2017 0539   RDW 14.5 06/30/2017 0539   LYMPHSABS 0.7 06/26/2017 0610   MONOABS 1.3 (H) 06/26/2017 0610   EOSABS 0.1 06/26/2017 0610   BASOSABS 0.0 06/26/2017 0610   CBG's (last 3):  No results for input(s): GLUCAP in the last 72 hours. LFT's Lab Results  Component Value Date   ALT 25 06/26/2017   AST 31 06/26/2017   ALKPHOS 80 06/26/2017   BILITOT 1.3  (H) 06/26/2017    Studies/Results: No results found.  Medications:  I have reviewed the patient's current medications. Scheduled Medications: . amiodarone  200 mg Oral Daily  . atorvastatin  10 mg Oral Q2000  . digoxin  0.125 mg Oral Daily  . diltiazem  60 mg Oral Q8H  . dutasteride  0.5 mg Oral Daily   And  . tamsulosin  0.4 mg Oral Daily  . metoprolol tartrate  50 mg Oral BID  . multivitamin with minerals  1 tablet Oral Daily  . polyethylene glycol  17 g Oral Daily  . QUEtiapine  25 mg Oral Q2000  . Rivaroxaban  15 mg Oral Q supper   PRN Medications: acetaminophen, alum & mag hydroxide-simeth, bisacodyl, diphenhydrAMINE, docusate sodium, guaiFENesin-dextromethorphan, lidocaine, ondansetron **OR** ondansetron (ZOFRAN) IV, polyethylene glycol, QUEtiapine, sodium phosphate, sorbitol, traMADol  Assessment/Plan: Active Problems:   Debility   1.  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  Continue ongoing CIR therapy. 2.  A. fib with history of rapid ventricular response.  On amiodarone plus beta-blocker and digoxin.  Cardiology titrating diltiazem.  Continue full anticoagulation with Xarelto. 3.  History of urinary retention.  Continue monitoring post void residual volumes with Foley discontinued.  Length of stay, days: 6  Valerie A. Asa Lente, MD 07/01/2017, 8:23 AM

## 2017-07-01 NOTE — Progress Notes (Addendum)
Occupational Therapy Session Note  Patient Details  Name: Jeremy Macdonald MRN: [id number] Date of Birth: January 08, 1926  Today's Date: 07/01/2017 OT Individual Time: 2694-8546 OT Individual Time Calculation (min): 76 min    Short Term Goals: Week 1:  OT Short Term Goal 1 (Week 1): Pt will transfer with BSC/toilet with min A with LRAD OT Short Term Goal 2 (Week 1): Pt will bathe 10/10 parts with min A sit to stand OT Short Term Goal 3 (Week 1): Pt will be oriented x4 with min environmental cues OT Short Term Goal 4 (Week 1): Pt will dress UB with setup   Skilled Therapeutic Interventions/Progress Updates:    Pt greeted in w/c with spouse present. Per RN staff, pt was having a much better day transfer-wise and presented with improved mental clarity than yesterday. Pt completed stand pivot<TTB with Min A and cues for hand placement(!). Pt bathing at sit<stand level with Min A for power up with grab bars. Min visual cues for sequencing due to [facility]. He required assist for perihygiene due to residue from BM. Pt then transferred back to w/c and proceeded to dress at sink. Pt handed shirt, then looked to spouse and asked "where's my shirt?" He looked genuinely surprised by shirt being in lap. Pt requiring cues for attention, basic problem solving, self organization, and general awareness. Mod A for sit<stand during LB dressing due to fatigue at this point, however he was able to complete 4/4 components of donning pants himself (including fastening buttons with extra time!). He then self propelled to dayroom for UB strengthening purposes and engaged in orientation conversation with OT, then self propelled back to room with Min A for w/c navigation around tight spaces. Able to pathfind his way back with min vcs.  At end of tx pt was left with spouse, agreeable to remain up in w/c until after dinnertime.   Pt oriented to self, general situation, and month/year today. Reoriented him to place as he believed he  was currently in a hotel.   Therapy Documentation Precautions:  Precautions Precautions: Fall Precaution Comments: monitor HR Restrictions Weight Bearing Restrictions: No Pain: No s/s pain during tx    ADL: ADL ADL Comments: see functional navigator     See Function Navigator for Current Functional Status.   Therapy/Group: Individual Therapy  Brewer Hitchman A Siddhanth Denk 07/01/2017, 3:37 PM

## 2017-07-02 ENCOUNTER — Inpatient Hospital Stay (HOSPITAL_COMMUNITY): Payer: Medicare Other | Admitting: Speech Pathology

## 2017-07-02 ENCOUNTER — Inpatient Hospital Stay (HOSPITAL_COMMUNITY): Payer: Medicare Other | Admitting: Occupational Therapy

## 2017-07-02 ENCOUNTER — Inpatient Hospital Stay (HOSPITAL_COMMUNITY): Payer: Medicare Other

## 2017-07-02 ENCOUNTER — Inpatient Hospital Stay (HOSPITAL_COMMUNITY): Payer: Medicare Other | Admitting: Physical Therapy

## 2017-07-02 NOTE — Progress Notes (Signed)
Physical Therapy Session Note  Patient Details  Name: Jeremy Macdonald MRN: [id number] Date of Birth: 1926/05/27  Today's Date: 07/02/2017 PT Individual Time: 1100-1156 PT Individual Time Calculation (min): 56 min   Short Term Goals: Week 1:  PT Short Term Goal 1 (Week 1): Pt will negotiate 4 steps with B rails and mod assist. PT Short Term Goal 2 (Week 1): Pt will complete sit<>stand transfers with min assist. PT Short Term Goal 3 (Week 1): Pt will consistently complete stand pivot transfers with min assist. PT Short Term Goal 4 (Week 1): Pt will ambulate 75 ft with LRAD & min assist.  Skilled Therapeutic Interventions/Progress Updates:    Pt seated in w/c in room upon PT arrival, agreeable to therapy tx and denies pain. Pt transported from room>gym x 150 ft total assist. Pt ambulated 2 x 100 ft with RW and min assist working on activity tolerance and endurance. Pt oriented x 2 (self and place) this session, requiring cues to reorient. Pt performed sit<>stands x 6 throughout session with min assist and verbal cues for one hand on w/c armrest and one on RW. Pt worked on Furniture conservator/restorer during ambulation to ambulate around cones with RW, min assist.  Pt worked on standing balance and cognition in order to complete peg board puzzle, cues for initial placement of first 2 pieces needed. Pt performed car transfer x 1 without RW, stand pivot mod assist and x 1 with RW min assist. Pt performed transfer from w/c<>recliner in rehab apartment using RW, mod assist to stand from recliner height. Pt transported back to room, discussed session with pt's wife. Pt unable to recall activities performed during this session, therapist filled out memory book. Pt left seated in w/c with needs in reach and wife present.    Therapy Documentation Precautions:  Precautions Precautions: Fall Precaution Comments: monitor HR Restrictions Weight Bearing Restrictions: No   See Function Navigator for Current  Functional Status.   Therapy/Group: Individual Therapy  Netta Corrigan, PT, DPT 07/02/2017, 8:00 AM

## 2017-07-02 NOTE — Progress Notes (Addendum)
Physical Therapy Session Note  Patient Details  Name: Jeremy Macdonald MRN: [id number] Date of Birth: 08/31/1925  Today's Date: 07/02/2017 PT Individual Time: 1533-1601 PT Individual Time Calculation (min): 28 min   Short Term Goals: Week 2:  PT Short Term Goal 1 (Week 2): Pt will initiate stair negotiation for strengthening purposes. PT Short Term Goal 2 (Week 2): Pt will consistently complete sit<>stand transfers with min assist.  Skilled Therapeutic Interventions/Progress Updates:  Pt received asleep in bed with wife present. Pt awakened & agreeable to tx, denying c/o pain. Pt transferred supine<>sitting EOB with supervision, HOB elevated & bed rails. Pt completes sit>stand transfers with min assist with cuing for proper hand placement to push up on bed or nu-step armrest. Pt ambulates room<>dayroom with RW & steady assist with cuing to push RW instead of picking it up. Pt demonstrates impaired safety awareness as he will push RW too far out in front of him. Pt utilized nu-step on level 1 x 11 minutes with all four extremities with task focusing on endurance & strengthening. Pt requires max assist to calculate remaining time for activity. Pt noted fatigue after task. At end of session pt left in bed with alarm set, all needs within reach, & wife present.  Addendum: HR = 78 bpm, SpO2 = 94% on room air at end of session.   Therapy Documentation Precautions:  Precautions Precautions: Fall Precaution Comments: monitor HR Restrictions Weight Bearing Restrictions: No   See Function Navigator for Current Functional Status.   Therapy/Group: Individual Therapy  [name] 07/02/2017, 4:07 PM

## 2017-07-02 NOTE — Progress Notes (Addendum)
Physical Therapy Weekly Progress Note  Patient Details  Name: Jeremy Macdonald MRN: [id number] Date of Birth: 1926/05/23  Beginning of progress report period: June 26, 2017 End of progress report period: July 02, 2017  Today's Date: 07/02/2017  Patient has met 1 of 4 short term goals.  Pt is making slow progress towards all LTG's as pt is limited by fatigue & impaired cognition. Pt currently requires max<>min assist for sit>stand transfers and this is dependent on height of surface. Pt has been able to ambulate up to 100 ft with min assist and RW. Pt would benefit from continued skilled PT treatment to focus on endurance, strengthening, safety with all mobility, cognitive remediation, balance, gait, and pt/family education prior to d/c.  Patient continues to demonstrate the following deficits muscle weakness, decreased cardiorespiratoy endurance, decreased awareness, decreased problem solving, decreased safety awareness, decreased memory and delayed processing, and decreased standing balance, decreased postural control and decreased balance strategies and therefore will continue to benefit from skilled PT intervention to increase functional independence with mobility.  Patient is making slow, inconsistent progress towards LTGs..  Plan of care revisions: goals downgraded to supervision<>min assist overall with LRAD.  PT Short Term Goals Week 1:  PT Short Term Goal 1 (Week 1): Pt will negotiate 4 steps with B rails and mod assist. PT Short Term Goal 1 - Progress (Week 1): Not met PT Short Term Goal 2 (Week 1): Pt will complete sit<>stand transfers with min assist. PT Short Term Goal 2 - Progress (Week 1): Progressing toward goal(inconsistent progress; requires max<>min assist & dependent upon height of surface) PT Short Term Goal 3 (Week 1): Pt will consistently complete stand pivot transfers with min assist. PT Short Term Goal 3 - Progress (Week 1): Not met PT Short Term Goal 4 (Week  1): Pt will ambulate 75 ft with LRAD & min assist. PT Short Term Goal 4 - Progress (Week 1): Met Week 2:  PT Short Term Goal 1 (Week 2): Pt will initiate stair training for strengthening purposes. PT Short Term Goal 2 (Week 2): Pt will consistently complete sit<>stand transfers with min assist.   Therapy Documentation Precautions:  Precautions Precautions: Fall Precaution Comments: monitor HR Restrictions Weight Bearing Restrictions: No   See Function Navigator for Current Functional Status.  Therapy/Group: Individual Therapy  [name] 07/02/2017, 2:39 PM

## 2017-07-02 NOTE — Progress Notes (Signed)
Speech Language Pathology Daily Session Note  Patient Details  Name: Jeremy Macdonald MRN: [id number] Date of Birth: 1925/12/27  Today's Date: 07/02/2017 SLP Individual Time: 1000-1100 SLP Individual Time Calculation (min): 60 min  Short Term Goals: Week 1: SLP Short Term Goal 1 (Week 1): Patient will demonstrate functional problem solving for basic and familiar tasks with Mod A verbal cues.  SLP Short Term Goal 2 (Week 1): Patient will demonstrate sustained attention to functional tasks for ~10 minutes with Mod A verbal cues for redirection.  SLP Short Term Goal 3 (Week 1): Patient will demonstrate orientation to time and place with Mod A multimodal cues.  SLP Short Term Goal 4 (Week 1): Patient will identify 2 cognitive and 2 physical changes since admission with Max A multimodal cues.   Skilled Therapeutic Interventions: Skilled treatment session focused on cognitive goals. SLP facilitated session by providing extra time and Min A verbal cues for use of external aid for orientation to date. Patient also recalled events from morning therapy session with Mod question cues. SLP also facilitated session by providing total A for recall of his current medications and their functions and for organization of a pill box. Will continue task at next session. Patient left upright in wheelchair with wife present. Continue with current plan of care.      Function:   Cognition Comprehension Comprehension assist level: Understands basic 50 - 74% of the time/ requires cueing 25 - 49% of the time  Expression   Expression assist level: Expresses basic 75 - 89% of the time/requires cueing 10 - 24% of the time. Needs helper to occlude trach/needs to repeat words.  Social Interaction Social Interaction assist level: Interacts appropriately 75 - 89% of the time - Needs redirection for appropriate language or to initiate interaction.  Problem Solving Problem solving assist level: Solves basic 25 - 49% of the  time - needs direction more than half the time to initiate, plan or complete simple activities  Memory Memory assist level: Recognizes or recalls 50 - 74% of the time/requires cueing 25 - 49% of the time    Pain No/Denies Pain   Therapy/Group: Individual Therapy  Mariko Nowakowski 07/02/2017, 11:06 AM

## 2017-07-02 NOTE — Plan of Care (Signed)
  Consults RH GENERAL PATIENT EDUCATION Description See Patient Education module for education specifics. 07/02/2017 1612 - Progressing by Dietrich Pates, RN   RH BLADDER ELIMINATION RH STG MANAGE BLADDER WITH ASSISTANCE Description STG Manage Bladder With total Assistance  07/02/2017 1612 - Progressing by Dietrich Pates, RN   RH SAFETY RH STG ADHERE TO SAFETY PRECAUTIONS W/ASSISTANCE/DEVICE Description STG Adhere to Safety Precautions With moderate Assistance and appropriate assistive Device.  07/02/2017 1612 - Progressing by Dietrich Pates, RN

## 2017-07-02 NOTE — Progress Notes (Signed)
[facility]     PROGRESS NOTE    Subjective/Complaints: Seem to have slept most nights over the weekend.  Just waking up when I entered this morning  ROS: pt denies nausea, vomiting, diarrhea, cough, shortness of breath or chest pain   Objective: Vital Signs: Blood pressure (!) 154/70, pulse 92, temperature 98 F (36.7 C), temperature source Oral, resp. rate 18, height 5\' 9"  (1.753 m), weight 75.3 kg (166 lb 0.1 oz), SpO2 94 %. No results found. Recent Labs    06/30/17 0539  WBC 9.6  HGB 10.5*  HCT 34.2*  PLT 201   No results for input(s): NA, K, CL, GLUCOSE, BUN, CREATININE, CALCIUM in the last 72 hours.  Invalid input(s): CO CBG (last 3)  No results for input(s): GLUCAP in the last 72 hours.  Wt Readings from Last 3 Encounters:  07/02/17 75.3 kg (166 lb 0.1 oz)  06/25/17 78.3 kg (172 lb 9.6 oz)  04/25/17 80.2 kg (176 lb 12.9 oz)    Physical Exam:  Constitutional: He is oriented to person, place, and time. He appears well-developed and well-nourished. No distress.    Facial wounds healing HENT:  Head: Normocephalic and atraumatic.  Mouth/Throat: Oropharynx is clear and moist.  Nasal bridge with abrasion   Eyes: EOM are normal. Pupils are equal, round, and reactive to light.  Neck: Normal range of motion. Neck supple.  Cardiovascular: RRR without murmur. No JVD     Respiratory.  CTA Bilaterally without wheezes or rales. Normal effort  GI: Soft. Bowel sounds are normal. He exhibits no distension. There is no tenderness.  Musculoskeletal: He exhibits edema.    Some pain and swelling near the right knee  persists. Neurological: Much more attentive.  Still not oriented to place or reason here.  Follows directions easily. HOH.   Motor: B/l UE 4/5 proximal to distal B/l LE: HF 2/5, KE 2+/5, ADF/PF 4+/5  -no change Psych: Pleasant     Assessment/Plan: 1.  Gait disorder and cognitive deficits secondary to secondary to recent fall  and likely traumatic brain injury as well as debility which require 3+ hours per day of interdisciplinary therapy in a comprehensive inpatient rehab setting. Physiatrist is providing close team supervision and 24 hour management of active medical problems listed below. Physiatrist and rehab team continue to assess barriers to discharge/monitor patient progress toward functional and medical goals.  Function:  Bathing Bathing position   Position: Shower  Bathing parts Body parts bathed by patient: Right arm, Left arm, Chest, Abdomen, Right upper leg, Left upper leg, Front perineal area, Right lower leg, Left lower leg Body parts bathed by helper: Buttocks, Back  Bathing assist Assist Level: Touching or steadying assistance(Pt > 75%)      Upper Body Dressing/Undressing Upper body dressing   What is the patient wearing?: Pull over shirt/dress     Pull over shirt/dress - Perfomed by patient: Thread/unthread left sleeve, Put head through opening, Pull shirt over trunk, Thread/unthread right sleeve Pull over shirt/dress - Perfomed by helper: Thread/unthread right sleeve Button up shirt - Perfomed by patient: Thread/unthread right sleeve Button up shirt - Perfomed by helper: Thread/unthread left sleeve, Pull shirt around back, Button/unbutton shirt    Upper body assist Assist Level: Supervision or verbal cues      Lower Body Dressing/Undressing Lower body dressing   What is the patient wearing?: Non-skid slipper socks, Pants   Underwear - Performed by helper: Thread/unthread right underwear leg, Thread/unthread left underwear leg,  Pull underwear up/down Pants- Performed by patient: Thread/unthread right pants leg, Thread/unthread left pants leg, Pull pants up/down, Fasten/unfasten pants Pants- Performed by helper: Pull pants up/down, Thread/unthread right pants leg, Thread/unthread left pants leg Non-skid slipper socks- Performed by patient: Don/doff right sock, Don/doff left sock Non-skid  slipper socks- Performed by helper: Don/doff right sock, Don/doff left sock Socks - Performed by patient: Don/doff right sock, Don/doff left sock   Shoes - Performed by patient: Don/doff right shoe, Don/doff left shoe, Fasten right, Fasten left            Lower body assist Assist for lower body dressing: Touching or steadying assistance (Pt > 75%)      Toileting Toileting   Toileting steps completed by patient: Performs perineal hygiene, Adjust clothing prior to toileting Toileting steps completed by helper: Adjust clothing after toileting Toileting Assistive Devices: Grab bar or rail  Toileting assist Assist level: Two helpers   Transfers Chair/bed transfer   Chair/bed transfer method: Stand pivot Chair/bed transfer assist level: Maximal assist (Pt 25 - 49%/lift and lower) Chair/bed transfer assistive device: Armrests, Bedrails     Locomotion Ambulation     Max distance: 100 ft Assist level: Touching or steadying assistance (Pt > 75%)   Wheelchair Wheelchair activity did not occur: Safety/medical concerns Type: Manual Max wheelchair distance: 150 ft Assist Level: Supervision or verbal cues  Cognition Comprehension Comprehension assist level: Understands basic 50 - 74% of the time/ requires cueing 25 - 49% of the time  Expression Expression assist level: Expresses basic 50 - 74% of the time/requires cueing 25 - 49% of the time. Needs to repeat parts of sentences.  Social Interaction Social Interaction assist level: Interacts appropriately 75 - 89% of the time - Needs redirection for appropriate language or to initiate interaction.  Problem Solving Problem solving assist level: Solves basic 25 - 49% of the time - needs direction more than half the time to initiate, plan or complete simple activities  Memory Memory assist level: Recognizes or recalls 25 - 49% of the time/requires cueing 50 - 75% of the time   Medical Problem List and Plan: 1.  Deficits in mobility and  self-care secondary to debility and TBI,.   -Continue PT, OT, ST  2.  DVT Prophylaxis/Anticoagulation: --on Xarelto.  3. Pain Management: continue ultram prn--- limit to avoid neuro sedating side effects 4. Mood: Ego support 5. Neuropsych: This patient is? Not  capable of making decisions on his own behalf.   -Adjusted Seroquel to 8 PM nightly for sleep and sundowning    -PRN Seroquel available as needed for severe agitation as well.       6. Skin/Wound Care: pressure relief measures.  R   -Remove sutures today from nose  7. Fluids/Electrolytes/Nutrition: Encourage p.o. intake.  I thoroughly reviewed patient's labs today.  Need to bruised nutrition and protein based on low albumin.   -Continue protein supplement      8. A fib with RVR: on amiodarone and metoprolol. Has resolved --started on lanoxin today and Cardizem titrated upwards to 60 mg tid. Monitor for orthostatic symptoms.  9.  Urinary retention:    -Continue Avodart and Flomax     -Foley removed.  Requiring intermittent catheterizations still.    -want patient on the toilet or bedside commode 10. Bilateral Knee OA: Right knee x-ray reviewed and shows mild arthritis as well as chondrocalcinosis.    11. Dizziness: Likely due to A flutter. Will check orthostatic vitals. 12. Abnormal LFTs: Likely  due to shocked liver and resolving hematoma.    -LFTs now essentially within normal limits  LOS (Days) 7 A FACE TO FACE EVALUATION WAS PERFORMED  Meredith Staggers, MD 07/02/2017 9:03 AM

## 2017-07-02 NOTE — Progress Notes (Signed)
Physical Therapy Session Note  Patient Details  Name: Jeremy Macdonald MRN: [id number] Date of Birth: Apr 16, 1926  Today's Date: 07/02/2017 PT Individual Time: 5041-3643 PT Individual Time Calculation (min): 8 min   Short Term Goals: Week 1:  PT Short Term Goal 1 (Week 1): Pt will negotiate 4 steps with B rails and mod assist. PT Short Term Goal 1 - Progress (Week 1): Not met PT Short Term Goal 2 (Week 1): Pt will complete sit<>stand transfers with min assist. PT Short Term Goal 2 - Progress (Week 1): Progressing toward goal(inconsistent progress; requires max<>min assist & dependent upon height of surface) PT Short Term Goal 3 (Week 1): Pt will consistently complete stand pivot transfers with min assist. PT Short Term Goal 3 - Progress (Week 1): Not met PT Short Term Goal 4 (Week 1): Pt will ambulate 75 ft with LRAD & min assist. PT Short Term Goal 4 - Progress (Week 1): Met  Skilled Therapeutic Interventions/Progress Updates:  Pt received in bed with RN in room administering meds. Pt denied c/o pain but reported significant fatigue following earlier therapy sessions. Reviewed schedule with pt and encouraged him to participate in therapy (use nu-step, ambulate, or perform bed level exercises). Pt continued to decline participation at this time 2/2 fatigue. Educated pt on additional session today & pt agreeable to participating later. Pt left in bed with all needs met.  Therapy Documentation Precautions:  Precautions Precautions: Fall Precaution Comments: monitor HR Restrictions Weight Bearing Restrictions: No General: PT Amount of Missed Time (min): 37 Minutes PT Missed Treatment Reason: Patient unwilling to participate;Patient fatigue   See Function Navigator for Current Functional Status.   Therapy/Group: Individual Therapy  [name] 07/02/2017, 2:30 PM

## 2017-07-02 NOTE — Progress Notes (Signed)
Occupational Therapy Session Note  Patient Details  Name: Jeremy Macdonald MRN: [id number] Date of Birth: 1925/12/10  Today's Date: 07/02/2017 OT Individual Time: 0915-1000 OT Individual Time Calculation (min): 45 min    Short Term Goals: Week 1:  OT Short Term Goal 1 (Week 1): Pt will transfer with BSC/toilet with min A with LRAD OT Short Term Goal 2 (Week 1): Pt will bathe 10/10 parts with min A sit to stand OT Short Term Goal 3 (Week 1): Pt will be oriented x4 with min environmental cues OT Short Term Goal 4 (Week 1): Pt will dress UB with setup   Skilled Therapeutic Interventions/Progress Updates:    Upon entering the room, pt supine in bed with RN giving medications. Pt with no c/o pain this session and encouraged to attempt to void. Pt standing from elevated bed with min A with mod cues for hand placement and sequence. Pt ambulating 10' with RW to bathroom with steady assistance. Pt performed clothing management with min A standing balance. Pt able to void with increased time and performed hygiene while seated for safety. Pt standing and required assistance with clothing management secondary to posterior lean after toileting. Pt returning to sit in wheelchair for hand hygiene. OT addressing transitional movement and use of RW for transfers to chair. Pt required mod multimodal cues for RW advancement, staying side of RW, hand placement, and sequencing. Pt ambulating from room to dayroom 28' with RW and steady assistance. Pt sitting in wheelchair to rest secondary to fatigue. OT assisted pt to SLP room via wheelchair with family present. Pt transitioned to SLP session without issue.  Therapy Documentation Precautions:  Precautions Precautions: Fall Precaution Comments: monitor HR Restrictions Weight Bearing Restrictions: No General: General PT Missed Treatment Reason: Patient unwilling to participate;Patient fatigue Vital Signs: Therapy Vitals Temp: 98.4 F (36.9 C) Temp  Source: Oral Pulse Rate: 84 Resp: 16 BP: 110/71 Patient Position (if appropriate): Lying Oxygen Therapy SpO2: 96 % O2 Device: Not Delivered ADL: ADL ADL Comments: see functional navigator  See Function Navigator for Current Functional Status.   Therapy/Group: Individual Therapy  Gypsy Decant 07/02/2017, 4:00 PM

## 2017-07-03 ENCOUNTER — Inpatient Hospital Stay (HOSPITAL_COMMUNITY): Payer: Medicare Other | Admitting: Occupational Therapy

## 2017-07-03 ENCOUNTER — Inpatient Hospital Stay (HOSPITAL_COMMUNITY): Payer: Medicare Other | Admitting: Physical Therapy

## 2017-07-03 ENCOUNTER — Inpatient Hospital Stay (HOSPITAL_COMMUNITY): Payer: Medicare Other | Admitting: Speech Pathology

## 2017-07-03 NOTE — Progress Notes (Signed)
Occupational Therapy Weekly Progress Note  Patient Details  Name: Jeremy Macdonald MRN: [id number] Date of Birth: 06-16-1925  Beginning of progress report period: June 26, 2017 End of progress report period: July 03, 2017  Today's Date: 07/03/2017 OT Individual Time:  0815- 0927   72 minutes of skilled OT intervention   Patient has met 3 of 4 short term goals. Pt is making slow progress towards Occupational Therapy goals this week. Pt barriers this week have been fatigue, pain, and changes in cognition. Caregiver has been present for several therapy sessions for observation. Pt's functional transfers and self care ranges from min - mod A depending on level of fatigue. Pt able to cross ankle to opposite knee to increase I in LB dressing. Min A standing balance for clothing management and hygiene with toileting and shower. Pt continues to benefit from OT intervention  Patient continues to demonstrate the following deficits: muscle weakness, decreased cardiorespiratoy endurance, decreased initiation, decreased attention, decreased awareness, decreased problem solving, decreased safety awareness, decreased memory and delayed processing and decreased sitting balance, decreased standing balance and decreased balance strategies and therefore will continue to benefit from skilled OT intervention to enhance overall performance with BADL.  Patient not progressing toward long term goals.  See goal revision..  Plan of care revisions: downgraded to overall min A for safety.  OT Short Term Goals Week 1:  OT Short Term Goal 1 (Week 1): Pt will transfer with BSC/toilet with min A with LRAD OT Short Term Goal 1 - Progress (Week 1): Met OT Short Term Goal 2 (Week 1): Pt will bathe 10/10 parts with min A sit to stand OT Short Term Goal 2 - Progress (Week 1): Partly met OT Short Term Goal 3 (Week 1): Pt will be oriented x4 with min environmental cues OT Short Term Goal 3 - Progress (Week 1): Met OT  Short Term Goal 4 (Week 1): Pt will dress UB with setup  OT Short Term Goal 4 - Progress (Week 1): Met Week 2:  OT Short Term Goal 1 (Week 2): STGs=LTGs secondary to upcoming discharge  Skilled Therapeutic Interventions/Progress Updates:    Upon entering the room, pt seated in wheelchair upon entering the room with Wife present briefly. Pt with no c/o pain and agreeable to OT intervention. Pt standing from wheelchair with min A and min verbal cues for hand placement. Pt ambulating 10' into bathroom with RW and min A with verbal cues for RW advancement safely. Pt seated on toilet and able to void with increased time. Pt removing clothing while seated on toilet for safety. Min A stand pivot transfer onto TTB to wash at shower level. Pt able to cross ankle to opposite knee to wash B LE's and feet this session with steady assistance for balance. Pt standing with use of grab bar with min A and therapist assisting to wash buttocks. Pt transferred to toilet to don clothing items with min cues for clothing orientation. Pt returning to sit in wheelchair after dressing with reported extreme fatigue. Pt taking rest break and then performed grooming tasks at sink with set up A. Pt remained in wheelchair with quick release belt donned upon exiting the room.   Therapy Documentation Precautions:  Precautions Precautions: Fall Precaution Comments: monitor HR Restrictions Weight Bearing Restrictions: (P) No General:   Vital Signs: Therapy Vitals Temp: 98.2 F (36.8 C) Temp Source: Oral Pulse Rate: 84 Resp: 17 BP: 133/78 Patient Position (if appropriate): Lying Oxygen Therapy SpO2: 94 % O2  Device: Not Delivered Pain:   ADL: ADL ADL Comments: see functional navigator Vision   Perception    Praxis   Exercises:   Other Treatments:    See Function Navigator for Current Functional Status.   Therapy/Group: Individual Therapy  Gypsy Decant 07/03/2017, 4:02 PM

## 2017-07-03 NOTE — Progress Notes (Signed)
[place] PHYSICAL MEDICINE & REHABILITATION     PROGRESS NOTE    Subjective/Complaints: Pt up at RN station this morning. No major issues overnight.   ROS: pt denies nausea, vomiting, diarrhea, cough, shortness of breath or chest pain   Objective: Vital Signs: Blood pressure 128/77, pulse 68, temperature 98.2 F (36.8 C), temperature source Oral, resp. rate 16, height 5\' 9"  (1.753 m), weight 75 kg (165 lb 5.5 oz), SpO2 95 %. No results found. No results for input(s): WBC, HGB, HCT, PLT in the last 72 hours. No results for input(s): NA, K, CL, GLUCOSE, BUN, CREATININE, CALCIUM in the last 72 hours.  Invalid input(s): CO CBG (last 3)  No results for input(s): GLUCAP in the last 72 hours.  Wt Readings from Last 3 Encounters:  07/03/17 75 kg (165 lb 5.5 oz)  06/25/17 78.3 kg (172 lb 9.6 oz)  04/25/17 80.2 kg (176 lb 12.9 oz)    Physical Exam:  Constitutional: He is oriented to person, place, and time. He appears well-developed and well-nourished. No distress.    Facial wounds healing/scabs HENT:  Head: Normocephalic and atraumatic.  Mouth/Throat: Oropharynx is clear and moist.  Nasal bridge with abrasion   Eyes: EOM are normal. Pupils are equal, round, and reactive to light.  Neck: Normal range of motion. Neck supple.  Cardiovascular: RRR without murmur. No JVD     Respiratory. CTA Bilaterally without wheezes or rales. Normal effort   GI: Soft. Bowel sounds are normal. He exhibits no distension. There is no tenderness.  Musculoskeletal: He exhibits edema.    Some pain and swelling near the right knee   Neurological: oriented to hospital. Follows commands.impaired insight and awareness HOH.   Motor: B/l UE 4/5 proximal to distal B/l LE: HF 2/5, KE 2+/5, ADF/PF 4+/5  -no change Psych: Pleasant     Assessment/Plan: 1.  Gait disorder and cognitive deficits secondary to secondary to recent fall and likely traumatic brain injury as well as debility which require 3+ hours  per day of interdisciplinary therapy in a comprehensive inpatient rehab setting. Physiatrist is providing close team supervision and 24 hour management of active medical problems listed below. Physiatrist and rehab team continue to assess barriers to discharge/monitor patient progress toward functional and medical goals.  Function:  Bathing Bathing position   Position: Shower  Bathing parts Body parts bathed by patient: Right arm, Left arm, Chest, Abdomen, Right upper leg, Left upper leg, Front perineal area, Right lower leg, Left lower leg Body parts bathed by helper: Buttocks, Back  Bathing assist Assist Level: Touching or steadying assistance(Pt > 75%)      Upper Body Dressing/Undressing Upper body dressing   What is the patient wearing?: Pull over shirt/dress     Pull over shirt/dress - Perfomed by patient: Thread/unthread left sleeve, Put head through opening, Pull shirt over trunk, Thread/unthread right sleeve Pull over shirt/dress - Perfomed by helper: Thread/unthread right sleeve Button up shirt - Perfomed by patient: Thread/unthread right sleeve Button up shirt - Perfomed by helper: Thread/unthread left sleeve, Pull shirt around back, Button/unbutton shirt    Upper body assist Assist Level: Supervision or verbal cues      Lower Body Dressing/Undressing Lower body dressing   What is the patient wearing?: Non-skid slipper socks, Pants   Underwear - Performed by helper: Thread/unthread right underwear leg, Thread/unthread left underwear leg, Pull underwear up/down Pants- Performed by patient: Thread/unthread right pants leg, Thread/unthread left pants leg, Pull pants up/down, Fasten/unfasten pants Pants- Performed by  helper: Pull pants up/down, Thread/unthread right pants leg, Thread/unthread left pants leg Non-skid slipper socks- Performed by patient: Don/doff right sock, Don/doff left sock Non-skid slipper socks- Performed by helper: Don/doff right sock, Don/doff left  sock Socks - Performed by patient: Don/doff right sock, Don/doff left sock   Shoes - Performed by patient: Don/doff right shoe, Don/doff left shoe, Fasten right, Fasten left            Lower body assist Assist for lower body dressing: Touching or steadying assistance (Pt > 75%)      Toileting Toileting   Toileting steps completed by patient: Performs perineal hygiene, Adjust clothing prior to toileting Toileting steps completed by helper: Adjust clothing after toileting Toileting Assistive Devices: Grab bar or rail  Toileting assist Assist level: (mod A)   Transfers Chair/bed transfer   Chair/bed transfer method: Stand pivot Chair/bed transfer assist level: Moderate assist (Pt 50 - 74%/lift or lower) Chair/bed transfer assistive device: Armrests, Medical sales representative     Max distance: 60' Assist level: Touching or steadying assistance (Pt > 75%)   Wheelchair Wheelchair activity did not occur: Safety/medical concerns Type: Manual Max wheelchair distance: 150 ft Assist Level: Supervision or verbal cues  Cognition Comprehension Comprehension assist level: Understands basic 50 - 74% of the time/ requires cueing 25 - 49% of the time  Expression Expression assist level: Expresses basic 75 - 89% of the time/requires cueing 10 - 24% of the time. Needs helper to occlude trach/needs to repeat words.  Social Interaction Social Interaction assist level: Interacts appropriately 75 - 89% of the time - Needs redirection for appropriate language or to initiate interaction.  Problem Solving Problem solving assist level: Solves basic 25 - 49% of the time - needs direction more than half the time to initiate, plan or complete simple activities  Memory Memory assist level: Recognizes or recalls 50 - 74% of the time/requires cueing 25 - 49% of the time   Medical Problem List and Plan: 1.  Deficits in mobility and self-care secondary to debility and TBI,.   -Continue PT, OT, ST.  Showing some cognitive improvement, but inconsistent    -team conf today  2.  DVT Prophylaxis/Anticoagulation: --on Xarelto.  3. Pain Management: continue ultram prn--- limit to avoid neuro sedating side effects 4. Mood: Ego support 5. Neuropsych: This patient is? Not  capable of making decisions on his own behalf.   -  Seroquel  8 PM nightly for sleep and sundowning    -PRN Seroquel available as needed for severe agitation as well.       6. Skin/Wound Care: pressure relief measures.  R   -Remove sutures today from nose  7. Fluids/Electrolytes/Nutrition: Encourage p.o. intake.  I thoroughly reviewed patient's labs today.  Need to bruised nutrition and protein based on low albumin.   -Continue protein supplement      8. A fib with RVR: on amiodarone and metoprolol. Has resolved --started on lanoxin today and Cardizem titrated upwards to 60 mg tid. Monitor for orthostatic symptoms.  9.  Urinary retention:    -Continue Avodart and Flomax     -Foley removed.  PVR's 200-300 most recently.    -want patient on the toilet or bedside commode     -urine still malodorous, 1/25 ucs negative, will repeat today 10. Bilateral Knee OA: Right knee x-ray reviewed and shows mild arthritis as well as chondrocalcinosis.    11. Dizziness: Likely due to A flutter. . 12. Abnormal LFTs:  Likely due to shocked liver and resolving hematoma.    -LFTs now essentially within normal limits  LOS (Days) 8 A FACE TO FACE EVALUATION WAS PERFORMED  Meredith Staggers, MD 07/03/2017 9:10 AM

## 2017-07-03 NOTE — Progress Notes (Signed)
Physical Therapy Session Note  Patient Details  Name: Jeremy Macdonald MRN: [id number] Date of Birth: 1926-02-17  Today's Date: 07/03/2017 PT Individual Time: 9735-3299 and 1337-1401 and 1533-1600 PT Individual Time Calculation (min): 55 min and 24 min (makeup time) and 27 min  Short Term Goals: Week 2:  PT Short Term Goal 1 (Week 2): Pt will initiate stair negotiation for strengthening purposes. PT Short Term Goal 2 (Week 2): Pt will consistently complete sit<>stand transfers with min assist.  Skilled Therapeutic Interventions/Progress Updates:  Treatment 1: Pt agreeable to tx, denying c/o pain. Recreational Therapist present for first half of session. Session focused on sit<>stand transfers, balance, activity tolerance and cognitive remediation. Pt engaged in standing ball toss and kicking beach ball with 1 or 2 UE support and min assist for standing balance. Pt required frequent rest breaks 2/2 fatigue. Pt completed car transfer at low sedan simulated height with mod assist for sit>stand and cuing for overall technique for increased safety. Pt utilized UE to assist BLE in/out of car 2/2 LE weakness. Throughout session pt completed sit>stand transfers from w/c with min assist and extra time with occasional cuing for hand placement. Pt engaged in card matching on Velcro board while standing with min assist for balance. Pt requires max cuing to make correct matches. Throughout session pt noted fatigue & required frequent seated rest breaks. At end of session pt ambulated within room to recliner with RW and min assist. Therapist filled out memory notebook and reviewed schedule with pt; pt unable to recall events of this therapy session. Pt left in recliner with QRB donned & in care of nursing student & teacher.  Treatment 2: Pt seen to make up missed time from yesterday. Pt received in bed & agreeable to tx, no c/o pain reported during session. Pt completes sidelying>sitting EOB with min assist  with HOB elevated & bed rails. Pt remains cognitively impaired stating "I think I saw some of your folks walking across the yard. What are you doing? Fixing up your house?" with therapist providing max assist for orientation. Pt completes stand pivot transfer bed>w/c with RW & min assist. Transported pt to gym where he negotiated 8 steps (3") with B rails and min assist for strengthening. Pt returned to room and ambulated to recliner with RW. Reviewed use of call bell with pt but pt with poor carryover. Pt left in recliner with BLE elevated & QRB & chair alarm donned, call bell within reach.  Treatment 3: Pt received in bed & agreeable to tx, no c/o pain reported. Pt transferred supine>sitting EOB with supervision, HOB slightly elevated & bed rails with cuing to utilize rails. Pt completes sit>stand transfers throughout session with min assist from elevated surface but requires mod assist from low bed.  Pt able to stand to allow therapist to adjust brief as pt had partially removed it. Pt completed bed>w/c and bed>recliner via stand pivot with min assist and max cuing for hand placement and sequencing. Pt performed hand hygiene at sink from w/c level with cuing to locate soap. Transported pt to gym via w/c total assist for time management. Pt negotiated 4 steps (6") with B rails and min assist; pt with impaired balance overall but especially when turning at the top. Pt ambulated back to room with RW & steady assist. At end of session pt left in recliner with BLE elevated, QRB & chair alarm donned & call bell within reach. Reviewed use of call bell with pt. Pt able to recall  events from this session & therapist filled out memory book.   Therapy Documentation Precautions:  Precautions Precautions: Fall Precaution Comments: monitor HR Restrictions Weight Bearing Restrictions: (P) No   See Function Navigator for Current Functional Status.   Therapy/Group: Individual Therapy  [name] 07/03/2017, 4:32 PM

## 2017-07-03 NOTE — Discharge Instructions (Addendum)
Inpatient Rehab Discharge Instructions  Jeremy Macdonald Discharge date and time: 07/10/17   Activities/Precautions/ Functional Status: Activity: no lifting, driving, or strenuous exercise till cleared by MD Diet: cardiac diet--Low salt Wound Care: Foley care twice a day.   Functional status:  ___ No restrictions     ___ Walk up steps independently _X__ 24/7 supervision/assistance   ___ Walk up steps with assistance ___ Intermittent supervision/assistance  ___ Bathe/dress independently ___ Walk with walker     _X__ Bathe/dress with assistance ___ Walk Independently    ___ Shower independently _X__ Walk with assistance    ___ Shower with assistance _X__ No alcohol     ___ Return to work/school ________   COMMUNITY REFERRALS UPON DISCHARGE:    Home Health:   PT     OT     ST    RN    SW                    Agency:  [facility] Phone: [phone]   Medical Equipment/Items Ordered:  Wheelchair, cushion, hospital bed, bedside commode and rolling walker                                                      Agency/Supplier:  [facility] @ [phone]   Special Instructions: 1. Family to offer fluids/supplements 2-3 times a day to help with intake and prevent dehydration. 2. Needs hands on assist with all activity.  3. Family needs to administer medications.  4. Contact urologist if urine gets bloody or for problems with foley.    My questions have been answered and I understand these instructions. I will adhere to these goals and the provided educational materials after my discharge from the hospital.  Patient/Caregiver Signature _______________________________ Date __________  Clinician Signature _______________________________________ Date __________   Please bring this form and your medication list with you to all your follow-up doctor's appointments. Information on my medicine - XARELTO (Rivaroxaban)  This medication education was reviewed with me or my  healthcare representative as part of my discharge preparation.    Why was Xarelto prescribed for you? Xarelto was prescribed for you to reduce the risk of a blood clot forming that can cause a stroke if you have a medical condition called atrial fibrillation (a type of irregular heartbeat).  What do you need to know about xarelto ? Take your Xarelto ONCE DAILY at the same time every day with your evening meal. If you have difficulty swallowing the tablet whole, you may crush it and mix in applesauce just prior to taking your dose.  Take Xarelto exactly as prescribed by your doctor and DO NOT stop taking Xarelto without talking to the doctor who prescribed the medication.  Stopping without other stroke prevention medication to take the place of Xarelto may increase your risk of developing a clot that causes a stroke.  Refill your prescription before you run out.  After discharge, you should have regular check-up appointments with your healthcare provider that is prescribing your Xarelto.  In the future your dose may need to be changed if your kidney function or weight changes by a significant amount.  What do you do if you miss a dose? If you are taking Xarelto ONCE DAILY and you miss a dose, take it as soon as you  remember on the same day then continue your regularly scheduled once daily regimen the next day. Do not take two doses of Xarelto at the same time or on the same day.   Important Safety Information A possible side effect of Xarelto is bleeding. You should call your healthcare provider right away if you experience any of the following: ? Bleeding from an injury or your nose that does not stop. ? Unusual colored urine (red or dark brown) or unusual colored stools (red or black). ? Unusual bruising for unknown reasons. ? A serious fall or if you hit your head (even if there is no bleeding).  Some medicines may interact with Xarelto and might increase your risk of bleeding  while on Xarelto. To help avoid this, consult your healthcare provider or pharmacist prior to using any new prescription or non-prescription medications, including herbals, vitamins, non-steroidal anti-inflammatory drugs (NSAIDs) and supplements.  This website has more information on Xarelto: [email or web address]

## 2017-07-03 NOTE — Progress Notes (Signed)
Speech Language Pathology Weekly Progress and Session Note  Patient Details  Name: Jeremy Macdonald MRN: [id number] Date of Birth: 27-May-1926  Beginning of progress report period: June 26, 2017 End of progress report period: July 03, 2017  Today's Date: 07/03/2017 SLP Individual Time: 1062-6948 SLP Individual Time Calculation (min): 45 min  Short Term Goals: Week 1: SLP Short Term Goal 1 (Week 1): Patient will demonstrate functional problem solving for basic and familiar tasks with Mod A verbal cues.  SLP Short Term Goal 1 - Progress (Week 1): Met SLP Short Term Goal 2 (Week 1): Patient will demonstrate sustained attention to functional tasks for ~10 minutes with Mod A verbal cues for redirection.  SLP Short Term Goal 2 - Progress (Week 1): Met SLP Short Term Goal 3 (Week 1): Patient will demonstrate orientation to time and place with Mod A multimodal cues.  SLP Short Term Goal 3 - Progress (Week 1): Met SLP Short Term Goal 4 (Week 1): Patient will identify 2 cognitive and 2 physical changes since admission with Max A multimodal cues.  SLP Short Term Goal 4 - Progress (Week 1): Not met    New Short Term Goals: Week 2: SLP Short Term Goal 1 (Week 2): Patient will identify 2 cognitive and 2 physical changes since admission with Max A multimodal cues.  SLP Short Term Goal 2 (Week 2): Patient will demonstrate functional problem solving for basic and familiar tasks with Min A verbal cues.  SLP Short Term Goal 3 (Week 2): Patient will demonstrate sustained attention to functional tasks for ~20 minutes with Min A verbal cues for redirection.  SLP Short Term Goal 4 (Week 2): Patient will demonstrate orientation to time and place with Min A multimodal cues.   Weekly Progress Updates: Patient has made functional and inconsistent gains and has met 3 of 4 STG's this reporting period. Currently, patient requires overall Mod A visual and verbal cues for orientation and Mod A verbal cues for  attention and problem solving with functional tasks. However, patient continues to demonstrate Max A verbal cues for intellectual awareness of deficits and intermittent confusion which impacts his overall safety with functional tasks. Patient and family education is ongoing. Patient would benefit from continued skilled SLP intervention to maximize his cognitive function and overall functional independence prior to discharge.      Intensity: Minumum of 1-2 x/day, 30 to 90 minutes Frequency: 3 to 5 out of 7 days Duration/Length of Stay: 2 weeks  Treatment/Interventions: Cognitive remediation/compensation;Environmental controls;Internal/external aids;Therapeutic Activities;Patient/family education;Functional tasks;Cueing hierarchy   Daily Session  Skilled Therapeutic Interventions:  Skilled treatment session focused on cognitive goals. Upon arrival, patient was sitting upright at the RN station due to attempting to get out bed without assistance. Patient appeared more confused this session compared to yesterday's session and required Mod A verbal cues for orientation to place and time and Max A verbal cues for use of schedule to anticipate upcoming therapy sessions. SLP also facilitated session by continuing task from yesterday's session in regards to organizing a pill box. Patient required Max A verbal cues for problem solving with task. Patient handed off to OT. Continue with current plan of care.        Function:   Cognition Comprehension Comprehension assist level: Understands basic 50 - 74% of the time/ requires cueing 25 - 49% of the time  Expression   Expression assist level: Expresses basic 75 - 89% of the time/requires cueing 10 - 24% of the time. Needs helper  to occlude trach/needs to repeat words.  Social Interaction Social Interaction assist level: Interacts appropriately 75 - 89% of the time - Needs redirection for appropriate language or to initiate interaction.  Problem Solving  Problem solving assist level: Solves basic 25 - 49% of the time - needs direction more than half the time to initiate, plan or complete simple activities  Memory Memory assist level: Recognizes or recalls 50 - 74% of the time/requires cueing 25 - 49% of the time   Pain No/Denies Pain   Therapy/Group: Individual Therapy  Royalti Schauf 07/03/2017, 8:36 AM

## 2017-07-04 ENCOUNTER — Inpatient Hospital Stay (HOSPITAL_COMMUNITY): Payer: Medicare Other | Admitting: Occupational Therapy

## 2017-07-04 ENCOUNTER — Telehealth (HOSPITAL_COMMUNITY): Payer: Self-pay

## 2017-07-04 ENCOUNTER — Inpatient Hospital Stay (HOSPITAL_COMMUNITY): Payer: Medicare Other | Admitting: Speech Pathology

## 2017-07-04 ENCOUNTER — Inpatient Hospital Stay (HOSPITAL_COMMUNITY): Payer: Medicare Other | Admitting: Physical Therapy

## 2017-07-04 ENCOUNTER — Encounter (HOSPITAL_COMMUNITY): Payer: Medicare Other | Admitting: Psychology

## 2017-07-04 DIAGNOSIS — R52 Pain, unspecified: Secondary | ICD-10-CM

## 2017-07-04 LAB — URINE CULTURE

## 2017-07-04 NOTE — Consult Note (Signed)
Neuropsychological Consultation   Patient:   Jeremy Macdonald   DOB:   May 16, 1926  MR Number:  [id number]  Location:  [facility] [address] [id number] [place] [address] Dept: [phone] GGE: [phone]           Date of Service:   07/04/2017  Start Time:   3:30 PM End Time:   4:30 PM  Provider/Observer:  Ilean Skill, Psy.D.       Clinical Neuropsychologist       Billing Code/Service: [phone] 4 Units  Chief Complaint:    GABRYEL FILES is an 84 year old male with history of CAD, dementia, COPD, HOH, fall on 06/17/17 with nasal fracture and recurrent falls due to dizziness and weakness.  While patient was diagnosed with "dementia" recently, wife and son both report that patient was doing well physically and cognitively prior to fall.  Patient had been driving but had been dealing with dizziness and weakness due to cardio issues.  Multiple falls over past few months.  Patient denies depressive or anxiety symptoms and there was no clinical data suggesting mood disorder symptoms.  Reason for Service:  JAMAREE HOSIER was referred for neuropsychological consultation due to ongoing cognitive changes following recent fall with nasal fracture.  Below is the HPI for the current admission.  HPI:   Jeremy Culbertson Dickersonis a 84 y.o.malewith history of CAD, dementia, COPD, HOH, fall on 06/17/17 with nasal fracture and recurrent falls due to dizziness and weakness. He was admitted on 06/20/17 with weakness, inability of family to care for patient and was noted to have A fib with RVR, leucocytosis and dehydration. He was started IVF due to concerns of dehydration as well as IV amiodarone and IV cardizem.  History taken from chart review and wife. Foley placed due to urinary retention and Dr. Alyson Ingles consulted due to hematuria from foley trauma. Patient with history of BPH with frequency therefore flomax  increased to bid with recommendations to keep foley in place till mobile--to start CBI if hematuria worsens.CT head reviewed, unremarkable for acute intracranial process.Therapy ongoing and patient showing decrease in dizziness and improvement in activity tolerance. CIR recommended due to functional deficits.  Current Status:  Patient displayed some deficits with mental status.  While he was oriented to person and place, there were some deficits in time and reasoning and problem solving.  Son and Wife but report these are acute changes that developed since fall.   Short term memory and recall mild to moderate impairments and the patient has deficits with accurate recall of historical information such as how or when he worked past jobs that he worked at for more than 40 years.  I asked if I had worked with him that morning, when in fact, this was the first time I had seen him.    Behavioral Observation: JEORGE Macdonald  presents as a 84 y.o.-year-old Right Caucasian Male who appeared his stated age. his dress was Appropriate and he was Well Groomed and his manners were Appropriate to the situation.  his participation was indicative of Appropriate and Redirectable behaviors.  There were any physical disabilities noted.  he displayed an appropriate level of cooperation and motivation.     Interactions:    Active Appropriate and Redirectable  Attention:   abnormal and attention span appeared shorter than expected for age  Memory:   abnormal; global memory impairment noted  Visuo-spatial:  not examined  Speech (Volume):  normal  Speech:   normal;   Thought Process:  Coherent and Disorganized  Though Content:  WNL; not suicidal  Orientation:   person, place and situation  Judgment:   Poor  Planning:   Poor  Affect:    Appropriate  Mood:    Euthymic  Insight:   Lacking  Intelligence:   high   Medical History:   Past Medical History:  Diagnosis Date  . Abdominal aortic  aneurysm (AAA) ([facility])   . Arthritis    "shoulders" (04/24/2017)  . Atrial fibrillation with RVR ([facility]) 03/2017   [name] 03/08/2017  . COPD (chronic obstructive pulmonary disease) ([facility])   . Coronary artery disease   . Dementia   . Fall at home 06/17/2017   nasal fracture  . High cholesterol   . HOH (hard of hearing)   . Hypertension   . Pneumonia ~ 2003-2008 X 5   "5 straight years S/P pneumonia shot in ~ 2003"(04/24/2017)  . Skin cancer 2018   "cut it out of right arm"       Psychiatric History:  No prior history of dementia dx prior to this month.  Family Med/Psych History:  Family History  Problem Relation Age of Onset  . Heart attack Father   . Cancer Sister   . Lung cancer Brother     Risk of Suicide/Violence: low   Impression/DX:  Jeremy Macdonald is an 84 year old male with history of CAD, dementia, COPD, HOH, fall on 06/17/17 with nasal fracture and recurrent falls due to dizziness and weakness.  While patient was diagnosed with "dementia" recently, wife and son both report that patient was doing well physically and cognitively prior to fall.  Patient had been driving but had been dealing with dizziness and weakness due to cardio issues.  Multiple falls over past few months.  Patient denies depressive or anxiety symptoms and there was no clinical data suggesting mood disorder symptoms.  Patient displayed some deficits with mental status.  While he was oriented to person and place, there were some deficits in time and reasoning and problem solving.  Son and Wife but report these are acute changes that developed since fall.   Short term memory and recall mild to moderate impairments and the patient has deficits with accurate recall of historical information such as how or when he worked past jobs that he worked at for more than 40 years.  I asked if I had worked with him that morning, when in fact, this was the first time I had seen him.    The patient's apparent sudden  cognitive changes after fall and nasal fracture without CT/MRI showing any acute process suggests that the current symptoms may be related to some post-concussive effects.  However, he has had some time with dizziness and mild altered consciousness due to cardiovascular and COPD issues.  The combination of these two factors are likely primary to the acute changes in cognitive functioning.  Disposition/Plan:  Will see the patient again next week to follow-up with neuropsych assessment.  Diagnosis:    Cognitive Deficits        Electronically Signed   _______________________ Ilean Skill, Psy.D.

## 2017-07-04 NOTE — Progress Notes (Signed)
[facility]IC[facility] & RE[facility]ILITATION     PROGRESS NOTE    Subjective/Complaints: Seems to have had a better night in general.  Up at the sink with therapy this morning without new complaints.  ROS: pt denies nausea, vomiting, diarrhea, cough, shortness of breath or chest pain   Objective: Vital Signs: Blood pressure 138/60, pulse (!) 103, temperature 98.1 F (36.7 C), temperature source Oral, resp. rate 18, height 5\' 9"  (1.753 m), weight 73.2 kg (161 lb 6 oz), SpO2 92 %. No results found. No results for input(s): WBC, HGB, HCT, PLT in the last 72 hours. No results for input(s): NA, K, CL, GLUCOSE, BUN, CREATININE, CALCIUM in the last 72 hours.  Invalid input(s): CO CBG (last 3)  No results for input(s): GLUCAP in the last 72 hours.  Wt Readings from Last 3 Encounters:  07/04/17 73.2 kg (161 lb 6 oz)  06/25/17 78.3 kg (172 lb 9.6 oz)  04/25/17 80.2 kg (176 lb 12.9 oz)    Physical Exam:  Constitutional: He is oriented to person, place, and time. He appears well-developed and well-nourished. No distress.    Facial and nose wounds healing nicel.  hENT:  Head: Normocephalic and atraumatic.  Mouth/Throat: Oropharynx is clear and moist.  Nasal bridge with abrasion resolving. Eyes: EOM are normal. Pupils are equal, round, and reactive to light.  Neck: Normal range of motion. Neck supple.  Cardiovascular: RRR without murmur. No JVD     Respiratory. CTA Bilaterally without wheezes or rales. Normal effort    GI: Soft. Bowel sounds are normal. He exhibits no distension. There is no tenderness.  Musculoskeletal: He exhibits edema right knee     Neurological: oriented to hospital and "doctor". Follows commands.impaired insight and awareness HOH.   Motor: B/l UE 4/5 proximal to distal B/l LE: HF 2/5, KE 2+/5, ADF/PF 4+/5  -stable. Psych: Pleasantly confused     Assessment/Plan: 1.  Gait disorder and cognitive deficits secondary to secondary to recent fall and likely  traumatic brain injury as well as debility which require 3+ hours per day of interdisciplinary therapy in a comprehensive inpatient rehab setting. Physiatrist is providing close team supervision and 24 hour management of active medical problems listed below. Physiatrist and rehab team continue to assess barriers to discharge/monitor patient progress toward functional and medical goals.  Function:  Bathing Bathing position   Position: Shower  Bathing parts Body parts bathed by patient: Right arm, Left arm, Chest, Abdomen, Right upper leg, Left upper leg, Front perineal area, Right lower leg, Left lower leg, Back Body parts bathed by helper: Buttocks  Bathing assist Assist Level: Touching or steadying assistance(Pt > 75%)      Upper Body Dressing/Undressing Upper body dressing   What is the patient wearing?: Pull over shirt/dress     Pull over shirt/dress - Perfomed by patient: Thread/unthread left sleeve, Put head through opening, Pull shirt over trunk, Thread/unthread right sleeve Pull over shirt/dress - Perfomed by helper: Thread/unthread right sleeve Button up shirt - Perfomed by patient: Thread/unthread right sleeve Button up shirt - Perfomed by helper: Thread/unthread left sleeve, Pull shirt around back, Button/unbutton shirt    Upper body assist Assist Level: Supervision or verbal cues, Set up   Set up : To obtain clothing/put away  Lower Body Dressing/Undressing Lower body dressing   What is the patient wearing?: Non-skid slipper socks, Pants, Shoes   Underwear - Performed by helper: Thread/unthread right underwear leg, Thread/unthread left underwear leg, Pull underwear up/down Pants- Performed by  patient: Thread/unthread right pants leg, Thread/unthread left pants leg, Pull pants up/down, Fasten/unfasten pants Pants- Performed by helper: Pull pants up/down, Thread/unthread right pants leg, Thread/unthread left pants leg Non-skid slipper socks- Performed by patient: Don/doff  right sock, Don/doff left sock Non-skid slipper socks- Performed by helper: Don/doff right sock, Don/doff left sock Socks - Performed by patient: Don/doff right sock, Don/doff left sock   Shoes - Performed by patient: Don/doff right shoe, Don/doff left shoe, Fasten right, Fasten left            Lower body assist Assist for lower body dressing: Touching or steadying assistance (Pt > 75%)      Toileting Toileting   Toileting steps completed by patient: Performs perineal hygiene, Adjust clothing prior to toileting, Adjust clothing after toileting Toileting steps completed by helper: Adjust clothing after toileting Toileting Assistive Devices: Grab bar or rail  Toileting assist Assist level: Touching or steadying assistance (Pt.75%)   Transfers Chair/bed transfer   Chair/bed transfer method: Stand pivot Chair/bed transfer assist level: Touching or steadying assistance (Pt > 75%) Chair/bed transfer assistive device: Medical sales representative     Max distance: 150 ft Assist level: Touching or steadying assistance (Pt > 75%)   Wheelchair Wheelchair activity did not occur: Safety/medical concerns Type: Manual Max wheelchair distance: 150 ft Assist Level: Supervision or verbal cues  Cognition Comprehension Comprehension assist level: Understands basic 50 - 74% of the time/ requires cueing 25 - 49% of the time  Expression Expression assist level: Expresses basic 75 - 89% of the time/requires cueing 10 - 24% of the time. Needs helper to occlude trach/needs to repeat words.  Social Interaction Social Interaction assist level: Interacts appropriately 75 - 89% of the time - Needs redirection for appropriate language or to initiate interaction.  Problem Solving Problem solving assist level: Solves basic 25 - 49% of the time - needs direction more than half the time to initiate, plan or complete simple activities  Memory Memory assist level: Recognizes or recalls 50 - 74% of the  time/requires cueing 25 - 49% of the time   Medical Problem List and Plan: 1.  Deficits in mobility and self-care secondary to debility and TBI,.   -Continue PT, OT, ST.     -Still slow to show consistent cognitive gains.  Perhaps a bit better this morning.   2.  DVT Prophylaxis/Anticoagulation: --on Xarelto.  3. Pain Management: continue ultram prn--- limit to avoid neuro sedating side effects 4. Mood: Ego support 5. Neuropsych: This patient is? Not  capable of making decisions on his own behalf.   -  Seroquel  8 PM nightly for sleep and sundowning    -PRN Seroquel available as needed for severe agitation as well.       6. Skin/Wound Care: pressure relief measures.  R   -Remove sutures today from nose  7. Fluids/Electrolytes/Nutrition: Encourage p.o. intake.  I thoroughly reviewed patient's labs today.  Need to bruised nutrition and protein based on low albumin.   -Continue protein supplement      8. A fib with RVR: on amiodarone and metoprolol. Has resolved --started on lanoxin today and Cardizem titrated upwards to 60 mg tid. Monitor for orthostatic symptoms.  9.  Urinary retention:    -Continue Avodart and Flomax     -Foley removed.  PVR's over 500 cc recently   -want patient on the toilet or bedside commode     -urine still malodorous, repeat urine culture pending 10. Bilateral  Knee OA: Right knee x-ray reviewed and shows mild arthritis as well as chondrocalcinosis.    11. Dizziness: Likely due to A flutter. . 12. Abnormal LFTs: Likely due to shocked liver and resolving hematoma.    -LFTs now essentially within normal limits  LOS (Days) 9 A FACE TO FACE EVALUATION WAS PERFORMED  Meredith Staggers, MD 07/04/2017 9:11 AM

## 2017-07-04 NOTE — Progress Notes (Signed)
Pt bladder scan 373 mL post void. Attempted I&O cath with coude cath 16Fr x2 unsuccessful attempts. Blood clots present. PA Pam Love notified. Per PA encourage fluids and attempt voiding in one hour and bladder scan. If voiding not successful, please notify PA.

## 2017-07-04 NOTE — Progress Notes (Signed)
Physical Therapy Session Note  Patient Details  Name: Jeremy Macdonald MRN: [id number] Date of Birth: [birth year]  Today's Date: 07/04/2017 PT Individual Time: 1305-1400 PT Individual Time Calculation (min): 55 min   Short Term Goals: Week 2:  PT Short Term Goal 1 (Week 2): Pt will initiate stair negotiation for strengthening purposes. PT Short Term Goal 2 (Week 2): Pt will consistently complete sit<>stand transfers with min assist.  Skilled Therapeutic Interventions/Progress Updates:  Pt received in w/c & agreeable to tx. Pt unable to recall specific therapy sessions this morning and required max<>total assist to use schedule and memory book. Therapist continued to review schedule and memory book with him. Pt completed sit>stand from w/c with close supervision<>min assist and ambulated room>apartment with RW & steady assist; pt with decreased awareness as he hit wall on L side with RW. Pt completed transfer from low, compliant couch with mod assist with cuing for hand placement; pt reported back pain during transfer that dissipated after. Pt negotiated single step (~3 inches) with RW and min assist after therapist provided demonstration and instruction. Pt ambulated back to room and was left sitting in w/c with chair alarm & QRB donned, all needs within reach. On trip back to room pt attempted to open fire extinguisher door asking "is this my room?" and pt also reports seeing a rat sitting "east" of his room -- therapist continues to provide max/total assist for orientation.  Pt continues to demonstrate decreased attention and awareness & requires frequent rest breaks 2/2 fatigue.   Therapy Documentation Precautions:  Precautions Precautions: Fall Precaution Comments: monitor HR Restrictions Weight Bearing Restrictions: No   See Function Navigator for Current Functional Status.   Therapy/Group: Individual Therapy  [name] 07/04/2017, 3:21 PM

## 2017-07-04 NOTE — Progress Notes (Signed)
Speech Language Pathology Daily Session Note  Patient Details  Name: Jeremy Macdonald MRN: [id number] Date of Birth: May 07, 1926  Today's Date: 07/04/2017 SLP Individual Time: 0730-0830 SLP Individual Time Calculation (min): 60 min  Short Term Goals: Week 2: SLP Short Term Goal 1 (Week 2): Patient will identify 2 cognitive and 2 physical changes since admission with Max A multimodal cues.  SLP Short Term Goal 2 (Week 2): Patient will demonstrate functional problem solving for basic and familiar tasks with Min A verbal cues.  SLP Short Term Goal 3 (Week 2): Patient will demonstrate sustained attention to functional tasks for ~20 minutes with Min A verbal cues for redirection.  SLP Short Term Goal 4 (Week 2): Patient will demonstrate orientation to time and place with Min A multimodal cues.   Skilled Therapeutic Interventions: Skilled treatment session focused on cognitive goals. Upon arrival, patient was awake while supine in bed. Patient sat EOB with Mod I and required Min A verbal cues for safety with transfer from bed to the wheelchair. Patient performed basic grooming tasks at sink with Mod I and was independently oriented to place today. Patient was Mod I for tray set-up and alternated attention between functional conversation and self-feeding with Mod I for ~15 minutes. However, patient continues to require Max A verbal cues for problem solving while organizing a pill box which is a familiar task that he performed at home, suspect due to decreased mental flexibility. Patient handed off to NT. Continue with current plan of care.      Function:   Cognition Comprehension Comprehension assist level: Understands basic 50 - 74% of the time/ requires cueing 25 - 49% of the time  Expression   Expression assist level: Expresses basic 75 - 89% of the time/requires cueing 10 - 24% of the time. Needs helper to occlude trach/needs to repeat words.  Social Interaction Social Interaction assist  level: Interacts appropriately 75 - 89% of the time - Needs redirection for appropriate language or to initiate interaction.  Problem Solving Problem solving assist level: Solves basic 50 - 74% of the time/requires cueing 25 - 49% of the time  Memory Memory assist level: Recognizes or recalls 50 - 74% of the time/requires cueing 25 - 49% of the time    Pain No/Denies Pain   Therapy/Group: Individual Therapy  Mohmmad Saleeby 07/04/2017, 10:27 AM

## 2017-07-04 NOTE — Consult Note (Signed)
Urology Consult  Referring physician: Daleen Squibb Reason for referral: Unable to pass catheter  Chief Complaint: Retention; unable to pass catheter  History of Present Illness: Patient in rehab and trouble voiding; now cannot do CIC and had blood; scan for 351 mls; recent traumatic brain injury;   History bit limited; no GU surgery and nocturia x 2; on Jalyn  No acute pain or fever  Modifying factors: There are no other modifying factors  Associated signs and symptoms: There are no other associated signs and symptoms Aggravating and relieving factors: There are no other aggravating or relieving factors Severity: Moderate Duration: Persistent  Past Medical History:  Diagnosis Date  . Abdominal aortic aneurysm (AAA) ([facility])   . Arthritis    "shoulders" (04/24/2017)  . Atrial fibrillation with RVR ([facility]) 03/2017   [name] 03/08/2017  . COPD (chronic obstructive pulmonary disease) ([facility])   . Coronary artery disease   . Dementia   . Fall at home 06/17/2017   nasal fracture  . High cholesterol   . HOH (hard of hearing)   . Hypertension   . Pneumonia ~ 2003-2008 X 5   "5 straight years S/P pneumonia shot in ~ 2003"(04/24/2017)  . Skin cancer 2018   "cut it out of right arm"   Past Surgical History:  Procedure Laterality Date  . APPENDECTOMY    . CARDIAC CATHETERIZATION    . CATARACT EXTRACTION, BILATERAL Bilateral   . CORONARY ANGIOPLASTY WITH STENT PLACEMENT    . CORONARY ATHERECTOMY N/A 04/24/2017   Procedure: CORONARY ATHERECTOMY;  Surgeon: Adrian Prows, MD;  Location: [facility];  Service: Cardiovascular;  Laterality: N/A;  . CORONARY STENT INTERVENTION N/A 04/24/2017   Procedure: CORONARY STENT INTERVENTION;  Surgeon: Adrian Prows, MD;  Location: [facility];  Service: Cardiovascular;  Laterality: N/A;  . INCISION AND DRAINAGE  1950s   "RLE; got hurt in service; had to get a bunch of stuff out"  . LEFT HEART CATH AND CORONARY ANGIOGRAPHY N/A 03/19/2017   Procedure:  LEFT HEART CATH AND CORONARY ANGIOGRAPHY;  Surgeon: Adrian Prows, MD;  Location: [facility];  Service: Cardiovascular;  Laterality: N/A;  . SKIN CANCER EXCISION Right 2018   "arms"  . TOOTH EXTRACTION      Medications: I have reviewed the patient's current medications. Allergies:  Allergies  Allergen Reactions  . Phenergan [Promethazine Hcl] Other (See Comments)    Confusion     Family History  Problem Relation Age of Onset  . Heart attack Father   . Cancer Sister   . Lung cancer Brother    Social History:  reports that he has been smoking cigarettes.  He has a 6.50 pack-year smoking history. He has quit using smokeless tobacco. He reports that he does not drink alcohol or use drugs.  ROS: All systems are reviewed and negative except as noted. Rest negative  Physical Exam:  Vital signs in last 24 hours: Temp:  [97.7 F (36.5 C)-98.1 F (36.7 C)] 97.7 F (36.5 C) (01/30 1445) Pulse Rate:  [55-103] 90 (01/30 1446) Resp:  [18] 18 (01/30 1445) BP: (125-138)/(55-76) 125/55 (01/30 1445) SpO2:  [92 %-96 %] 96 % (01/30 1445) Weight:  [73.2 kg (161 lb 6 oz)] 73.2 kg (161 lb 6 oz) (01/30 0511)  Cardiovascular: Skin warm; not flushed Respiratory: Breaths quiet; no shortness of breath Abdomen: No masses Neurological: Normal sensation to touch Musculoskeletal: Normal motor function arms and legs Lymphatics: No inguinal adenopathy Skin: No rashes Genitourinary:not distended; genitalia  normal  Laboratory Data:  Results for orders placed or performed during the hospital encounter of 06/25/17 (from the past 72 hour(s))  Urine Culture     Status: Abnormal   Collection Time: 07/03/17  9:14 AM  Result Value Ref Range   Specimen Description URINE, RANDOM    Special Requests NONE    Culture MULTIPLE SPECIES PRESENT, SUGGEST RECOLLECTION (A)    Report Status 07/04/2017 FINAL    Recent Results (from the past 240 hour(s))  Culture, Urine     Status: None   Collection Time:  06/29/17 12:05 AM  Result Value Ref Range Status   Specimen Description URINE, CATHETERIZED  Final   Special Requests NONE  Final   Culture NO GROWTH  Final   Report Status 06/30/2017 FINAL  Final  Urine Culture     Status: Abnormal   Collection Time: 07/03/17  9:14 AM  Result Value Ref Range Status   Specimen Description URINE, RANDOM  Final   Special Requests NONE  Final   Culture MULTIPLE SPECIES PRESENT, SUGGEST RECOLLECTION (A)  Final   Report Status 07/04/2017 FINAL  Final   Creatinine: Recent Labs    06/28/17 0557  CREATININE 0.88    Xrays: See report/chart none  Impression/Assessment:  14 Fr coude went in fine with clear urine/leg strap  Plan:  See me in about 2-3 weeks in office with foley; name and number given to wife  Deeandra Jerry A 07/04/2017, 3:50 PM

## 2017-07-04 NOTE — Telephone Encounter (Signed)
Called and spoke with wife of patient - Wife stated that patient is in hospital. Patient fell and hit his head. Got a concussion. Will continue to follow.

## 2017-07-04 NOTE — Progress Notes (Signed)
Per Marylin Crosby, NT patient with gross hematuria when void attempted. PA Pam Love notified and came to assess. Pt bladder scanned for 351 mL. PA will contact urologist for further instruction. Pt wife and son at bedside and educated on pt medication for prostate issues, possible need for indwelling catheter and reason for contacting urology. Both verbalized understanding.

## 2017-07-04 NOTE — Progress Notes (Signed)
Occupational Therapy Session Note  Patient Details  Name: Jeremy Macdonald MRN: [id number] Date of Birth: 1926-04-17  Today's Date: 07/04/2017 OT Individual Time: 3338-3291 and 9166-0600 OT Individual Time Calculation (min): 72 min and 16 min   Short Term Goals: Week 2:  OT Short Term Goal 1 (Week 2): STGs=LTGs secondary to upcoming discharge   Skilled Therapeutic Interventions/Progress Updates:    Session 1: Upon entering the room, pt seated in wheelchair awaiting therapist arrival. Pt requesting to change clothing items but declined shower this session. Pt ambulating to bathroom with min A and use of RW. Pt able to have BM and void urine successfully. Pt performed hygiene while seated on toilet and standing with steady assistance for balance for LB clothing management. Pt returning to wheelchair at sink for hand hygiene and dressing from wheelchair level. Pt orienting shirt and donning with set up A. Pt donning LB clothing while crossing ankle to opposite knee with increased time for problem solving. Pt required steady assistance for balance to pull over B hips. Pt expressed leisure interest of growing orchids and able to describe in great detail how to grow, when to water, botanical names, and various information about growing orchids with min verbal guidance cues. OT reviewed memory book with pt with max verbal guidance cues to review and fill out for this session. Pt remained seated in wheelchair with quick release belt donned and all needs within reach.   Session 2: Upon entering the room, pt supine in bed with wife and son present. Pt performing supine >sit with supervision and bed rail. Pt standing from bed with min A and ambulating to bathroom with steady assistance and use of RW. Pt seated to void and OT noticed blood in toilet and coming from pt's penis. RN notified and PA arrived to assess pt. Pt returned to bed in similar manner as above and 15 missed minutes of OT intervention.    Therapy Documentation Precautions:  Precautions Precautions: Fall Precaution Comments: monitor HR Restrictions Weight Bearing Restrictions: No General:   Vital Signs:  Pain: Pain Assessment Pain Assessment: No/denies pain ADL: ADL ADL Comments: see functional navigator  See Function Navigator for Current Functional Status.   Therapy/Group: Individual Therapy  Gypsy Decant 07/04/2017, 12:50 PM

## 2017-07-05 ENCOUNTER — Inpatient Hospital Stay (HOSPITAL_COMMUNITY): Payer: Medicare Other | Admitting: Physical Therapy

## 2017-07-05 ENCOUNTER — Inpatient Hospital Stay (HOSPITAL_COMMUNITY): Payer: Medicare Other | Admitting: Speech Pathology

## 2017-07-05 ENCOUNTER — Inpatient Hospital Stay (HOSPITAL_COMMUNITY): Payer: Medicare Other | Admitting: Occupational Therapy

## 2017-07-05 LAB — BASIC METABOLIC PANEL
ANION GAP: 12 (ref 5–15)
BUN: 12 mg/dL (ref 6–20)
CALCIUM: 8.5 mg/dL — AB (ref 8.9–10.3)
CO2: 26 mmol/L (ref 22–32)
CREATININE: 1.03 mg/dL (ref 0.61–1.24)
Chloride: 100 mmol/L — ABNORMAL LOW (ref 101–111)
GFR calc Af Amer: >60 mL/min (ref >60)
GLUCOSE: 152 mg/dL — AB (ref 65–99)
Potassium: 3.9 mmol/L (ref 3.5–5.1)
Sodium: 138 mmol/L (ref 135–145)

## 2017-07-05 MED ORDER — CEPHALEXIN 250 MG PO CAPS
500.0000 mg | ORAL_CAPSULE | Freq: Once | ORAL | Status: AC
  Administered 2017-07-05: 500 mg via ORAL
  Filled 2017-07-05: qty 2

## 2017-07-05 NOTE — Patient Care Conference (Signed)
Inpatient RehabilitationTeam Conference and Plan of Care Update Date: 07/03/2017   Time: 2:40 PM    Patient Name: Jeremy Macdonald Record Number: [id number]  Date of Birth: [birth year] Sex: Male         Room/Bed: 4W13C/4W13C-01 Payor Info: Payor: MEDICARE / Plan: MEDICARE PART A AND B / Product Type: *No Product type* /    Admitting Diagnosis: Fall, Debility  Admit Date/Time:  06/25/2017  4:15 PM Admission Comments: No comment available   Primary Diagnosis:  <principal problem not specified> Principal Problem: <principal problem not specified>  Patient Active Problem List   Diagnosis Date Noted  . Pain   . Debility 06/25/2017  . Chronic obstructive pulmonary disease ([place])   . Bilateral hearing loss   . Leukocytosis   . Gross hematuria   . Acute blood loss anemia   . Hypoalbuminemia   . Malnutrition of moderate degree 06/24/2017  . Dementia 06/21/2017  . Hyperglycemia 06/21/2017  . BPH (benign prostatic hyperplasia) 06/21/2017  . Dizziness 06/21/2017  . Atrial fibrillation with rapid ventricular response ([place]) 06/20/2017  . Atrial fibrillation ([place]) 06/20/2017  . Post PTCA 04/24/2017  . CAD (coronary artery disease), native coronary artery 04/22/2017  . Angina pectoris ([place]) 04/22/2017  . PAF (paroxysmal atrial fibrillation) ([place]) 03/18/2017  . Abnormal nuclear stress test 03/18/2017    Expected Discharge Date: Expected Discharge Date: 07/07/17(vs. SNF)  Team Members Present: Physician leading conference: Dr. Alger Simons Social Worker Present: Lennart Pall, LCSW Nurse Present: Benjie Karvonen, RN PT Present: Lavone Nian, PT OT Present: Benay Pillow, OT SLP Present: Weston Anna, SLP PPS Coordinator present : Daiva Nakayama, RN, CRRN     Current Status/Progress Goal Weekly Team Focus  Medical   Sleep has improved as well as sundowning although patient still with cognitive deficits.  Increase  Blood pressure control, pain management, management of  sleep   Bowel/Bladder   Continent od bowel, voiding coude cath if no voids Q4-6 hrs, vladder scan > 350cc, bladder scans has been less than < 350, PVR   Remain continent of Bowel, prevent UTI   Assess qs and prn, /peri care/ educate on    Swallow/Nutrition/ Hydration             ADL's   min A bathing, min - mod functional transfers, supervision/set up UB and grooming. Depends on fatigue, pain, and cognition  supervision  self care retraining, cognition remediation, balance, functional transfers, safety awareness   Mobility   min<>max overall, limited by fatigue and intermittent pain, can ambulate up to 100 ft with RW & min assist   goals downgraded to min assist overall  pt/family education, transfers, gait, balance, endurance, strengthening   Communication             Safety/Cognition/ Behavioral Observations  Mod-Max a  Min A  orientation, attention, problem solving, awareness    Pain   No pain verbalized  < 3 min assist  Assess QS and prn medicated and follow up    Skin   scattered btuising facial, body area  No nw skin issues/ breakage       Rehab Goals Patient on target to meet rehab goals: Yes *See Care Plan and progress notes for long and short-term goals.     Barriers to Discharge  Current Status/Progress Possible Resolutions Date Resolved   Physician    Medical stability        Supervision at home due to poor safety awareness and  memory      Nursing                  PT  Behavior;Lack of/limited family support  wife not able to provide significant physical assist upon d/c, pt confused              OT                  SLP                SW                Discharge Planning/Teaching Needs:  Wife able to provide 24/7 supervision.  Additional assist from son after work.  Teaching needs TBD   Team Discussion:  Slight improvement in sleep but still with confusion.  Slight improvement in orientation and somewhat easier to re-direct.  Min assist with OT today,  however, can worsen later in the day.  Team anticipates downgrade in goals to min assist.  SW to discuss with wife and may need to consider SNF as wife cannot provide much physical assistance.  Revisions to Treatment Plan:  Most goals to be downgraded which may lead to change in d/c plan.    Continued Need for Acute Rehabilitation Level of Care: The patient requires daily medical management by a physician with specialized training in physical medicine and rehabilitation for the following conditions: Daily direction of a multidisciplinary physical rehabilitation program to ensure safe treatment while eliciting the highest outcome that is of practical value to the patient.: Yes Daily medical management of patient stability for increased activity during participation in an intensive rehabilitation regime.: Yes Daily analysis of laboratory values and/or radiology reports with any subsequent need for medication adjustment of medical intervention for : Neurological problems;Wound care problems  Vanda Waskey 07/05/2017, 12:07 PM

## 2017-07-05 NOTE — Progress Notes (Signed)
Social Work Patient ID: Jeremy Macdonald, male   DOB: 1925-11-07, 84 y.o.   MRN: [id number]    Jeremy Macdonald  Social Worker  General Practice  Patient Care Conference  Signed  Date of Service:  07/05/2017 12:06 PM          Signed          [] Hide copied text  [] Hover for details   Inpatient RehabilitationTeam Conference and Plan of Care Update Date: 07/03/2017   Time: 2:40 PM      Patient Name: Jeremy Macdonald Record Number: [id number]  Date of Birth: 02/16/1926 Sex: Male         Room/Bed: 4W13C/4W13C-01 Payor Info: Payor: MEDICARE / Plan: MEDICARE PART A AND B / Product Type: *No Product type* /     Admitting Diagnosis: Fall, Debility  Admit Date/Time:  06/25/2017  4:15 PM Admission Comments: No comment available    Primary Diagnosis:  <principal problem not specified> Principal Problem: <principal problem not specified>       Patient Active Problem List    Diagnosis Date Noted  . Pain    . Debility 06/25/2017  . Chronic obstructive pulmonary disease ([place])    . Bilateral hearing loss    . Leukocytosis    . Gross hematuria    . Acute blood loss anemia    . Hypoalbuminemia    . Malnutrition of moderate degree 06/24/2017  . Dementia 06/21/2017  . Hyperglycemia 06/21/2017  . BPH (benign prostatic hyperplasia) 06/21/2017  . Dizziness 06/21/2017  . Atrial fibrillation with rapid ventricular response ([place]) 06/20/2017  . Atrial fibrillation ([place]) 06/20/2017  . Post PTCA 04/24/2017  . CAD (coronary artery disease), native coronary artery 04/22/2017  . Angina pectoris ([place]) 04/22/2017  . PAF (paroxysmal atrial fibrillation) ([place]) 03/18/2017  . Abnormal nuclear stress test 03/18/2017      Expected Discharge Date: Expected Discharge Date: 07/07/17(vs. SNF)   Team Members Present: Physician leading conference: Dr. Alger Simons Social Worker Present: Lennart Pall, LCSW Nurse Present: Benjie Karvonen, RN PT Present: Lavone Nian, PT OT  Present: Benay Pillow, OT SLP Present: Weston Anna, SLP PPS Coordinator present : Daiva Nakayama, RN, CRRN       Current Status/Progress Goal Weekly Team Focus  Medical     Sleep has improved as well as sundowning although patient still with cognitive deficits.  Increase  Blood pressure control, pain management, management of sleep   Bowel/Bladder     Continent od bowel, voiding coude cath if no voids Q4-6 hrs, vladder scan > 350cc, bladder scans has been less than < 350, PVR   Remain continent of Bowel, prevent UTI   Assess qs and prn, /peri care/ educate on    Swallow/Nutrition/ Hydration               ADL's     min A bathing, min - mod functional transfers, supervision/set up UB and grooming. Depends on fatigue, pain, and cognition  supervision  self care retraining, cognition remediation, balance, functional transfers, safety awareness   Mobility     min<>max overall, limited by fatigue and intermittent pain, can ambulate up to 100 ft with RW & min assist   goals downgraded to min assist overall  pt/family education, transfers, gait, balance, endurance, strengthening   Communication               Safety/Cognition/ Behavioral Observations   Mod-Max a  Min A  orientation, attention,  problem solving, awareness    Pain     No pain verbalized  < 3 min assist  Assess QS and prn medicated and follow up    Skin     scattered btuising facial, body area  No nw skin issues/ breakage        Rehab Goals Patient on target to meet rehab goals: Yes *See Care Plan and progress notes for long and short-term goals.      Barriers to Discharge   Current Status/Progress Possible Resolutions Date Resolved   Physician     Medical stability        Supervision at home due to poor safety awareness and memory      Nursing                 PT  Behavior;Lack of/limited family support  wife not able to provide significant physical assist upon d/c, pt confused              OT                 SLP             SW              Discharge Planning/Teaching Needs:  Wife able to provide 24/7 supervision.  Additional assist from son after work.  Teaching needs TBD   Team Discussion:  Slight improvement in sleep but still with confusion.  Slight improvement in orientation and somewhat easier to re-direct.  Min assist with OT today, however, can worsen later in the day.  Team anticipates downgrade in goals to min assist.  SW to discuss with wife and may need to consider SNF as wife cannot provide much physical assistance.  Revisions to Treatment Plan:  Most goals to be downgraded which may lead to change in d/c plan.    Continued Need for Acute Rehabilitation Level of Care: The patient requires daily medical management by a physician with specialized training in physical medicine and rehabilitation for the following conditions: Daily direction of a multidisciplinary physical rehabilitation program to ensure safe treatment while eliciting the highest outcome that is of practical value to the patient.: Yes Daily medical management of patient stability for increased activity during participation in an intensive rehabilitation regime.: Yes Daily analysis of laboratory values and/or radiology reports with any subsequent need for medication adjustment of medical intervention for : Neurological problems;Wound care problems   Jeremy Macdonald 07/05/2017, 12:07 PM

## 2017-07-05 NOTE — Progress Notes (Signed)
[facility] PHYSICAL MEDICINE & REHABILITATION     PROGRESS NOTE    Subjective/Complaints: States that he slept well. Denies pain. No issues with foley overnight  ROS: pt denies nausea, vomiting, diarrhea, cough, shortness of breath or chest pain    Objective: Vital Signs: Blood pressure 125/61, pulse 92, temperature 98.3 F (36.8 C), temperature source Oral, resp. rate 19, height 5\' 9"  (1.753 m), weight 72 kg (158 lb 11.7 oz), SpO2 93 %. No results found. No results for input(s): WBC, HGB, HCT, PLT in the last 72 hours. No results for input(s): NA, K, CL, GLUCOSE, BUN, CREATININE, CALCIUM in the last 72 hours.  Invalid input(s): CO CBG (last 3)  No results for input(s): GLUCAP in the last 72 hours.  Wt Readings from Last 3 Encounters:  07/05/17 72 kg (158 lb 11.7 oz)  06/25/17 78.3 kg (172 lb 9.6 oz)  04/25/17 80.2 kg (176 lb 12.9 oz)    Physical Exam:  Constitutional: He is oriented to person, place, and time. He appears well-developed and well-nourished. No distress.    Facial and nose wounds healing nicel.  hENT:  Head: Normocephalic and atraumatic.  Mouth/Throat: Oropharynx is clear and moist.  Nasal bridge with abrasion resolving. Eyes: EOM are normal. Pupils are equal, round, and reactive to light.  Neck: Normal range of motion. Neck supple.  Cardiovascular: RRR without murmur. No JVD      Respiratory. CTA Bilaterally without wheezes or rales. Normal effort     GI: Soft. Bowel sounds are normal. He exhibits no distension. There is no tenderness.  Musculoskeletal: He exhibits edema right knee     Neurological:oriented to hospital and doctor [facility].   Motor: B/l UE 4/5 proximal to distal B/l LE: HF 2+/5, KE 2+/5, ADF/PF 4+/5    Psych: Pleasantly confused     Assessment/Plan: 1.  Gait disorder and cognitive deficits secondary to secondary to recent fall and likely traumatic brain injury as well as debility which require 3+ hours per day of interdisciplinary  therapy in a comprehensive inpatient rehab setting. Physiatrist is providing close team supervision and 24 hour management of active medical problems listed below. Physiatrist and rehab team continue to assess barriers to discharge/monitor patient progress toward functional and medical goals.  Function:  Bathing Bathing position   Position: Shower  Bathing parts Body parts bathed by patient: Right arm, Left arm, Chest, Abdomen, Right upper leg, Left upper leg, Front perineal area, Right lower leg, Left lower leg, Back Body parts bathed by helper: Buttocks  Bathing assist Assist Level: Touching or steadying assistance(Pt > 75%)      Upper Body Dressing/Undressing Upper body dressing   What is the patient wearing?: Pull over shirt/dress     Pull over shirt/dress - Perfomed by patient: Thread/unthread left sleeve, Put head through opening, Pull shirt over trunk, Thread/unthread right sleeve Pull over shirt/dress - Perfomed by helper: Thread/unthread right sleeve Button up shirt - Perfomed by patient: Thread/unthread right sleeve Button up shirt - Perfomed by helper: Thread/unthread left sleeve, Pull shirt around back, Button/unbutton shirt    Upper body assist Assist Level: Supervision or verbal cues, Set up   Set up : To obtain clothing/put away  Lower Body Dressing/Undressing Lower body dressing   What is the patient wearing?: Non-skid slipper socks, Pants, Shoes   Underwear - Performed by helper: Thread/unthread right underwear leg, Thread/unthread left underwear leg, Pull underwear up/down Pants- Performed by patient: Thread/unthread right pants leg, Thread/unthread left pants leg, Pull pants up/down,  Fasten/unfasten pants Pants- Performed by helper: Pull pants up/down, Thread/unthread right pants leg, Thread/unthread left pants leg Non-skid slipper socks- Performed by patient: Don/doff right sock, Don/doff left sock Non-skid slipper socks- Performed by helper: Don/doff right  sock, Don/doff left sock Socks - Performed by patient: Don/doff right sock, Don/doff left sock   Shoes - Performed by patient: Don/doff right shoe, Don/doff left shoe, Fasten right, Fasten left            Lower body assist Assist for lower body dressing: Touching or steadying assistance (Pt > 75%)      Toileting Toileting   Toileting steps completed by patient: Performs perineal hygiene, Adjust clothing prior to toileting, Adjust clothing after toileting Toileting steps completed by helper: Adjust clothing after toileting Toileting Assistive Devices: Grab bar or rail  Toileting assist Assist level: Touching or steadying assistance (Pt.75%)   Transfers Chair/bed transfer   Chair/bed transfer method: Stand pivot Chair/bed transfer assist level: Touching or steadying assistance (Pt > 75%) Chair/bed transfer assistive device: Medical sales representative     Max distance: >150 ft Assist level: Touching or steadying assistance (Pt > 75%)   Wheelchair Wheelchair activity did not occur: Safety/medical concerns Type: Manual Max wheelchair distance: 150 ft Assist Level: Supervision or verbal cues  Cognition Comprehension Comprehension assist level: Understands basic 50 - 74% of the time/ requires cueing 25 - 49% of the time  Expression Expression assist level: Expresses basic 75 - 89% of the time/requires cueing 10 - 24% of the time. Needs helper to occlude trach/needs to repeat words.  Social Interaction Social Interaction assist level: Interacts appropriately 75 - 89% of the time - Needs redirection for appropriate language or to initiate interaction.  Problem Solving Problem solving assist level: Solves basic 50 - 74% of the time/requires cueing 25 - 49% of the time  Memory Memory assist level: Recognizes or recalls 50 - 74% of the time/requires cueing 25 - 49% of the time   Medical Problem List and Plan: 1.  Deficits in mobility and self-care secondary to debility and  TBI,.   -Continue PT, OT, ST.     -Ongoing cognitive deficits  2.  DVT Prophylaxis/Anticoagulation: --on Xarelto.  3. Pain Management: continue ultram prn--- limit to avoid neuro sedating side effects 4. Mood: Ego support 5. Neuropsych: This patient is? Not  capable of making decisions on his own behalf.   -  Seroquel  8 PM nightly for sleep and sundowning    -PRN Seroquel available as needed for severe agitation as well.       6. Skin/Wound Care: pressure relief measures.  R   -Remove sutures today from nose  7. Fluids/Electrolytes/Nutrition: Encourage p.o. intake.  I thoroughly reviewed patient's labs today.  Need to bruised nutrition and protein based on low albumin.   -Continue protein supplement      8. A fib with RVR: on amiodarone and metoprolol. Has resolved --started on lanoxin today and Cardizem titrated upwards to 60 mg tid. Monitor for orthostatic symptoms.  9.  Urinary retention:    -Continue Avodart and Flomax     -With acute increase in urine retention as well as hematuria and inability to pass the catheter, urology was consulted yesterday and saw the patient and replaced a indwelling catheter.  They will see patient back in the office in 2-3 weeks time.   -Urine still blood-tinged but clearing, urine culture with multi species 10. Bilateral Knee OA: Right knee x-ray reviewed and  shows mild arthritis as well as chondrocalcinosis.    11. Dizziness: Likely due to A flutter. . 12. Abnormal LFTs: Likely due to shocked liver and resolving hematoma.    -LFTs now essentially within normal limits  LOS (Days) 10 A FACE TO FACE EVALUATION WAS PERFORMED  Meredith Staggers, MD 07/05/2017 9:12 AM

## 2017-07-05 NOTE — Progress Notes (Addendum)
Physical Therapy Session Note  Patient Details  Name: Jeremy Macdonald MRN: [id number] Date of Birth: Jan 10, 1926  Today's Date: 07/05/2017 PT Individual Time: 1117-1200 PT Individual Time Calculation (min): 43 min   Short Term Goals: Week 2:  PT Short Term Goal 1 (Week 2): Pt will initiate stair negotiation for strengthening purposes. PT Short Term Goal 2 (Week 2): Pt will consistently complete sit<>stand transfers with min assist.  Skilled Therapeutic Interventions/Progress Updates:  Pt received in room with RN attempting to cath; pt missed 17 minutes 2/2 nursing care. Therapist returned to room & pt agreeable to tx. Pt completes sit>stand with min<>mod assist with max cuing for hand placement to push up on stable surface. Pt requires min assist for static standing balance while RN examined catheter. Pt completes stand pivot bed>w/c with RW & min assist. Focused on w/c level exercises 2/2 pain. Pt performed weight bar ball taps with 1# and 2# bar, BLE long arc quads & hip flexion with 1.5# ankle weights, hip adduction squeezes and hip abduction with orange theraband. Therapist provided max multimodal cuing & instruction for proper technique. Pt propelled w/c ~15 ft with cuing for hand placement then pt noting fatigue. At end of session pt left sitting in w/c with QRB & chair alarm donned, all needs within reach (reviewed use of call bell), & telesitter in room.   Therapy Documentation Precautions:  Precautions Precautions: Fall Precaution Comments: monitor HR Restrictions Weight Bearing Restrictions: No  General: PT Amount of Missed Time (min): 17 Minutes PT Missed Treatment Reason: Nursing care  Pain: Pt reports pain in belly "where I have to pee" & pain with catheter - focused on w/c level activities to minimize pain. During exercises pt reported anterior B knee pain & exercises were modified.  See Function Navigator for Current Functional Status.   Therapy/Group: Individual  Therapy  [name] 07/05/2017, 12:19 PM

## 2017-07-05 NOTE — Progress Notes (Signed)
Patient stood up at 0700 to transfer to chair without calling, setting off bed alarm. NT responded to alarm and patient transferred safely. Foley bag may have been pulled while patient out of bed. Red drainage noted. 1000 bag noted  not draining, PA and urology notified. Attempted irrigation with no return.  New foley catheter placed by urology around 1300, irrigated. 575 drained from bladder. Bag secured to leg. Patient tolerated procedure. Patient placed on telesitter monitoring.

## 2017-07-05 NOTE — Progress Notes (Signed)
Occupational Therapy Session Note  Patient Details  Name: Jeremy Macdonald MRN: [id number] Date of Birth: [birth year]  Today's Date: 07/05/2017 OT Individual Time: 1330-1455 OT Individual Time Calculation (min): 85 min    Short Term Goals: Week 2:  OT Short Term Goal 1 (Week 2): STGs=LTGs secondary to upcoming discharge  Skilled Therapeutic Interventions/Progress Updates:    Pt presents supine in bed agreeable to OT tx session. Pt transfers to sitting EOB with MinGuard assist, requires ModA for sit<>stand at [place] and Mod-MaxA for stand pivot to w/c as Pt with increased pain in bil LEs and attempting to sit early. Pt with pain and fatigue from earlier procedure, requiring increased assist for functional transfers and increased time to complete tasks during this session. Assisted Pt into bathroom via w/c however Pt declining showering this session. Pt completing modified UB/LB bathing/dressing seated in w/c at sink. Pt requires max redirectional cues to complete tasks. Pt dons shirt with increased time and max cues for orientation. Requires assist to thread LEs into pantlegs due to bil LE pain. Prior to LB completion Pt reporting need to toilet, completed stand pivot w/c<>BSC over toilet with [place] using grab bars, provided assist for peri-care after BM to ensure thoroughness, assist for clothing management. Attempted to have Pt stand to wash hands at sink however Pt initiating doing so from seated position. Pt transferred to sitting EOB with maxA for sit<>stand at [facility] and multimodal cues for hand placement, modA for stand pivot to EOB, modA to return to supine. Pt left supine in bed, bed alarm activated, spouse present and needs met.    Therapy Documentation Precautions:  Precautions Precautions: Fall Precaution Comments: monitor HR Restrictions Weight Bearing Restrictions: No     ADL: ADL ADL Comments: see functional navigator  See Function Navigator for Current Functional  Status.   Therapy/Group: Individual Therapy  Raymondo Band 07/05/2017, 4:41 PM

## 2017-07-05 NOTE — Progress Notes (Signed)
Social Work Patient ID: Jeremy Macdonald, male   DOB: 01/12/1926, 84 y.o.   MRN: [id number]  Met yesterday afternoon with pt's wife and son, [name].  Both very concerned with pt's level of assist and wife does not feel that she can provide minimal assistance at home.  They both request change of d/c plan to SNF for short term.  We discussed using the time in SNF to continue to look at privately hiring caregivers, adult day programs, etc.  Will begin bed search and alert tx team to change in plan.  Ricke Kimoto, LCSW

## 2017-07-05 NOTE — Progress Notes (Addendum)
Subjective: Hospitalist called a Foley catheter quit draining.  Nurses check a bladder scan and it was about 600.  She tried to irrigate the 14 [place] catheter and could not.  The patient says he needs to urinate and cannot.  He is tugging at the catheter. He had a CT scan from June 2018 which showed a benign urinary tract and about a 130 g prostate.  I reviewed all the images.  Objective: Vital signs in last 24 hours: Temp:  [97.7 F (36.5 C)-98.3 F (36.8 C)] 98.3 F (36.8 C) (01/31 0300) Pulse Rate:  [55-96] 92 (01/31 0843) Resp:  [18-19] 19 (01/31 0300) BP: (121-130)/(55-74) 125/61 (01/31 0843) SpO2:  [93 %-96 %] 93 % (01/31 0300) Weight:  [72 kg (158 lb 11.7 oz)] 72 kg (158 lb 11.7 oz) (01/31 0300)  Intake/Output from previous day: 01/30 0701 - 01/31 0700 In: 300 [P.O.:300] Out: 450 [Urine:450] Intake/Output this shift: Total I/O In: -  Out: 575 [Urine:575]  Physical Exam:  Elderly male in a wheelchair, messing with his catheter Bright red blood around the catheter at meatus and red bloody urine in the bag. Nurses placed him back in the bed.  Bladder distended on exam.  I tried to advance the catheter as it seem like it was hanging out too far.  The balloon seemed fixed.  I tried to irrigate the catheter and it would not.  I deflated the balloon and he drained some urine but it was dark amber and given the 14 fr failed, I felt like he was going to need a bigger catheter.  Procedure: He was prepped and draped in the usual sterile fashion.  I attempted to place an 29 [place] coud catheter, but this met resistance in the prostate.  Despite multiple attempts it would not pass.  Therefore, given cystoscopy was the same and potentially less invasive than a catheter because it is guided directly with visualization and smaller caliber than the catheter, he was prepped for cystoscopy. The flexible cystoscope was passed per urethra without difficulty.  The urethra was unremarkable, in the  prostatic urethra there was a false passage or some posterior trauma. The scope easily guided into the bladder and I passed a sensor wire through it.  The scope was backed out and over the sensor wire 20 [place] council tip catheter was passed without difficulty.  It was left to gravity drainage.  The balloon was filled to 16 mL to provide extra protection from slipping into the prostatic urethra.  Nurses secured the catheter.  About 600 mL strained and it was quite dark tea colored.  I irrigated the catheter to clear and there was no apparent continued bleeding and there were no clots.    Lab Results: No results for input(s): HGB, HCT in the last 72 hours. BMET Recent Labs    07/05/17 0841  NA 138  K 3.9  CL 100*  CO2 26  GLUCOSE 152*  BUN 12  CREATININE 1.03  CALCIUM 8.5*   No results for input(s): LABPT, INR in the last 72 hours. No results for input(s): LABURIN in the last 72 hours. Results for orders placed or performed during the hospital encounter of 06/25/17  Culture, Urine     Status: None   Collection Time: 06/29/17 12:05 AM  Result Value Ref Range Status   Specimen Description URINE, CATHETERIZED  Final   Special Requests NONE  Final   Culture NO GROWTH  Final   Report Status 06/30/2017 FINAL  Final  Urine Culture     Status: Abnormal   Collection Time: 07/03/17  9:14 AM  Result Value Ref Range Status   Specimen Description URINE, RANDOM  Final   Special Requests NONE  Final   Culture MULTIPLE SPECIES PRESENT, SUGGEST RECOLLECTION (A)  Final   Report Status 07/04/2017 FINAL  Final    Studies/Results: No results found.  Assessment/Plan: -BPH, urinary retention-continue Foley catheter.  Follow-up with Dr. Noah Delaine or Dr. MaDiarmid as planned. I gave him one cephalexin 500 mg po for the catheter change and cystoscopy.  Johney Maine hematuria-likely from catheter trauma.  Continue catheter.  Patient with normal imaging and no ongoing bleeding.   LOS: 10 days   Festus Aloe 07/05/2017, 2:12 PM

## 2017-07-05 NOTE — Progress Notes (Signed)
Speech Language Pathology Daily Session Note  Patient Details  Name: Jeremy Macdonald MRN: [id number] Date of Birth: 01-20-1926  Today's Date: 07/05/2017 SLP Individual Time: 0730-0825 SLP Individual Time Calculation (min): 55 min  Short Term Goals: Week 2: SLP Short Term Goal 1 (Week 2): Patient will identify 2 cognitive and 2 physical changes since admission with Max A multimodal cues.  SLP Short Term Goal 2 (Week 2): Patient will demonstrate functional problem solving for basic and familiar tasks with Min A verbal cues.  SLP Short Term Goal 3 (Week 2): Patient will demonstrate sustained attention to functional tasks for ~20 minutes with Min A verbal cues for redirection.  SLP Short Term Goal 4 (Week 2): Patient will demonstrate orientation to time and place with Min A multimodal cues.   Skilled Therapeutic Interventions: Skilled treatment session focused on cognitive goals. Upon arrival, patient was awake while supine in bed. Patient attempted sit EOB and required Max A verbal cues for problem solving with transfer and for donning brief/pants. Patient performed simple grooming tasks with Mod I. Patient also performed tray set-up with Mod I and alternated attention between self-feeding and functional conversation for ~15 minutes with Mod I. SLP also facilitated session by providing Max-Total A for problem solving in regards to reading/utilizing daily schedule and memory notebook. Patient left upright in wheelchair with quick release belt in place, chair alarm on and wife present. Continue with current plan of care.      Function:  Eating Eating   Modified Consistency Diet: No Eating Assist Level: More than reasonable amount of time;Set up assist for   Eating Set Up Assist For: Opening containers;Cutting food       Cognition Comprehension Comprehension assist level: Understands basic 50 - 74% of the time/ requires cueing 25 - 49% of the time  Expression   Expression assist level:  Expresses basic 75 - 89% of the time/requires cueing 10 - 24% of the time. Needs helper to occlude trach/needs to repeat words.  Social Interaction Social Interaction assist level: Interacts appropriately 75 - 89% of the time - Needs redirection for appropriate language or to initiate interaction.  Problem Solving Problem solving assist level: Solves basic 50 - 74% of the time/requires cueing 25 - 49% of the time  Memory Memory assist level: Recognizes or recalls 50 - 74% of the time/requires cueing 25 - 49% of the time    Pain Pain Assessment Pain Assessment: No/denies pain  Therapy/Group: Individual Therapy  Yeni Jiggetts 07/05/2017, 9:51 AM

## 2017-07-06 ENCOUNTER — Inpatient Hospital Stay (HOSPITAL_COMMUNITY): Payer: Medicare Other | Admitting: Occupational Therapy

## 2017-07-06 ENCOUNTER — Inpatient Hospital Stay (HOSPITAL_COMMUNITY): Payer: Medicare Other | Admitting: Physical Therapy

## 2017-07-06 ENCOUNTER — Inpatient Hospital Stay (HOSPITAL_COMMUNITY): Payer: Medicare Other | Admitting: Speech Pathology

## 2017-07-06 ENCOUNTER — Inpatient Hospital Stay (HOSPITAL_COMMUNITY): Payer: Medicare Other

## 2017-07-06 LAB — CBC
HEMATOCRIT: 38.1 % — AB (ref 39.0–52.0)
HEMOGLOBIN: 11.7 g/dL — AB (ref 13.0–17.0)
MCH: 29.8 pg (ref 26.0–34.0)
MCHC: 30.7 g/dL (ref 30.0–36.0)
MCV: 97.2 fL (ref 78.0–100.0)
Platelets: 280 10*3/uL (ref 150–400)
RBC: 3.92 MIL/uL — AB (ref 4.22–5.81)
RDW: 14.8 % (ref 11.5–15.5)
WBC: 14 10*3/uL — ABNORMAL HIGH (ref 4.0–10.5)

## 2017-07-06 LAB — URINALYSIS, ROUTINE W REFLEX MICROSCOPIC
Glucose, UA: NEGATIVE mg/dL
Ketones, ur: 15 mg/dL — AB
NITRITE: NEGATIVE
PROTEIN: 100 mg/dL — AB
Specific Gravity, Urine: >1.03 — ABNORMAL HIGH (ref 1.005–1.030)
pH: 5 (ref 5.0–8.0)

## 2017-07-06 LAB — BASIC METABOLIC PANEL
ANION GAP: 12 (ref 5–15)
BUN: 17 mg/dL (ref 6–20)
CHLORIDE: 98 mmol/L — AB (ref 101–111)
CO2: 26 mmol/L (ref 22–32)
CREATININE: 1.02 mg/dL (ref 0.61–1.24)
Calcium: 8.5 mg/dL — ABNORMAL LOW (ref 8.9–10.3)
GFR calc Af Amer: >60 mL/min (ref >60)
GFR calc non Af Amer: >60 mL/min (ref >60)
Glucose, Bld: 130 mg/dL — ABNORMAL HIGH (ref 65–99)
Potassium: 4.2 mmol/L (ref 3.5–5.1)
SODIUM: 136 mmol/L (ref 135–145)

## 2017-07-06 LAB — URINALYSIS, MICROSCOPIC (REFLEX)

## 2017-07-06 LAB — DIGOXIN LEVEL: DIGOXIN LVL: 0.9 ng/mL (ref 0.8–2.0)

## 2017-07-06 MED ORDER — ENSURE ENLIVE PO LIQD
237.0000 mL | Freq: Three times a day (TID) | ORAL | Status: DC
  Administered 2017-07-06 – 2017-07-10 (×16): 237 mL via ORAL

## 2017-07-06 MED ORDER — SODIUM CHLORIDE 0.45 % IV SOLN
INTRAVENOUS | Status: DC
  Administered 2017-07-06 – 2017-07-08 (×2): via INTRAVENOUS

## 2017-07-06 NOTE — Progress Notes (Signed)
Occupational Therapy Session Note  Patient Details  Name: Jeremy Macdonald MRN: [id number] Date of Birth: 05-Dec-1925  Today's Date: 07/06/2017 OT Individual Time: 5038-8828 OT Individual Time Calculation (min): 28 min    Skilled Therapeutic Interventions/Progress Updates:    1:1. Pt with RN taking vitals in room. Pointed out tea colored urine in foley bag, and RN already aware. See flow sheet for vitals. Pt BP low ad c/o dizziness. Pt grooms in seated at sink with increased time to locate items at sink with mod cueing to look on L back of sink for toothpaste. Pt brushes teeth with supervision overall. Pt remains seated to complete PVC pipe tree puzzle in shape of football goal post with cueing to identify superbowl Sunday for orientation. Pt total A to select correct pieces of puzzle, but able to assemble tree with mod qestion cues with pieces laid out on tabel. Exited session with ptseaetd in w/c and wife in room.  Therapy Documentation Precautions:  Precautions Precautions: Fall Precaution Comments: monitor HR Restrictions Weight Bearing Restrictions: No  See Function Navigator for Current Functional Status.   Therapy/Group: Individual Therapy  Tonny Branch 07/06/2017, 9:28 AM

## 2017-07-06 NOTE — Progress Notes (Addendum)
Patient hypotensive this am with reports of dizziness. Po intake reveiwed--has been 3 extremely poor at 0- 25 % with weight down to  151 lbs ( rechecked this am). Looks dehydrated. CBC with steady decline in H/H question hematuria as cause.  Could have  UTI with ongoing retention as well as problems with foley.  Will check lytes, lanoxin level as well as CBC. Start gently hydration. Add multiple supplements with meals as may do better with liquid intake--encourage wife and son to also offer during the day. Start calorie count.

## 2017-07-06 NOTE — Progress Notes (Signed)
316mL of dark tea-colored urine emptied out of foley catheter at [place]; reviewed urologist notes; contacted on-call Danella Sensing, NP; recommendation to continue monitoring and include extra note in chart for oncoming PA

## 2017-07-06 NOTE — Progress Notes (Signed)
[facility] PHYSICAL MEDICINE & REHABILITATION     PROGRESS NOTE    Subjective/Complaints: In good spirits. Up with therapy this morning. Denies any GU pain  ROS: Limited due to cognitive/behavioral     Objective: Vital Signs: Blood pressure 90/60, pulse (!) 133, temperature 98.5 F (36.9 C), temperature source Oral, resp. rate 18, height 5\' 9"  (1.753 m), weight 68.7 kg (151 lb 7.3 oz), SpO2 97 %. No results found. No results for input(s): WBC, HGB, HCT, PLT in the last 72 hours. Recent Labs    07/05/17 0841  NA 138  K 3.9  CL 100*  GLUCOSE 152*  BUN 12  CREATININE 1.03  CALCIUM 8.5*   CBG (last 3)  No results for input(s): GLUCAP in the last 72 hours.  Wt Readings from Last 3 Encounters:  07/06/17 68.7 kg (151 lb 7.3 oz)  06/25/17 78.3 kg (172 lb 9.6 oz)  04/25/17 80.2 kg (176 lb 12.9 oz)    Physical Exam:  Constitutional: He is oriented to person, place, and time. He appears well-developed and well-nourished. No distress.    Facial and nose wounds healing nicel.  hENT:  Head: Normocephalic and atraumatic.  Mouth/Throat: Oropharynx is clear and moist.  Nasal bridge with abrasion almost resolved Eyes: EOM are normal. Pupils are equal, round, and reactive to light.  Neck: Normal range of motion. Neck supple.  Cardiovascular: RRR without murmur. No JVD       Respiratory. CTA Bilaterally without wheezes or rales. Normal effort      GI: Soft. Bowel sounds are normal. He exhibits no distension. There is no tenderness.  Musculoskeletal: He exhibits edema right knee     Neurological:oriented to hospital, cooperative. Follows all simple commands HOH.   Motor: B/l UE 4/5 proximal to distal B/l LE: HF 2+/5, KE 2+/5, ADF/PF 4+/5 ---stable   Psych: Pleasantly confused     Assessment/Plan: 1.  Gait disorder and cognitive deficits secondary to secondary to recent fall and likely traumatic brain injury as well as debility which require 3+ hours per day of  interdisciplinary therapy in a comprehensive inpatient rehab setting. Physiatrist is providing close team supervision and 24 hour management of active medical problems listed below. Physiatrist and rehab team continue to assess barriers to discharge/monitor patient progress toward functional and medical goals.  Function:  Bathing Bathing position   Position: Wheelchair/chair at sink  Bathing parts Body parts bathed by patient: Right arm, Left arm, Chest, Abdomen, Left upper leg, Right lower leg, Left lower leg, Right upper leg Body parts bathed by helper: Back  Bathing assist Assist Level: Touching or steadying assistance(Pt > 75%)      Upper Body Dressing/Undressing Upper body dressing   What is the patient wearing?: Pull over shirt/dress     Pull over shirt/dress - Perfomed by patient: Thread/unthread left sleeve, Put head through opening, Thread/unthread right sleeve Pull over shirt/dress - Perfomed by helper: Pull shirt over trunk Button up shirt - Perfomed by patient: Thread/unthread right sleeve Button up shirt - Perfomed by helper: Thread/unthread left sleeve, Pull shirt around back, Button/unbutton shirt    Upper body assist Assist Level: Touching or steadying assistance(Pt > 75%)   Set up : To obtain clothing/put away  Lower Body Dressing/Undressing Lower body dressing   What is the patient wearing?: Non-skid slipper socks, Pants, Shoes   Underwear - Performed by helper: Thread/unthread right underwear leg, Thread/unthread left underwear leg, Pull underwear up/down Pants- Performed by patient: Thread/unthread right pants leg, Thread/unthread left  pants leg, Pull pants up/down, Fasten/unfasten pants Pants- Performed by helper: Pull pants up/down, Thread/unthread right pants leg, Thread/unthread left pants leg Non-skid slipper socks- Performed by patient: Don/doff right sock, Don/doff left sock Non-skid slipper socks- Performed by helper: Don/doff right sock, Don/doff left  sock Socks - Performed by patient: Don/doff right sock, Don/doff left sock   Shoes - Performed by patient: Fasten right, Fasten left Shoes - Performed by helper: Don/doff right shoe, Don/doff left shoe          Lower body assist Assist for lower body dressing: Touching or steadying assistance (Pt > 75%)      Toileting Toileting Toileting activity did not occur: No continent bowel/bladder event Toileting steps completed by patient: Performs perineal hygiene Toileting steps completed by helper: Adjust clothing prior to toileting, Adjust clothing after toileting Toileting Assistive Devices: Grab bar or rail  Toileting assist Assist level: Touching or steadying assistance (Pt.75%)   Transfers Chair/bed transfer   Chair/bed transfer method: Stand pivot Chair/bed transfer assist level: Touching or steadying assistance (Pt > 75%) Chair/bed transfer assistive device: Medical sales representative     Max distance: >150 ft Assist level: Touching or steadying assistance (Pt > 75%)   Wheelchair Wheelchair activity did not occur: Safety/medical concerns Type: Manual Max wheelchair distance: 150 ft Assist Level: Supervision or verbal cues  Cognition Comprehension Comprehension assist level: Understands basic 50 - 74% of the time/ requires cueing 25 - 49% of the time  Expression Expression assist level: Expresses basic 75 - 89% of the time/requires cueing 10 - 24% of the time. Needs helper to occlude trach/needs to repeat words.  Social Interaction Social Interaction assist level: Interacts appropriately 75 - 89% of the time - Needs redirection for appropriate language or to initiate interaction.  Problem Solving Problem solving assist level: Solves basic 50 - 74% of the time/requires cueing 25 - 49% of the time  Memory Memory assist level: Recognizes or recalls 50 - 74% of the time/requires cueing 25 - 49% of the time   Medical Problem List and Plan: 1.  Deficits in mobility and  self-care secondary to debility and TBI,.   -Continue PT, OT, ST.     -Ongoing cognitive deficits. More lucid sometimes than other   -SNF pending  2.  DVT Prophylaxis/Anticoagulation: --on Xarelto.  3. Pain Management: continue ultram prn--- limit to avoid neuro sedating side effects 4. Mood: Ego support 5. Neuropsych: This patient is? Not  capable of making decisions on his own behalf.   -  Seroquel  8 PM nightly for sleep and sundowning    -PRN Seroquel available as needed for severe agitation as well.       6. Skin/Wound Care: pressure relief measures.  R   -Remove sutures from nose  7. Fluids/Electrolytes/Nutrition: Encourage p.o. intake.  I thoroughly reviewed patient's labs today.  Need to bruised nutrition and protein based on low albumin.   -Continue protein supplement      8. A fib with RVR: on amiodarone and metoprolol. Has resolved --started on lanoxin today and Cardizem titrated upwards to 60 mg tid. Monitor for orthostatic symptoms.  9.  Urinary retention/hematuria:    -Continue Avodart and Flomax     -Urine still blood-tinged but generally clearing, urine culture with multi species   -check HGB today as this hasn't been checked since 1/26 10. Bilateral Knee OA: Right knee x-ray reviewed and shows mild arthritis as well as chondrocalcinosis.    11. Dizziness:  Likely due to A flutter. . 12. Abnormal LFTs: Likely due to shocked liver and resolving hematoma.    -LFTs now essentially within normal limits  LOS (Days) 11 A FACE TO FACE EVALUATION WAS PERFORMED  Meredith Staggers, MD 07/06/2017 9:52 AM

## 2017-07-06 NOTE — Progress Notes (Signed)
Pt's BP 90/60 automatically,85/59 manually.Pam Love PAC has been notified,orders were received.Keep monitoring pt. closely

## 2017-07-06 NOTE — Progress Notes (Addendum)
Physical Therapy Session Note  Patient Details  Name: Jeremy Macdonald MRN: [id number] Date of Birth: 09/24/1925  Today's Date: 07/06/2017 PT Individual Time: 5631-4970 PT Individual Time Calculation (min): 48 min   Short Term Goals: Week 2:  PT Short Term Goal 1 (Week 2): Pt will initiate stair negotiation for strengthening purposes. PT Short Term Goal 2 (Week 2): Pt will consistently complete sit<>stand transfers with min assist.  Skilled Therapeutic Interventions/Progress Updates:  Pt received in w/c & agreeable to tx. Reviewed pt's past therapy sessions with total cuing to utilize memory book. Pt able to recall therapist's name with min cuing on this date.  Transported pt to gym via w/c total assist for time management. Attempted sit>stand but pt unable even with max assist 2/2 BLE and UE pain. PA & RN arrived with PA reporting pt needs to limit participation 2/2 low BP (please see flow sheet for vitals during session). Pt returned to room & attempted w/c>bed transfer via squat pivot but pt unable to initiate movement. Pt ultimately transferred w/c>bed with Clarise Cruz lift; pt with very minimal participation with sit>stand transfer. Pt required total assist for sit>supine. At end of session pt left in bed in care of RN.  Therapy Documentation Precautions:  Precautions Precautions: Fall Precaution Comments: monitor HR Restrictions Weight Bearing Restrictions: No General: PT Amount of Missed Time (min): 12 Minutes PT Missed Treatment Reason: Patient fatigue(medical)   See Function Navigator for Current Functional Status.   Therapy/Group: Individual Therapy  [name] 07/06/2017, 12:28 PM

## 2017-07-06 NOTE — Progress Notes (Signed)
Initial Nutrition Assessment  DOCUMENTATION CODES:   Not applicable  INTERVENTION:  Continue Ensure Enlive po QID, each supplement provides 350 kcal and 20 grams of protein.  72 hour Calorie Count has been initiated. RD to follow up Monday 2/4 for results.   Encourage adequate PO intake.   NUTRITION DIAGNOSIS:   Increased nutrient needs related to chronic illness as evidenced by estimated needs.  GOAL:   Patient will meet greater than or equal to 90% of their needs  MONITOR:   PO intake, Supplement acceptance, Labs, Weight trends, I & O's, Skin  REASON FOR ASSESSMENT:   Consult Calorie Count  ASSESSMENT:   84 y.o. male with history of CAD, dementia, COPD, HOH, fall on 06/17/17 with nasal fracture and recurrent falls due to dizziness and weakness. He was admitted on 06/20/17 with weakness, inability of family to care for patient and was noted to have A fib with RVR, leucocytosis and dehydration. Therapy ongoing and patient showing decrease in dizziness and improvement in activity tolerance. CIR recommended due to functional deficits.    72 hour calorie count has been initiated by PA due to poor po intake. Pt with a 12% weight loss over the past 10 days per weight record since rehab admission. Question if fluid related as per PA note, pt dehydrated. IV fluids have been initiated. Meal completion has been 25-75% with 75% at lunch today. Pt reports having a good appetite currently and PTA with usual consumption of at least 3 meals a day with no difficulties. Pt additionally has Ensure ordered QID and has been starting to consume them. RD to continue with current orders. RD to follow up Monday with calorie count results.   NUTRITION - FOCUSED PHYSICAL EXAM:  Depletion may be related to the natural aging process.    Most Recent Value  Orbital Region  Mild depletion  Upper Arm Region  Mild depletion  Thoracic and Lumbar Region  No depletion  Buccal Region  Mild depletion  Temple  Region  Moderate depletion  Clavicle Bone Region  Moderate depletion  Clavicle and Acromion Bone Region  Moderate depletion  Scapular Bone Region  Unable to assess  Dorsal Hand  Moderate depletion  Patellar Region  Moderate depletion  Anterior Thigh Region  Moderate depletion  Posterior Calf Region  Moderate depletion  Edema (RD Assessment)  None  Hair  Reviewed  Eyes  Reviewed  Mouth  Reviewed  Skin  Reviewed  Nails  Reviewed       Diet Order:  Diet 2 gram sodium Room service appropriate? Yes; Fluid consistency: Thin  EDUCATION NEEDS:   Not appropriate for education at this time  Skin:  Skin Assessment: Reviewed RN Assessment  Last BM:  1/30  Height:   Ht Readings from Last 1 Encounters:  06/25/17 5\' 9"  (1.753 m)    Weight:   Wt Readings from Last 1 Encounters:  07/06/17 151 lb 7.3 oz (68.7 kg)    Ideal Body Weight:  72.7 kg  BMI:  Body mass index is 22.37 kg/m.  Estimated Nutritional Needs:   Kcal:  1700-1900  Protein:  75-85 grams  Fluid:  Per MD    Corrin Parker, MS, RD, LDN Pager # [phone] After hours/ weekend pager # [phone]

## 2017-07-06 NOTE — Progress Notes (Signed)
Occupational Therapy Session Note  Patient Details  Name: Jeremy Macdonald MRN: [id number] Date of Birth: November 22, 1925  Today's Date: 07/06/2017 OT Individual Time: 7395-8441 OT Individual Time Calculation (min): 71 min   Short Term Goals: Week 2:  OT Short Term Goal 1 (Week 2): STGs=LTGs secondary to upcoming discharge  Skilled Therapeutic Interventions/Progress Updates:    Pt greeted supine in bed, asleep, easily woken. Initially refusing therapy but amenable to bedlevel exercises in supine and with HOB raised. Instructed him on technique for modified crunches and bridges for core strengthening, with min vcs for accurate counting for 10-20 reps, multiple sets each exercise. Pt grimacing when OT assisted with proper placement of LEs for bridges. He reports pain R LE> L LE. RN made aware. Pt participating in yoga-influenced UB stretches/exercises for increasing UE strength and endurance. No c/o UE pain at this time and reported stretches felt good. He required rest after 10-15 reps but tolerated multiple sets. Provided him with water once [facility] was raised appropriately. Pt able to boost himself up in bed with instruction and gentle HOH placement for hands. Unable to recall any events from therapies this AM, just that he felt "terrible" and had to return to bed. BP monitored throughout session and was [facility], recorded in flowsheet. RN made aware. At end of tx pt was repositioned for comfort and left with all needs within reach, bed alarm activated, and visitor present.     Therapy Documentation Precautions:  Precautions Precautions: Fall Precaution Comments: monitor HR Restrictions Weight Bearing Restrictions: No Vital Signs: Therapy Vitals Temp: 99.1 F (37.3 C) Temp Source: Oral Pulse Rate: 77 Resp: 18 BP: 126/60 Patient Position (if appropriate): Lying Oxygen Therapy SpO2: 94 % O2 Device: Not Delivered Pain: Bilateral LEs at times. RN made aware   ADL: ADL ADL Comments: see  functional navigator    See Function Navigator for Current Functional Status.   Therapy/Group: Individual Therapy  Daleyza Gadomski A Harshika Mago 07/06/2017, 5:36 PM

## 2017-07-06 NOTE — Progress Notes (Signed)
Speech Language Pathology Daily Session Note  Patient Details  Name: Jeremy Macdonald MRN: [id number] Date of Birth: Jul 09, 1925  Today's Date: 07/06/2017 SLP Individual Time: 0730-0830 SLP Individual Time Calculation (min): 60 min  Short Term Goals: Week 2: SLP Short Term Goal 1 (Week 2): Patient will identify 2 cognitive and 2 physical changes since admission with Max A multimodal cues.  SLP Short Term Goal 2 (Week 2): Patient will demonstrate functional problem solving for basic and familiar tasks with Min A verbal cues.  SLP Short Term Goal 3 (Week 2): Patient will demonstrate sustained attention to functional tasks for ~20 minutes with Min A verbal cues for redirection.  SLP Short Term Goal 4 (Week 2): Patient will demonstrate orientation to time and place with Min A multimodal cues.   Skilled Therapeutic Interventions: Skilled treatment session focused on cognitive goals. Upon arrival, patient was awake while supine in bed. Patient attempted sit EOB and required Max A verbal cues for problem solving with transfer and for donning brief/pants. Today, patient had intermittent reports of pain in his right wrist and knee. Patient performed simple grooming tasks with Mod I. Patient also performed tray set-up with Mod I and alternated attention between self-feeding and functional conversation for ~15 minutes with Mod I. SLP also facilitated session by providing Max-Total A for problem solving in regards to reading/utilizing daily schedule and memory notebook to anticipate upcoming sessions and recall events from previous day. Patient left upright in wheelchair with quick release belt in place, chair alarm on and all needs within reach. Continue with current plan of care.        Function:  Eating Eating   Modified Consistency Diet: No Eating Assist Level: More than reasonable amount of time           Cognition Comprehension Comprehension assist level: Understands basic 50 - 74% of the  time/ requires cueing 25 - 49% of the time  Expression   Expression assist level: Expresses basic 75 - 89% of the time/requires cueing 10 - 24% of the time. Needs helper to occlude trach/needs to repeat words.  Social Interaction Social Interaction assist level: Interacts appropriately 75 - 89% of the time - Needs redirection for appropriate language or to initiate interaction.  Problem Solving Problem solving assist level: Solves basic 25 - 49% of the time - needs direction more than half the time to initiate, plan or complete simple activities  Memory Memory assist level: Recognizes or recalls 25 - 49% of the time/requires cueing 50 - 75% of the time    Pain Pain in right wrist and right knee, patient repositioned   Therapy/Group: Individual Therapy  Ginelle Bays 07/06/2017, 12:54 PM

## 2017-07-07 ENCOUNTER — Inpatient Hospital Stay (HOSPITAL_COMMUNITY): Payer: Medicare Other | Admitting: Occupational Therapy

## 2017-07-07 DIAGNOSIS — Z87820 Personal history of traumatic brain injury: Secondary | ICD-10-CM

## 2017-07-07 LAB — CBC
HCT: 33.5 % — ABNORMAL LOW (ref 39.0–52.0)
HEMOGLOBIN: 10.5 g/dL — AB (ref 13.0–17.0)
MCH: 30.3 pg (ref 26.0–34.0)
MCHC: 31.3 g/dL (ref 30.0–36.0)
MCV: 96.5 fL (ref 78.0–100.0)
PLATELETS: 236 10*3/uL (ref 150–400)
RBC: 3.47 MIL/uL — AB (ref 4.22–5.81)
RDW: 14.7 % (ref 11.5–15.5)
WBC: 11 10*3/uL — ABNORMAL HIGH (ref 4.0–10.5)

## 2017-07-07 NOTE — Progress Notes (Signed)
Occupational Therapy Session Note  Patient Details  Name: Jeremy Macdonald MRN: [id number] Date of Birth: July 30, 1925  Today's Date: 07/07/2017 OT Individual Time: 4037-0964 and 3838-1840 OT Individual Time Calculation (min): 45 min and 46 min  Short Term Goals: Week 1:  OT Short Term Goal 1 (Week 1): Pt will transfer with BSC/toilet with min A with LRAD OT Short Term Goal 1 - Progress (Week 1): Met OT Short Term Goal 2 (Week 1): Pt will bathe 10/10 parts with min A sit to stand OT Short Term Goal 2 - Progress (Week 1): Partly met OT Short Term Goal 3 (Week 1): Pt will be oriented x4 with min environmental cues OT Short Term Goal 3 - Progress (Week 1): Met OT Short Term Goal 4 (Week 1): Pt will dress UB with setup  OT Short Term Goal 4 - Progress (Week 1): Met Week 2:  OT Short Term Goal 1 (Week 2): STGs=LTGs secondary to upcoming discharge  Skilled Therapeutic Interventions/Progress Updates:    Pt greeted supine in bed, appearing restless. Family present. He transitioned to EOB with min guard and then ambulated with Min A and RW into bathroom. Spouse pushing IV pole. Pt then transferred to toilet and had successful BM with watery consistency. RN made aware. Pt required cues for sustained attention to voiding as he initially piled up many pieces of toilet paper on top of dispenser while seated on toilet. He required Max A for toileting tasks due to confusion and unsteadiness with unilateral support on RW today. Pt then ambulated back to w/c with max cues for DME mgt. Once seated in w/c pt completed UB/LB dressing with assist for elevating pants over hips once standing with RW. Pt was left up in w/c and set up for lunch prior to exit. All needs within reach and family present.   2nd Session 1:1 tx (46 min) Pt greeted sitting up in w/c with safety belt fastened. Agreeable to tx. He self propelled to dayroom with max cues for technique/sustained attention as pt would try to propel w/c with 2  hands on 1 wheel. Min A for avoiding environmental barriers and maneuvering around corners due to cognition. Once in dayroom, pt engaging in table washing task for improving standing balance, endurance, and general strengthening. Pt able to stand with Min A, and sidestepped with bilateral UE support on table. Pt bending out of base of support without LOBs, washing 3 tables in total with min vcs for safety when transferring back to w/c for rest. He then self propelled back to room in manner as written above. Pt left in w/c with safety belt fastened and all needs within reach.   Therapy Documentation Precautions:  Precautions Precautions: Fall Precaution Comments: monitor HR Restrictions Weight Bearing Restrictions: No Pain: No c/o pain during session    ADL: ADL ADL Comments: see functional navigator    See Function Navigator for Current Functional Status.   Therapy/Group: Individual Therapy  Shianna Bally A Shilo Pauwels 07/07/2017, 12:56 PM

## 2017-07-07 NOTE — Progress Notes (Signed)
Subjective: [name] is s/p difficult foley placement for retention with a urethral false passage and hematuria.  He has BPH with a very large prostate and is on avodart and tamsulosin.   He is tolerating the foley well.  The urine is still dark but no active bleeding or clots. .   ROS:  Review of Systems  Constitutional: Negative for fever.  Gastrointestinal: Negative for abdominal pain.    Anti-infectives: Anti-infectives (From admission, onward)   Start     Dose/Rate Route Frequency Ordered Stop   07/05/17 1415  cephALEXin (KEFLEX) capsule 500 mg    Comments:  To cover for cystoscopy and Foley catheter placement   500 mg Oral Once 07/05/17 1412 07/05/17 1528      Current Facility-Administered Medications  Medication Dose Route Frequency Provider Last Rate Last Dose  . 0.45 % sodium chloride infusion   Intravenous Continuous Meredith Staggers, MD 50 mL/hr at 07/06/17 1117    . acetaminophen (TYLENOL) tablet 325-650 mg  325-650 mg Oral Q4H PRN Bary Leriche, PA-C   650 mg at 07/06/17 1046  . alum & mag hydroxide-simeth (MAALOX/MYLANTA) 200-200-20 MG/5ML suspension 30 mL  30 mL Oral Q4H PRN Love, Pamela S, PA-C      . amiodarone (PACERONE) tablet 200 mg  200 mg Oral Daily Adrian Prows, MD   200 mg at 07/07/17 1443  . atorvastatin (LIPITOR) tablet 10 mg  10 mg Oral Q2000 Bary Leriche, PA-C   10 mg at 07/06/17 2139  . bisacodyl (DULCOLAX) suppository 10 mg  10 mg Rectal Daily PRN Love, Pamela S, PA-C      . digoxin (LANOXIN) tablet 0.125 mg  0.125 mg Oral Daily Bary Leriche, PA-C   0.125 mg at 07/07/17 1540  . diltiazem (CARDIZEM) tablet 60 mg  60 mg Oral Q8H LoveIvan Anchors, PA-C   60 mg at 07/07/17 0640  . diphenhydrAMINE (BENADRYL) 12.5 MG/5ML elixir 12.5-25 mg  12.5-25 mg Oral Q6H PRN Bary Leriche, PA-C   25 mg at 07/07/17 0032  . docusate sodium (COLACE) capsule 100 mg  100 mg Oral BID PRN Love, Pamela S, PA-C      . dutasteride (AVODART) capsule 0.5 mg  0.5 mg Oral  Daily Love, Pamela S, PA-C   0.5 mg at 07/07/17 0825   And  . tamsulosin (FLOMAX) capsule 0.4 mg  0.4 mg Oral Daily Bary Leriche, PA-C   0.4 mg at 07/07/17 0867  . feeding supplement (ENSURE ENLIVE) (ENSURE ENLIVE) liquid 237 mL  237 mL Oral TID WC & HS Bary Leriche, PA-C   237 mL at 07/07/17 0831  . guaiFENesin-dextromethorphan (ROBITUSSIN DM) 100-10 MG/5ML syrup 5-10 mL  5-10 mL Oral Q6H PRN Bary Leriche, PA-C   10 mL at 07/03/17 2137  . lidocaine (XYLOCAINE) 2 % jelly   Topical PRN Bary Leriche, PA-C   5 application at 61/95/09 0455  . metoprolol tartrate (LOPRESSOR) tablet 50 mg  50 mg Oral BID Bary Leriche, PA-C   50 mg at 07/07/17 3267  . multivitamin with minerals tablet 1 tablet  1 tablet Oral Daily Bary Leriche, PA-C   1 tablet at 07/07/17 1245  . ondansetron (ZOFRAN) tablet 4 mg  4 mg Oral Q6H PRN Love, Pamela S, PA-C       Or  . ondansetron (ZOFRAN) injection 4 mg  4 mg Intravenous Q6H PRN Love, Pamela S, PA-C      . polyethylene glycol (  MIRALAX / GLYCOLAX) packet 17 g  17 g Oral Daily PRN Love, Pamela S, PA-C      . polyethylene glycol (MIRALAX / GLYCOLAX) packet 17 g  17 g Oral Daily Bary Leriche, PA-C   17 g at 07/07/17 7494  . QUEtiapine (SEROQUEL) tablet 25 mg  25 mg Oral Q2000 Meredith Staggers, MD   25 mg at 07/06/17 2140  . QUEtiapine (SEROQUEL) tablet 25 mg  25 mg Oral Q12H PRN Meredith Staggers, MD   25 mg at 06/28/17 2344  . Rivaroxaban (XARELTO) tablet 15 mg  15 mg Oral Q supper Bary Leriche, PA-C   15 mg at 07/06/17 1702  . sodium phosphate (FLEET) 7-19 GM/118ML enema 1 enema  1 enema Rectal Once PRN Love, Pamela S, PA-C      . sorbitol 70 % solution 30 mL  30 mL Oral Daily PRN Meredith Staggers, MD   30 mL at 07/06/17 2141  . traMADol (ULTRAM) tablet 25 mg  25 mg Oral Q6H PRN Meredith Staggers, MD   25 mg at 07/07/17 0206     Objective: Vital signs in last 24 hours: Temp:  [98.7 F (37.1 C)-99.1 F (37.3 C)] 98.7 F (37.1 C) (02/02 0330) Pulse  Rate:  [77-89] 89 (02/02 0823) Resp:  [18-20] 20 (02/02 0330) BP: (108-138)/(59-77) 108/59 (02/02 0823) SpO2:  [94 %] 94 % (02/02 0330) Weight:  [151 lb 7.3 oz (68.7 kg)] 151 lb 7.3 oz (68.7 kg) (02/01 1024)  Intake/Output from previous day: 02/01 0701 - 02/02 0700 In: 1100 [P.O.:600; I.V.:500] Out: -  Intake/Output this shift: Total I/O In: 1177 [P.O.:120; I.V.:1057] Out: -    Physical Exam  Constitutional: He is well-developed, well-nourished, and in no distress.  Vitals reviewed.   Lab Results:  Recent Labs    07/06/17 1020 07/07/17 0636  WBC 14.0* 11.0*  HGB 11.7* 10.5*  HCT 38.1* 33.5*  PLT 280 236   BMET Recent Labs    07/05/17 0841 07/06/17 1020  NA 138 136  K 3.9 4.2  CL 100* 98*  CO2 26 26  GLUCOSE 152* 130*  BUN 12 17  CREATININE 1.03 1.02  CALCIUM 8.5* 8.5*   PT/INR No results for input(s): LABPROT, INR in the last 72 hours. ABG No results for input(s): PHART, HCO3 in the last 72 hours.  Invalid input(s): PCO2, PO2  Studies/Results: No results found.  Labs reviewed.    Assessment and Plan: Urinary retention with hematuria.  Foley now draining well but urine still dark and consistent with old blood.    Irrigate foley prn.  He will need office f/u 1-2 weeks post discharge.  Please call [phone] to arrange.       LOS: 12 days    Jeremy Macdonald 07/07/2017 [phone]

## 2017-07-07 NOTE — Progress Notes (Signed)
[facility]     PROGRESS NOTE    Subjective/Complaints: Appreciate urology note  ROS: Limited due to cognitive/behavioral     Objective: Vital Signs: Blood pressure (!) 108/59, pulse 89, temperature 98.7 F (37.1 C), temperature source Oral, resp. rate 20, height 5\' 9"  (1.753 m), weight 68.7 kg (151 lb 7.3 oz), SpO2 94 %. No results found. Recent Labs    07/06/17 1020 07/07/17 0636  WBC 14.0* 11.0*  HGB 11.7* 10.5*  HCT 38.1* 33.5*  PLT 280 236   Recent Labs    07/05/17 0841 07/06/17 1020  NA 138 136  K 3.9 4.2  CL 100* 98*  GLUCOSE 152* 130*  BUN 12 17  CREATININE 1.03 1.02  CALCIUM 8.5* 8.5*   CBG (last 3)  No results for input(s): GLUCAP in the last 72 hours.  Wt Readings from Last 3 Encounters:  07/06/17 68.7 kg (151 lb 7.3 oz)  06/25/17 78.3 kg (172 lb 9.6 oz)  04/25/17 80.2 kg (176 lb 12.9 oz)    Physical Exam:  Constitutional: He is oriented to person,  He appears well-developed and well-nourished. No distress.    Facial and nose wounds healing nicel.  hENT:  Head: Normocephalic and atraumatic.  Mouth/Throat: Oropharynx is clear and moist.  Nasal bridge with abrasion almost resolved Eyes: EOM are normal. Pupils are equal, round, and reactive to light.  Neck: Normal range of motion. Neck supple.  Cardiovascular: RRR without murmur. No JVD       Respiratory. CTA Bilaterally without wheezes or rales. Normal effort      GI: Soft. Bowel sounds are normal. He exhibits no distension. There is no tenderness.  Musculoskeletal: He exhibits edema right knee     Neurological:oriented to person. Follows all simple commands HOH.   Motor: B/l UE 4/5 proximal to distal B/l LE: HF 2+/5, KE 2+/5, ADF/PF 4+/5 ---stable   Psych: Pleasantly confused     Assessment/Plan: 1.  Gait disorder and cognitive deficits secondary to secondary to recent fall and likely traumatic brain injury as well as debility which require 3+ hours per  day of interdisciplinary therapy in a comprehensive inpatient rehab setting. Physiatrist is providing close team supervision and 24 hour management of active medical problems listed below. Physiatrist and rehab team continue to assess barriers to discharge/monitor patient progress toward functional and medical goals.  Function:  Bathing Bathing position   Position: Wheelchair/chair at sink  Bathing parts Body parts bathed by patient: Right arm, Left arm, Chest, Abdomen, Left upper leg, Right lower leg, Left lower leg, Right upper leg Body parts bathed by helper: Back  Bathing assist Assist Level: Touching or steadying assistance(Pt > 75%)      Upper Body Dressing/Undressing Upper body dressing   What is the patient wearing?: Pull over shirt/dress     Pull over shirt/dress - Perfomed by patient: Thread/unthread left sleeve, Put head through opening, Thread/unthread right sleeve Pull over shirt/dress - Perfomed by helper: Pull shirt over trunk Button up shirt - Perfomed by patient: Thread/unthread right sleeve Button up shirt - Perfomed by helper: Thread/unthread left sleeve, Pull shirt around back, Button/unbutton shirt    Upper body assist Assist Level: Touching or steadying assistance(Pt > 75%)   Set up : To obtain clothing/put away  Lower Body Dressing/Undressing Lower body dressing   What is the patient wearing?: Non-skid slipper socks, Pants, Shoes   Underwear - Performed by helper: Thread/unthread right underwear leg, Thread/unthread left underwear leg, Pull underwear  up/down Pants- Performed by patient: Thread/unthread right pants leg, Thread/unthread left pants leg, Pull pants up/down, Fasten/unfasten pants Pants- Performed by helper: Pull pants up/down, Thread/unthread right pants leg, Thread/unthread left pants leg Non-skid slipper socks- Performed by patient: Don/doff right sock, Don/doff left sock Non-skid slipper socks- Performed by helper: Don/doff right sock, Don/doff  left sock Socks - Performed by patient: Don/doff right sock, Don/doff left sock   Shoes - Performed by patient: Fasten right, Fasten left Shoes - Performed by helper: Don/doff right shoe, Don/doff left shoe          Lower body assist Assist for lower body dressing: Touching or steadying assistance (Pt > 75%)      Toileting Toileting Toileting activity did not occur: No continent bowel/bladder event Toileting steps completed by patient: Performs perineal hygiene Toileting steps completed by helper: Adjust clothing prior to toileting, Adjust clothing after toileting Toileting Assistive Devices: Grab bar or rail  Toileting assist Assist level: Touching or steadying assistance (Pt.75%)   Transfers Chair/bed transfer   Chair/bed transfer method: Stand pivot Chair/bed transfer assist level: 2 helpers Chair/bed transfer assistive device: Mechanical lift Mechanical lift: Primary school teacher     Max distance: >150 ft Assist level: Touching or steadying assistance (Pt > 75%)   Wheelchair Wheelchair activity did not occur: Safety/medical concerns Type: Manual Max wheelchair distance: 150 ft Assist Level: Supervision or verbal cues  Cognition Comprehension Comprehension assist level: Understands basic 50 - 74% of the time/ requires cueing 25 - 49% of the time  Expression Expression assist level: Expresses basic 50 - 74% of the time/requires cueing 25 - 49% of the time. Needs to repeat parts of sentences.  Social Interaction Social Interaction assist level: Interacts appropriately 50 - 74% of the time - May be physically or verbally inappropriate.  Problem Solving Problem solving assist level: Solves basic 25 - 49% of the time - needs direction more than half the time to initiate, plan or complete simple activities  Memory Memory assist level: Recognizes or recalls 25 - 49% of the time/requires cueing 50 - 75% of the time   Medical Problem List and Plan: 1.  Deficits in  mobility and self-care secondary to debility and TBI,.   -Continue PT, OT, ST.     -Ongoing cognitive deficits. More lucid sometimes than other   -SNF pending, wife does not feel like she can handle him at home  2.  DVT Prophylaxis/Anticoagulation: --on Xarelto.  3. Pain Management: continue ultram prn--- limit to avoid neuro sedating side effects 4. Mood: Ego support 5. Neuropsych: This patient is? Not  capable of making decisions on his own behalf.   -  Seroquel  8 PM nightly for sleep and sundowning    -PRN Seroquel available as needed for severe agitation as well.       6. Skin/Wound Care: pressure relief measures.  R   -Remove sutures from nose  7. Fluids/Electrolytes/Nutrition: Encourage p.o. intake.  I thoroughly reviewed patient's labs today.  Need to bruised nutrition and protein based on low albumin.   -Continue protein supplement      8. A fib with RVR: on amiodarone and metoprolol. Has resolved --started on lanoxin today and Cardizem titrated upwards to 60 mg tid. Monitor for orthostatic symptoms.  Vitals:   07/07/17 0330 07/07/17 0823  BP: 114/74 (!) 108/59  Pulse: 84 89  Resp: 20   Temp: 98.7 F (37.1 C)   SpO2: 94%   Heart rate controlled, blood pressure  on low side but no dizziness with sitting 9.  Urinary retention/hematuria:    -Continue Avodart and Flomax     -Urine still blood-tinged but generally clearing, urine culture with multi species   -hemoglobin lower at 10.5 g but this likely reflects IV hydration 10. Bilateral Knee OA: Right knee x-ray reviewed and shows mild arthritis as well as chondrocalcinosis.    11. Dizziness: Likely due to A flutter. . 12. Abnormal LFTs: Likely due to shocked liver and resolving hematoma.    -LFTs now essentially within normal limits  LOS (Days) 12 A FACE TO FACE EVALUATION WAS PERFORMED  Charlett Blake, MD 07/07/2017 1:19 PM

## 2017-07-08 LAB — BASIC METABOLIC PANEL
Anion gap: 10 (ref 5–15)
BUN: 18 mg/dL (ref 6–20)
CALCIUM: 7.9 mg/dL — AB (ref 8.9–10.3)
CO2: 29 mmol/L (ref 22–32)
CREATININE: 0.84 mg/dL (ref 0.61–1.24)
Chloride: 97 mmol/L — ABNORMAL LOW (ref 101–111)
GLUCOSE: 116 mg/dL — AB (ref 65–99)
Potassium: 3.6 mmol/L (ref 3.5–5.1)
Sodium: 136 mmol/L (ref 135–145)

## 2017-07-08 LAB — CBC
HCT: 31.5 % — ABNORMAL LOW (ref 39.0–52.0)
Hemoglobin: 9.9 g/dL — ABNORMAL LOW (ref 13.0–17.0)
MCH: 30.2 pg (ref 26.0–34.0)
MCHC: 31.4 g/dL (ref 30.0–36.0)
MCV: 96 fL (ref 78.0–100.0)
PLATELETS: 231 10*3/uL (ref 150–400)
RBC: 3.28 MIL/uL — AB (ref 4.22–5.81)
RDW: 14.6 % (ref 11.5–15.5)
WBC: 8.5 10*3/uL (ref 4.0–10.5)

## 2017-07-08 LAB — URINE CULTURE: CULTURE: NO GROWTH

## 2017-07-08 NOTE — Progress Notes (Signed)
[facility]ILIT[facility]     PROGRESS NOTE    Subjective/Complaints: Patient is visiting with his son and with his wife.  He remembers me from yesterday.  Discussed blood pressure medications with nursing.  Holding metoprolol  ROS: Limited due to cognitive/behavioral     Objective: Vital Signs: Blood pressure 101/80, pulse 73, temperature 97.9 F (36.6 C), temperature source Oral, resp. rate 15, height 5\' 9"  (1.753 m), weight 68.7 kg (151 lb 7.3 oz), SpO2 95 %. No results found. Recent Labs    07/07/17 0636 07/08/17 0318  WBC 11.0* 8.5  HGB 10.5* 9.9*  HCT 33.5* 31.5*  PLT 236 231   Recent Labs    07/06/17 1020 07/08/17 0318  NA 136 136  K 4.2 3.6  CL 98* 97*  GLUCOSE 130* 116*  BUN 17 18  CREATININE 1.02 0.84  CALCIUM 8.5* 7.9*   CBG (last 3)  No results for input(s): GLUCAP in the last 72 hours.  Wt Readings from Last 3 Encounters:  07/06/17 68.7 kg (151 lb 7.3 oz)  06/25/17 78.3 kg (172 lb 9.6 oz)  04/25/17 80.2 kg (176 lb 12.9 oz)    Physical Exam:  Constitutional: He is oriented to person,  He appears well-developed and well-nourished. No distress.    Facial and nose wounds healing nicel.  hENT:  Head: Normocephalic and atraumatic.  Mouth/Throat: Oropharynx is clear and moist.  Nasal bridge with abrasion almost resolved Eyes: EOM are normal. Pupils are equal, round, and reactive to light.  Neck: Normal range of motion. Neck supple.  Cardiovascular: RRR without murmur. No JVD       Respiratory. CTA Bilaterally without wheezes or rales. Normal effort      GI: Soft. Bowel sounds are normal. He exhibits no distension. There is no tenderness.  Musculoskeletal: He exhibits edema right knee     Neurological:oriented to person. Follows all simple commands HOH.   Motor: B/l UE 4/5 proximal to distal B/l LE: HF 2+/5, KE 2+/5, ADF/PF 4+/5 ---stable   Psych: Pleasantly confused     Assessment/Plan: 1.  Gait disorder and cognitive  deficits secondary to secondary to recent fall and likely traumatic brain injury as well as debility which require 3+ hours per day of interdisciplinary therapy in a comprehensive inpatient rehab setting. Physiatrist is providing close team supervision and 24 hour management of active medical problems listed below. Physiatrist and rehab team continue to assess barriers to discharge/monitor patient progress toward functional and medical goals.  Function:  Bathing Bathing position   Position: Wheelchair/chair at sink  Bathing parts Body parts bathed by patient: Right arm, Left arm, Chest, Abdomen, Left upper leg, Right lower leg, Left lower leg, Right upper leg Body parts bathed by helper: Back  Bathing assist Assist Level: Touching or steadying assistance(Pt > 75%)      Upper Body Dressing/Undressing Upper body dressing   What is the patient wearing?: Pull over shirt/dress     Pull over shirt/dress - Perfomed by patient: Thread/unthread left sleeve, Put head through opening, Thread/unthread right sleeve, Pull shirt over trunk Pull over shirt/dress - Perfomed by helper: Pull shirt over trunk Button up shirt - Perfomed by patient: Thread/unthread right sleeve Button up shirt - Perfomed by helper: Thread/unthread left sleeve, Pull shirt around back, Button/unbutton shirt    Upper body assist Assist Level: Supervision or verbal cues   Set up : To obtain clothing/put away  Lower Body Dressing/Undressing Lower body dressing   What is the  patient wearing?: Non-skid slipper socks, Pants, Shoes   Underwear - Performed by helper: Thread/unthread right underwear leg, Thread/unthread left underwear leg, Pull underwear up/down Pants- Performed by patient: Thread/unthread right pants leg, Thread/unthread left pants leg Pants- Performed by helper: Pull pants up/down, Fasten/unfasten pants Non-skid slipper socks- Performed by patient: Don/doff right sock, Don/doff left sock Non-skid slipper socks-  Performed by helper: Don/doff right sock, Don/doff left sock Socks - Performed by patient: Don/doff right sock, Don/doff left sock   Shoes - Performed by patient: Fasten right, Fasten left Shoes - Performed by helper: Don/doff right shoe, Don/doff left shoe          Lower body assist Assist for lower body dressing: Touching or steadying assistance (Pt > 75%)      Toileting Toileting Toileting activity did not occur: No continent bowel/bladder event Toileting steps completed by patient: Performs perineal hygiene Toileting steps completed by helper: Adjust clothing prior to toileting, Adjust clothing after toileting, Performs perineal hygiene Toileting Assistive Devices: Grab bar or rail  Toileting assist Assist level: Touching or steadying assistance (Pt.75%)   Transfers Chair/bed transfer   Chair/bed transfer method: Stand pivot Chair/bed transfer assist level: 2 helpers Chair/bed transfer assistive device: Mechanical lift Mechanical lift: Primary school teacher     Max distance: >150 ft Assist level: Touching or steadying assistance (Pt > 75%)   Wheelchair Wheelchair activity did not occur: Safety/medical concerns Type: Manual Max wheelchair distance: 150 ft Assist Level: Supervision or verbal cues  Cognition Comprehension Comprehension assist level: Understands basic 50 - 74% of the time/ requires cueing 25 - 49% of the time  Expression Expression assist level: Expresses basic 50 - 74% of the time/requires cueing 25 - 49% of the time. Needs to repeat parts of sentences.  Social Interaction Social Interaction assist level: Interacts appropriately 50 - 74% of the time - May be physically or verbally inappropriate.  Problem Solving Problem solving assist level: Solves basic 25 - 49% of the time - needs direction more than half the time to initiate, plan or complete simple activities  Memory Memory assist level: Recognizes or recalls 25 - 49% of the time/requires cueing  50 - 75% of the time   Medical Problem List and Plan: 1.  Deficits in mobility and self-care secondary to debility and TBI,.   -Continue PT, OT, ST.     -Ongoing cognitive deficits. More lucid sometimes than other   -SNF pending, wife does not feel like she can handle him at home  2.  DVT Prophylaxis/Anticoagulation: --on Xarelto.  No evidence of bleeding 3. Pain Management: continue ultram prn--- limit to avoid neuro sedating side effects 4. Mood: Ego support 5. Neuropsych: This patient is? Not  capable of making decisions on his own behalf.   -  Seroquel  8 PM nightly for sleep and sundowning    -PRN Seroquel available as needed for severe agitation as well.       6. Skin/Wound Care: pressure relief measures.  R   -Remove sutures from nose  7. Fluids/Electrolytes/Nutrition: Encourage p.o. intake.  I thoroughly reviewed patient's labs today.  Need to bruised nutrition and protein based on low albumin.   -Continue protein supplement      8. A fib with RVR: on amiodarone and metoprolol. Has resolved --started on lanoxin today and Cardizem titrated upwards to 60 mg tid. Monitor for orthostatic symptoms.  Vitals:   07/08/17 0540 07/08/17 0905  BP: (!) 117/45 101/80  Pulse: 64 73  Resp: 15   Temp: 97.9 F (36.6 C)   SpO2: 95%   Heart rate controlled, blood pressure on low side hold metoprolol this morning 9.  Urinary retention/hematuria:    -Continue Avodart and Flomax     -Urine still blood-tinged but generally clearing, urine culture with multi species   -hemoglobin lower at 10.5 g but this likely reflects IV hydration 10. Bilateral Knee OA: Right knee x-ray reviewed and shows mild arthritis as well as chondrocalcinosis.    11. Dizziness: Likely due to A flutter. . 12. Abnormal LFTs: Likely due to shocked liver and resolving hematoma.    -LFTs now essentially within normal limits 13.  Poor p.o. intake, getting IV supplementation, cognition likely interfering with adequate intake,  trial Megace LOS (Days) 13 A FACE TO FACE EVALUATION WAS PERFORMED  Charlett Blake, MD 07/08/2017 12:28 PM

## 2017-07-09 ENCOUNTER — Inpatient Hospital Stay (HOSPITAL_COMMUNITY): Payer: Medicare Other | Admitting: Speech Pathology

## 2017-07-09 ENCOUNTER — Inpatient Hospital Stay (HOSPITAL_COMMUNITY): Payer: Medicare Other | Admitting: Physical Therapy

## 2017-07-09 ENCOUNTER — Inpatient Hospital Stay (HOSPITAL_COMMUNITY): Payer: Medicare Other | Admitting: Occupational Therapy

## 2017-07-09 LAB — BASIC METABOLIC PANEL
Anion gap: 11 (ref 5–15)
BUN: 12 mg/dL (ref 6–20)
CHLORIDE: 98 mmol/L — AB (ref 101–111)
CO2: 29 mmol/L (ref 22–32)
Calcium: 8.3 mg/dL — ABNORMAL LOW (ref 8.9–10.3)
Creatinine, Ser: 0.84 mg/dL (ref 0.61–1.24)
GFR calc Af Amer: >60 mL/min (ref >60)
GFR calc non Af Amer: >60 mL/min (ref >60)
GLUCOSE: 130 mg/dL — AB (ref 65–99)
POTASSIUM: 4.1 mmol/L (ref 3.5–5.1)
Sodium: 138 mmol/L (ref 135–145)

## 2017-07-09 LAB — CBC
HCT: 37.8 % — ABNORMAL LOW (ref 39.0–52.0)
Hemoglobin: 11.7 g/dL — ABNORMAL LOW (ref 13.0–17.0)
MCH: 29.9 pg (ref 26.0–34.0)
MCHC: 31 g/dL (ref 30.0–36.0)
MCV: 96.7 fL (ref 78.0–100.0)
Platelets: 268 10*3/uL (ref 150–400)
RBC: 3.91 MIL/uL — ABNORMAL LOW (ref 4.22–5.81)
RDW: 14.7 % (ref 11.5–15.5)
WBC: 8 10*3/uL (ref 4.0–10.5)

## 2017-07-09 MED ORDER — MEGESTROL ACETATE 400 MG/10ML PO SUSP
400.0000 mg | Freq: Two times a day (BID) | ORAL | Status: DC
  Administered 2017-07-09 – 2017-07-10 (×2): 400 mg via ORAL
  Filled 2017-07-09 (×2): qty 10

## 2017-07-09 MED ORDER — TRAMADOL HCL 50 MG PO TABS: 25.0000 mg | ORAL_TABLET | Freq: Two times a day (BID) | ORAL | 0 refills | Status: DC | PRN

## 2017-07-09 MED ORDER — AMIODARONE HCL 200 MG PO TABS: 200.0000 mg | ORAL_TABLET | Freq: Every day | ORAL | Status: DC

## 2017-07-09 MED ORDER — ACETAMINOPHEN 325 MG PO TABS
325.0000 mg | ORAL_TABLET | ORAL | Status: AC | PRN
Start: 2017-07-09 — End: ?

## 2017-07-09 MED ORDER — ENSURE ENLIVE PO LIQD: 237.0000 mL | Freq: Three times a day (TID) | ORAL | 12 refills | Status: DC

## 2017-07-09 MED ORDER — QUETIAPINE FUMARATE 25 MG PO TABS: 25.0000 mg | ORAL_TABLET | Freq: Every day | ORAL | 0 refills | Status: DC

## 2017-07-09 MED ORDER — SODIUM CHLORIDE 0.45 % IV SOLN: INTRAVENOUS | Status: AC

## 2017-07-09 MED ORDER — ADULT MULTIVITAMIN W/MINERALS CH: 1.0000 | ORAL_TABLET | Freq: Every day | ORAL | Status: AC

## 2017-07-09 MED ORDER — MEGESTROL ACETATE 400 MG/10ML PO SUSP: 400.0000 mg | Freq: Two times a day (BID) | ORAL | 0 refills | Status: DC

## 2017-07-09 NOTE — Progress Notes (Signed)
Nutrition Follow-up  DOCUMENTATION CODES:   Not applicable  INTERVENTION:   Recommend continuation of Ensure Enlive po QID, each supplement provides 350 kcal and 20 grams of protein.  Encourage adequate PO intake.   Discontinue calorie count.   NUTRITION DIAGNOSIS:   Increased nutrient needs related to chronic illness as evidenced by estimated needs; ongoing  GOAL:   Patient will meet greater than or equal to 90% of their needs; progressed  MONITOR:   PO intake, Supplement acceptance, Labs, Weight trends, I & O's, Skin  REASON FOR ASSESSMENT:   Consult Calorie Count  ASSESSMENT:   84 y.o. male with history of CAD, dementia, COPD, HOH, fall on 06/17/17 with nasal fracture and recurrent falls due to dizziness and weakness. He was admitted on 06/20/17 with weakness, inability of family to care for patient and was noted to have A fib with RVR, leucocytosis and dehydration. Therapy ongoing and patient showing decrease in dizziness and improvement in activity tolerance. CIR recommended due to functional deficits.    Meal completion has been 25-50% with 50% at lunch today. Per RN, pt intake has been improving. Pt reports having a good appetite and enjoying his food at meals. Pt currently has Ensure ordered for 4 times daily and pt has been consuming them. Plans to discharge to [facility] this afternoon. Recommend continuation of Ensure post discharge to aid in adequate nutrition needs.   Calorie Count results: 07/06/2017: Total calorie: 1050 kcal (62% of kcal needs) Total protein: 60 grams protein (80% of protein needs)  07/07/2017: Total calorie: 1400 kcal (82% of kcal needs) Total protein: 80 grams protein (100% of protein needs)  07/08/2017: Total calorie: 1554 kcal (91% of kcal needs) Total protein: 86 grams protein (100% of protein needs)  Labs and medications reviewed.   Diet Order:  Diet 2 gram sodium Room service appropriate? Yes; Fluid consistency:  Thin  EDUCATION NEEDS:   Not appropriate for education at this time  Skin:  Skin Assessment: Skin Integrity Issues: Skin Integrity Issues:: Stage II Stage II: coccyx  Last BM:  2/4  Height:   Ht Readings from Last 1 Encounters:  06/25/17 5\' 9"  (1.753 m)    Weight:   Wt Readings from Last 1 Encounters:  07/09/17 159 lb 6.3 oz (72.3 kg)    Ideal Body Weight:  72.7 kg  BMI:  Body mass index is 23.54 kg/m.  Estimated Nutritional Needs:   Kcal:  1700-1900  Protein:  75-85 grams  Fluid:  Per MD    Corrin Parker, MS, RD, LDN Pager # [phone] After hours/ weekend pager # [phone]

## 2017-07-09 NOTE — Progress Notes (Addendum)
Social Work  Discharge Note  The overall goal for the admission was met for:   Discharge location: YES - plan changed to SNF as elderly wife unable to meet care needs, however, family then declined SNF bed offer once they visited facility and changed plan back to home d/c.  Length of Stay: NO - 15 days due to changing d/c plans  Discharge activity level: Yes - minimal assistance  Home/community participation: No  Services provided included: MD, RD, PT, OT, SLP, RN, TR, Pharmacy, Neuropsych and SW  Financial Services: Medicare and Private Insurance: [place]    Follow-up services arranged:  HHRN, PT, OT, ST, SW arranged via [facility];  Hospital bed, 18x18 lightweight w/c, cushion, bedside commode arranged via [place].  Pt/family had no agency preference for either.  Comments (or additional information): See prior SW progress note about changing d/c plans. Also of note, because pt is eligible for [facility] care management program, pt was screened and met needs for "Home First" program (managed by [facility] + [facility]) which will allow CNA services to be provided on a tapering scheduled for ~ one month.  [facility] notes they can provide approx 4-8 hours per day for the first couple of weeks and then begin taper.  Pt and family made aware of this service and in agreement with referral.  Patient/Family verbalized understanding of follow-up arrangements: Yes  Individual responsible for coordination of the follow-up plan: wife  Confirmed correct DME delivered: yes/ [address], LCSW    [place], Harshitha Fretz

## 2017-07-09 NOTE — Progress Notes (Signed)
Occupational Therapy Session Note  Patient Details  Name: ROMELLE REILEY MRN: [id number] Date of Birth: [birth year]  Today's Date: 07/09/2017 OT Individual Time: 1430-1457 OT Individual Time Calculation (min): 27 min    Skilled Therapeutic Interventions/Progress Updates:    Upon entering the room, pt seated in wheelchair with Wife present in room. Pt and wife perseverating on discharge plan and requiring max cues to redirect. Pt required max cues and redirection to utilize memory book to verbalize prior sessions. OT educating caregiver on therapy expectations at next venue of care and answering questions as needed. Pt remained in wheelchair with quick release belt donned and call bell within reach upon exiting the room.    Therapy Documentation Precautions:  Precautions Precautions: Fall Precaution Comments: monitor HR Restrictions Weight Bearing Restrictions: No General:   Vital Signs: Therapy Vitals Temp: 97.8 F (36.6 C) Temp Source: Oral Pulse Rate: 68 Resp: 18 BP: 124/62 Patient Position (if appropriate): Sitting Oxygen Therapy SpO2: 95 % O2 Device: Not Delivered Pain:   ADL: ADL ADL Comments: see functional navigator Vision   Perception    Praxis   Exercises:   Other Treatments:    See Function Navigator for Current Functional Status.   Therapy/Group: Individual Therapy  Gypsy Decant 07/09/2017, 2:59 PM

## 2017-07-09 NOTE — Progress Notes (Signed)
[facility] PH[facility] MEDICINE & REHABILITATION     PROGRESS NOTE    Subjective/Complaints: No new problems overnight.  Up with staff this morning.  Denies pain or dizziness  ROS: pt denies nausea, vomiting, diarrhea, cough, shortness of breath or chest pain   Objective: Vital Signs: Blood pressure (!) 134/53, pulse 74, temperature 97.7 F (36.5 C), temperature source Oral, resp. rate (!) 21, height 5\' 9"  (1.753 m), weight 72.3 kg (159 lb 6.3 oz), SpO2 96 %. No results found. Recent Labs    07/07/17 0636 07/08/17 0318  WBC 11.0* 8.5  HGB 10.5* 9.9*  HCT 33.5* 31.5*  PLT 236 231   Recent Labs    07/06/17 1020 07/08/17 0318  NA 136 136  K 4.2 3.6  CL 98* 97*  GLUCOSE 130* 116*  BUN 17 18  CREATININE 1.02 0.84  CALCIUM 8.5* 7.9*   CBG (last 3)  No results for input(s): GLUCAP in the last 72 hours.  Wt Readings from Last 3 Encounters:  07/09/17 72.3 kg (159 lb 6.3 oz)  06/25/17 78.3 kg (172 lb 9.6 oz)  04/25/17 80.2 kg (176 lb 12.9 oz)    Physical Exam:  Constitutional: He is oriented to person,  He appears well-developed and well-nourished. No distress.    Facial and nose wounds healing nicely.  hENT:  Head: Normocephalic and atraumatic.  Mouth/Throat: Oropharynx is clear and moist.  Nasal bridge with abrasion almost resolved Eyes: EOM are normal. Pupils are equal, round, and reactive to light.  Neck: Normal range of motion. Neck supple.  Cardiovascular: RRR without murmur. No JVD        Respiratory. CTA Bilaterally without wheezes or rales. Normal effort      GI: Soft. Bowel sounds are normal. He exhibits no distension. There is no tenderness.  Musculoskeletal: He exhibits edema right knee  Uro: foley with amber urine  Neurological:oriented to person. Follows all simple commands HOH.   Motor: B/l UE 4/5 proximal to distal B/l LE: HF 2+/5, KE 2+/5, ADF/PF 4+/5 ---stable   Psych: Pleasantly confused     Assessment/Plan: 1.  Gait disorder and cognitive  deficits secondary to secondary to recent fall and likely traumatic brain injury as well as debility which require 3+ hours per day of interdisciplinary therapy in a comprehensive inpatient rehab setting. Physiatrist is providing close team supervision and 24 hour management of active medical problems listed below. Physiatrist and rehab team continue to assess barriers to discharge/monitor patient progress toward functional and medical goals.  Function:  Bathing Bathing position   Position: Wheelchair/chair at sink  Bathing parts Body parts bathed by patient: Right arm, Left arm, Chest, Abdomen, Left upper leg, Right lower leg, Left lower leg, Right upper leg Body parts bathed by helper: Back  Bathing assist Assist Level: Touching or steadying assistance(Pt > 75%)      Upper Body Dressing/Undressing Upper body dressing   What is the patient wearing?: Pull over shirt/dress     Pull over shirt/dress - Perfomed by patient: Thread/unthread left sleeve, Put head through opening, Thread/unthread right sleeve, Pull shirt over trunk Pull over shirt/dress - Perfomed by helper: Pull shirt over trunk Button up shirt - Perfomed by patient: Thread/unthread right sleeve Button up shirt - Perfomed by helper: Thread/unthread left sleeve, Pull shirt around back, Button/unbutton shirt    Upper body assist Assist Level: Supervision or verbal cues   Set up : To obtain clothing/put away  Lower Body Dressing/Undressing Lower body dressing   What is  the patient wearing?: Non-skid slipper socks, Pants, Shoes   Underwear - Performed by helper: Thread/unthread right underwear leg, Thread/unthread left underwear leg, Pull underwear up/down Pants- Performed by patient: Thread/unthread right pants leg, Thread/unthread left pants leg Pants- Performed by helper: Pull pants up/down, Fasten/unfasten pants Non-skid slipper socks- Performed by patient: Don/doff right sock, Don/doff left sock Non-skid slipper socks-  Performed by helper: Don/doff right sock, Don/doff left sock Socks - Performed by patient: Don/doff right sock, Don/doff left sock   Shoes - Performed by patient: Fasten right, Fasten left Shoes - Performed by helper: Don/doff right shoe, Don/doff left shoe          Lower body assist Assist for lower body dressing: Touching or steadying assistance (Pt > 75%)      Toileting Toileting Toileting activity did not occur: No continent bowel/bladder event Toileting steps completed by patient: Performs perineal hygiene Toileting steps completed by helper: Adjust clothing prior to toileting, Adjust clothing after toileting, Performs perineal hygiene Toileting Assistive Devices: Grab bar or rail  Toileting assist Assist level: Touching or steadying assistance (Pt.75%)   Transfers Chair/bed transfer   Chair/bed transfer method: Stand pivot Chair/bed transfer assist level: 2 helpers Chair/bed transfer assistive device: Bedrails, Environmental manager lift: Primary school teacher     Max distance: >150 ft Assist level: Touching or steadying assistance (Pt > 75%)   Wheelchair Wheelchair activity did not occur: Safety/medical concerns Type: Manual Max wheelchair distance: 150 ft Assist Level: Supervision or verbal cues  Cognition Comprehension Comprehension assist level: Understands basic 50 - 74% of the time/ requires cueing 25 - 49% of the time  Expression Expression assist level: Expresses basic 50 - 74% of the time/requires cueing 25 - 49% of the time. Needs to repeat parts of sentences.  Social Interaction Social Interaction assist level: Interacts appropriately 50 - 74% of the time - May be physically or verbally inappropriate.  Problem Solving Problem solving assist level: Solves basic 25 - 49% of the time - needs direction more than half the time to initiate, plan or complete simple activities  Memory Memory assist level: Recognizes or recalls 25 - 49% of the time/requires cueing  50 - 75% of the time   Medical Problem List and Plan: 1.  Deficits in mobility and self-care secondary to debility and TBI,.   -Continue PT, OT, ST.     -Ongoing cognitive deficits. More lucid sometimes than other   -SNF pending, wife does not feel like she can handle him at home  2.  DVT Prophylaxis/Anticoagulation: --on Xarelto.  No evidence of bleeding 3. Pain Management: continue ultram prn--- limit to avoid neuro sedating side effects 4. Mood: Ego support 5. Neuropsych: This patient is? Not  capable of making decisions on his own behalf.   -  Seroquel  8 PM nightly for sleep and sundowning    -PRN Seroquel available as needed for severe agitation as well.       6. Skin/Wound Care: pressure relief measures.  R   -Remove sutures from nose  7. Fluids/Electrolytes/Nutrition: Encourage p.o. intake.  I thoroughly reviewed patient's labs today.  Need to bruised nutrition and protein based on low albumin.   -Continue protein supplement      8. A fib with RVR: on amiodarone and metoprolol. Has resolved --started on lanoxin today and Cardizem titrated upwards to 60 mg tid. Monitor for orthostatic symptoms.  Vitals:   07/09/17 0325 07/09/17 0639  BP: (!) 126/37 (!) 134/53  Pulse: 74  Resp: (!) 21   Temp: 97.7 F (36.5 C)   SpO2: 96%   Heart rate controlled, blood pressure on low side hold metoprolol this morning 9.  Urinary retention/hematuria:    -Continue Avodart and Flomax     -Urine slowly cleaning, urology following   -hemoglobin lower at 9.9, patient is being hydrated in the evenings with IV fluid 10. Bilateral Knee OA: Right knee x-ray reviewed and shows mild arthritis as well as chondrocalcinosis.    11. Dizziness: Likely due to A flutter. . 12. Abnormal LFTs: Likely due to shocked liver and resolving hematoma.    -LFTs now essentially within normal limits 13.  Poor p.o. intake, getting IV supplementation, cognition likely interfering with adequate intake   -Begin Megace  trial   LOS (Days) 14 A FACE TO FACE EVALUATION WAS PERFORMED  Meredith Staggers, MD 07/09/2017 8:54 AM

## 2017-07-09 NOTE — NC FL2 (Signed)
[place] LEVEL OF CARE SCREENING TOOL     IDENTIFICATION  Patient Name: Jeremy Macdonald Birthdate: [birth year] Sex: male Admission Date (Current Location): 06/25/2017  [facility] and [place] Number:  Herbalist and Address:  [facility] [place], [place] [address] [place], [place] [address]      Provider Number: [id number]  Attending Physician Name and Address:  Meredith Staggers, MD  Relative Name and Phone Number:  Damante Spragg, spouse, [phone]    Current Level of Care: Other (Comment)(Acute Inpatient Rehab) Recommended Level of Care: [place] Prior Approval Number:    Date Approved/Denied:   PASRR Number: [id number]  Discharge Plan: SNF    Current Diagnoses: Patient Active Problem List   Diagnosis Date Noted  . Pain   . Debility 06/25/2017  . Chronic obstructive pulmonary disease ([place])   . Bilateral hearing loss   . Leukocytosis   . Gross hematuria   . Acute blood loss anemia   . Hypoalbuminemia   . Malnutrition of moderate degree 06/24/2017  . Dementia 06/21/2017  . Hyperglycemia 06/21/2017  . BPH (benign prostatic hyperplasia) 06/21/2017  . Dizziness 06/21/2017  . Atrial fibrillation with rapid ventricular response ([place]) 06/20/2017  . Atrial fibrillation ([place]) 06/20/2017  . Post PTCA 04/24/2017  . CAD (coronary artery disease), native coronary artery 04/22/2017  . Angina pectoris ([place]) 04/22/2017  . PAF (paroxysmal atrial fibrillation) ([place]) 03/18/2017  . Abnormal nuclear stress test 03/18/2017    Orientation RESPIRATION BLADDER Height & Weight     Self, Time, Situation, Place  Other (Comment)(scattered bruising on face and body) Indwelling catheter Weight: 72.3 kg (159 lb 6.3 oz) Height:  5\' 9"  (175.3 cm)  BEHAVIORAL SYMPTOMS/MOOD NEUROLOGICAL BOWEL NUTRITION STATUS      Continent Diet(2gram sodium)  AMBULATORY STATUS COMMUNICATION OF NEEDS Skin   Limited Assist Verbally                          Personal Care Assistance Level of Assistance  Bathing, Dressing, Feeding Bathing Assistance: Limited assistance Feeding assistance: (supervision) Dressing Assistance: Limited assistance     Functional Limitations Info  Hearing Sight Info: Impaired Hearing Info: Impaired      SPECIAL CARE FACTORS FREQUENCY  PT (By licensed PT), OT (By licensed OT), Speech therapy     PT Frequency: 5x/wk OT Frequency: 5x/wk     Speech Therapy Frequency: 5x/wk      Contractures Contractures Info: Not present    Additional Factors Info  Code Status, Allergies Code Status Info: Full Allergies Info: [email or web address]           Current Medications (07/09/2017):  This is the current hospital active medication list Current Facility-Administered Medications  Medication Dose Route Frequency Provider Last Rate Last Dose  . 0.45 % sodium chloride infusion   Intravenous Continuous Meredith Staggers, MD      . acetaminophen (TYLENOL) tablet 325-650 mg  325-650 mg Oral Q4H PRN Bary Leriche, PA-C   325 mg at 07/07/17 1645  . alum & mag hydroxide-simeth (MAALOX/MYLANTA) 200-200-20 MG/5ML suspension 30 mL  30 mL Oral Q4H PRN Love, Pamela S, PA-C      . amiodarone (PACERONE) tablet 200 mg  200 mg Oral Daily Adrian Prows, MD   200 mg at 07/09/17 0837  . atorvastatin (LIPITOR) tablet 10 mg  10 mg Oral Q2000 Bary Leriche, PA-C   10 mg at 07/08/17 2041  . bisacodyl (DULCOLAX)  suppository 10 mg  10 mg Rectal Daily PRN Love, Pamela S, PA-C      . digoxin (LANOXIN) tablet 0.125 mg  0.125 mg Oral Daily Bary Leriche, PA-C   0.125 mg at 07/09/17 2694  . diltiazem (CARDIZEM) tablet 60 mg  60 mg Oral Q8H LoveIvan Anchors, PA-C   60 mg at 07/09/17 8546  . diphenhydrAMINE (BENADRYL) 12.5 MG/5ML elixir 12.5-25 mg  12.5-25 mg Oral Q6H PRN Bary Leriche, PA-C   25 mg at 07/07/17 1645  . docusate sodium (COLACE) capsule 100 mg  100 mg Oral BID PRN Love, Pamela S, PA-C      . dutasteride (AVODART) capsule 0.5 mg   0.5 mg Oral Daily Love, Ivan Anchors, PA-C   0.5 mg at 07/09/17 2703   And  . tamsulosin (FLOMAX) capsule 0.4 mg  0.4 mg Oral Daily Bary Leriche, PA-C   0.4 mg at 07/09/17 5009  . feeding supplement (ENSURE ENLIVE) (ENSURE ENLIVE) liquid 237 mL  237 mL Oral TID WC & HS Bary Leriche, PA-C   237 mL at 07/09/17 0838  . guaiFENesin-dextromethorphan (ROBITUSSIN DM) 100-10 MG/5ML syrup 5-10 mL  5-10 mL Oral Q6H PRN Bary Leriche, PA-C   10 mL at 07/08/17 2300  . lidocaine (XYLOCAINE) 2 % jelly   Topical PRN Bary Leriche, PA-C   5 application at 38/18/29 0455  . megestrol (MEGACE) 400 MG/10ML suspension 400 mg  400 mg Oral BID Alger Simons T, MD      . metoprolol tartrate (LOPRESSOR) tablet 50 mg  50 mg Oral BID Reesa Chew S, PA-C   50 mg at 07/09/17 9371  . multivitamin with minerals tablet 1 tablet  1 tablet Oral Daily Bary Leriche, PA-C   1 tablet at 07/09/17 6967  . ondansetron (ZOFRAN) tablet 4 mg  4 mg Oral Q6H PRN Love, Pamela S, PA-C       Or  . ondansetron (ZOFRAN) injection 4 mg  4 mg Intravenous Q6H PRN Love, Pamela S, PA-C      . polyethylene glycol (MIRALAX / GLYCOLAX) packet 17 g  17 g Oral Daily PRN Love, Pamela S, PA-C      . polyethylene glycol (MIRALAX / GLYCOLAX) packet 17 g  17 g Oral Daily Bary Leriche, PA-C   17 g at 07/09/17 8938  . QUEtiapine (SEROQUEL) tablet 25 mg  25 mg Oral Q2000 Meredith Staggers, MD   25 mg at 07/08/17 2213  . QUEtiapine (SEROQUEL) tablet 25 mg  25 mg Oral Q12H PRN Meredith Staggers, MD   25 mg at 06/28/17 2344  . Rivaroxaban (XARELTO) tablet 15 mg  15 mg Oral Q supper Bary Leriche, PA-C   15 mg at 07/08/17 1811  . sodium phosphate (FLEET) 7-19 GM/118ML enema 1 enema  1 enema Rectal Once PRN Love, Pamela S, PA-C      . sorbitol 70 % solution 30 mL  30 mL Oral Daily PRN Meredith Staggers, MD   30 mL at 07/06/17 2141  . traMADol (ULTRAM) tablet 25 mg  25 mg Oral Q6H PRN Meredith Staggers, MD   25 mg at 07/07/17 0206     Discharge  Medications: Please see discharge summary for a list of discharge medications.  Relevant Imaging Results:  Relevant Lab Results:   Additional Information SSN: [id number]  Lennart Pall, LCSW

## 2017-07-09 NOTE — Plan of Care (Signed)
10/11 LTGs achieved 07/09/17

## 2017-07-09 NOTE — Discharge Summary (Signed)
Physician Discharge Summary  Patient ID: Jeremy Macdonald MRN: [id number] DOB/AGE: 1925/09/13 84 y.o.  Admit date: 06/25/2017 Discharge date: 07/10/2017  Discharge Diagnoses:  Principal Problem:   Debility Active Problems:   Atrial fibrillation (HCC)   BPH (benign prostatic hyperplasia)   Bilateral hearing loss   Gross hematuria   Acute blood loss anemia   Pain   Discharged Condition: stable   Significant Diagnostic Studies: Dg Chest 1 View  Result Date: 06/20/2017 CLINICAL DATA:  Pain following fall EXAM: CHEST 1 VIEW COMPARISON:  March 08, 2017 FINDINGS: There is scarring in the right lower lobe region. There is no edema or consolidation. Heart is upper normal in size with pulmonary vascularity within normal limits. There is aortic atherosclerosis. No adenopathy. No pneumothorax. No bone lesions. IMPRESSION: Scarring right lower lobe. No edema or consolidation. Stable cardiac silhouette. There is aortic atherosclerosis. Aortic Atherosclerosis (ICD10-I70.0). Electronically Signed   By: Lowella Grip III M.D.   On: 06/20/2017 11:32    Dg Knee 1-2 Views Right  Result Date: 06/25/2017 CLINICAL DATA:  Pain when standing to right knee. EXAM: RIGHT KNEE - 1-2 VIEW COMPARISON:  06/20/2017 FINDINGS: Chondrocalcinosis in medial and lateral compartments. Early marginal spur formation about all 3 compartments. Normal alignment. There is a small effusion in the suprapatellar bursa. Negative for fracture. Extensive popliteal arterial calcifications. IMPRESSION: 1. Negative for fracture or other acute bone abnormality. 2. Chondrocalcinosis with early tricompartmental degenerative change and effusion, suggesting CPPD arthropathy. Electronically Signed   By: Lucrezia Europe M.D.   On: 06/25/2017 19:03    US Renal  Result Date: 06/21/2017 CLINICAL DATA:  Oliguria. EXAM: RENAL / URINARY TRACT ULTRASOUND COMPLETE COMPARISON:  None. FINDINGS: Right Kidney: Length: 9.8 cm. Echogenicity is within  normal limits. No mass or hydronephrosis visualized. Left Kidney: Length: 10.9 cm. Echogenicity within normal limits. No hydronephrosis visualized. 2.5 cm simple cyst is seen in lower pole. Bladder: Decompressed secondary to Foley catheter. Severely enlarged prostate gland is again noted, measuring 6.8 x 6.2 x 4.9 cm. IMPRESSION: No hydronephrosis or renal obstruction is noted. Severely enlarged prostate gland is again noted as described on prior CT scan. Electronically Signed   By: Marijo Conception, M.D.   On: 06/21/2017 09:24   Dg Knee Complete 4 Views Left  Result Date: 06/20/2017 CLINICAL DATA:  Fall.  Anterior knee pain, left greater than right. EXAM: LEFT KNEE - COMPLETE 4+ VIEW; RIGHT KNEE - COMPLETE 4+ VIEW COMPARISON:  None. FINDINGS: Mild degenerative changes are most evident in the medial and femoral compartments bilaterally. A small right-sided joint effusion is present. Chondrocalcinosis is present in the menisci bilaterally. Vascular calcifications are present. The joints are located bilaterally. No acute osseous abnormalities are present. IMPRESSION: 1. Bilateral chondrocalcinosis compatible with CPPD arthropathy. 2. Mild osseous degenerative changes in the medial and patellofemoral compartments bilaterally. 3. No acute osseous abnormality. 4. Extensive vascular calcifications.  Atherosclerosis. Electronically Signed   By: San Morelle M.D.   On: 06/20/2017 11:34   Dg Knee Complete 4 Views Right  Result Date: 06/20/2017 CLINICAL DATA:  Fall.  Anterior knee pain, left greater than right. EXAM: LEFT KNEE - COMPLETE 4+ VIEW; RIGHT KNEE - COMPLETE 4+ VIEW COMPARISON:  None. FINDINGS: Mild degenerative changes are most evident in the medial and femoral compartments bilaterally. A small right-sided joint effusion is present. Chondrocalcinosis is present in the menisci bilaterally. Vascular calcifications are present. The joints are located bilaterally. No acute osseous abnormalities are  present. IMPRESSION: 1. Bilateral chondrocalcinosis  compatible with CPPD arthropathy. 2. Mild osseous degenerative changes in the medial and patellofemoral compartments bilaterally. 3. No acute osseous abnormality. 4. Extensive vascular calcifications.  Atherosclerosis. Electronically Signed   By: San Morelle M.D.   On: 06/20/2017 11:34   Dg Hand Complete Right  Result Date: 06/17/2017 CLINICAL DATA:  Posterior right hand laceration following a fall on concrete today. EXAM: RIGHT HAND - COMPLETE 3+ VIEW COMPARISON:  Right wrist dated 06/08/2017 and right hand dated 05/26/2013. FINDINGS: Degenerative changes at the articulation of the trapezium and trapezoid with the scaphoid. No fracture or dislocation seen. IMPRESSION: 1. No fracture. 2. Stable degenerative changes at the articulation of the trapezium and trapezoid with the scaphoid. Electronically Signed   By: Claudie Revering M.D.   On: 06/17/2017 18:24     Labs:  Basic Metabolic Panel: Recent Labs  Lab 07/05/17 0841 07/06/17 1020 07/08/17 0318 07/09/17 0822  NA 138 136 136 138  K 3.9 4.2 3.6 4.1  CL 100* 98* 97* 98*  CO2 26 26 29 29   GLUCOSE 152* 130* 116* 130*  BUN 12 17 18 12   CREATININE 1.03 1.02 0.84 0.84  CALCIUM 8.5* 8.5* 7.9* 8.3*    CBC: Recent Labs  Lab 07/07/17 0636 07/08/17 0318 07/09/17 0822  WBC 11.0* 8.5 8.0  HGB 10.5* 9.9* 11.7*  HCT 33.5* 31.5* 37.8*  MCV 96.5 96.0 96.7  PLT 236 231 268    CBG: No results for input(s): GLUCAP in the last 168 hours.  Brief HPI:   Jeremy Yardley Dickersonis a 84 y.o.malewith history of CAD, dementia, COPD, HOH, fall on 06/17/17 with nasal fracture and recurrent falls due to dizziness and weakness. He was admitted on 06/20/17 with weakness, inability of family to care for patient and was noted to have A fib with RVR, leucocytosis and dehydration. He was started IVF due to concerns of dehydration as well as IV amiodarone and IV cardizem. Foley placed due to urinary  retention and Dr. Alyson Ingles consulted due to hematuria from foley trauma. Patient with history of BPH with frequency therefore flomax increased to bid with recommendations to keep foley in place till mobile--to start CBI if hematuria worsens.CT head reviewed, unremarkable for acute intracranial process.Therapy was ongoing with patient showing decrease in dizziness and improvement in activity tolerance. CIR was recommended due to functional deficits   Hospital Course: Jeremy Macdonald was admitted to rehab 06/25/2017 for inpatient therapies to consist of PT, ST and OT at least three hours five days a week. Past admission physiatrist, therapy team and rehab RN have worked together to provide customized collaborative inpatient rehab. Blood pressures were monitored on bid basis and has been stable overall. Heart rate has been controlled on low dose BB. jHe continued to have issues with confusion, sleep wake disruption and sundowning. Low dose Seroquel was added with improvement in sleep hygiene. He continues to be confused and has been riented to self only during most of his stay.  X rays of right knee done due to reports of pain and mild edema noted on exam. X rays showed mild arthritis with chondrocalcinosis. This has been treated with local measures.  Foley was removed on as mobility was improving. He has urinary retention and had recurrent hematuria with attempts at catheterization. Dr. McDiarmid  Placed 14 french coude catheter on 01/30 but this stopped draining on 01/31 therefore this was changed out to 20 french with aid of cystoscopy and guide wire at bedside by Dr. Junious Silk. He was treated with  a dose of Keflex for prophylaxis. Foley is currently draining clear urine and hematuria has resolved. Foley is to remain in place till follow up with urology on outpatient basis.  He did develop hypotension on 02/1 and BP medications were adjusted and he was treated with IV fluids briefly. Megace was added to  help improve appetite due to poor po intake.  Follow up CBC 2/1  showed reactive leucocytosis and urine culture was ordered for work up. This was negative for infection and follow up CBC today shows that reactive leucocytosis has resolved. Serial Check of H/H showed H/H is slowly improving. His progress continues to vary due to physical as well as cognitive deficits with poor carryover. Wife is unable to provide care needed and had decided on SNF for progressive therapy. [facility] at [facility] on 07[facility]09/17 but family declined this and elected to take patient home with hired help. Family education has been completed regarding all aspects of care, safety as well as need for 24 hours physical assistance. He was discharged to home on 07/10/17   Rehab course: During patient's stay in rehab weekly team conferences were held to monitor patient's progress, set goals and discuss barriers to discharge. At admission, patient required mod assist with mobility and mod to max assist with ADL tasks. He exhibited moderate to severe cognitive impairments with language of confusion. He  has had improvement in activity tolerance, balance, postural control as well as ability to compensate for deficits.  He is able to bathe with min assist, needs supervision for UB dressing and min assist for LB dressing. He is able to perform transfers with min assist and cues for safety.  He is able to ambulate 100 + 150' with cues for safety and  steady assist with use of  RW. He is able to climb 4 steps with min assist. He requires max to total assist for problems solving and is able to attend to tasks for 15 minutes with minimal distractions.     Disposition: Home   Diet: Regular textures. Low salt.   Special Instructions: 1. Offer supplements four times a day to help with adequate intake.   2. Foley care bid. Follow up with urology has been set up for 2 weeks.   3. Family to administer medications.     Discharge Instructions     Ambulatory referral to Physical Medicine Rehab   Complete by:  As directed    Transitional care appt in 1-2 weeks.     Allergies as of 07/09/2017      Reactions   Phenergan [promethazine Hcl] Other (See Comments)   Confusion       Medication List    TAKE these medications   acetaminophen 325 MG tablet Commonly known as:  TYLENOL Take 1-2 tablets (325-650 mg total) by mouth every 4 (four) hours as needed for mild pain. What changed:    how much to take  when to take this  reasons to take this   amiodarone 200 MG tablet Commonly known as:  PACERONE Take 1 tablet (200 mg total) by mouth daily. Start taking on:  07/10/2017 What changed:  when to take this   atorvastatin 10 MG tablet Commonly known as:  LIPITOR Take 10 mg by mouth daily.   digoxin 0.125 MG tablet Commonly known as:  LANOXIN Take 1 tablet (0.125 mg total) by mouth daily.   diltiazem 60 MG tablet Commonly known as:  CARDIZEM Take 1 tablet (60 mg total) by mouth  every 8 (eight) hours.   docusate sodium 100 MG capsule Commonly known as:  COLACE Take 1 capsule (100 mg total) by mouth 2 (two) times daily as needed for mild constipation.   feeding supplement (ENSURE ENLIVE) Liqd Take 237 mLs by mouth 4 (four) times daily -  with meals and at bedtime. What changed:  when to take this   JALYN 0.5-0.4 MG Caps Generic drug:  Dutasteride-Tamsulosin HCl Take 1 tablet by mouth daily.   megestrol 400 MG/10ML suspension Commonly known as:  MEGACE Take 10 mLs (400 mg total) by mouth 2 (two) times daily.   metoprolol tartrate 50 MG tablet Commonly known as:  LOPRESSOR Take 1 tablet (50 mg total) by mouth 2 (two) times daily.   multivitamin with minerals Tabs tablet Take 1 tablet by mouth daily. Start taking on:  07/10/2017   polyethylene glycol packet Commonly known as:  MIRALAX / GLYCOLAX Take 17 g by mouth daily.   QUEtiapine 25 MG tablet Commonly known as:  SEROQUEL Take 1 tablet (25 mg total) by  mouth daily at 8 pm.   Rivaroxaban 15 MG Tabs tablet Commonly known as:  XARELTO Take 1 tablet (15 mg total) by mouth daily with supper. Start this evening         Contact information for follow-up providers    Adrian Prows, MD. Call in 1 day(s).   Specialty:  Cardiology Why:  for follow up appointment in one week.  Contact information: [address] [place] [address] [phone]        Meredith Staggers, MD Follow up.   Specialty:  Physical Medicine and Rehabilitation Why:  office will call with follow up appointment Contact information: [address] [place] [address] [phone]        Karen Kays, NP Follow up on 07/23/2017.   Specialty:  Nurse Practitioner Why:  be there at 10 am for 10:15am Contact information: [address] [place] [address] [phone]        Vernie Shanks, MD Follow up on 07/19/2017.   Specialty:  Family Medicine Why:  @ 3:00 pm (please arrive by 2:45 for hospital follow up appointment) Contact information: [address] [place] [address] [phone]            Contact information for after-discharge care    Destination    [facility] SN[facility] .   Service:  Skilled Nursing Contact information: [place] [phone]                  Signed: Bary Leriche 07/10/2017, 11:54 AM

## 2017-07-09 NOTE — Progress Notes (Signed)
Occupational Therapy Discharge Summary  Patient Details  Name: Jeremy Macdonald MRN: [id number] Date of Birth: 14-Jan-1926  Today's Date: 07/09/2017 OT Individual Time: 7588-3254 OT Individual Time Calculation (min): 73 min   Patient has met 10 of 11 long term goals due to improved activity tolerance, improved balance, postural control, ability to compensate for deficits, improved attention, improved awareness and improved coordination.  Patient to discharge at [facility] Assist level.  Patient has been accepted for SNF placement at [facility].  Reasons goals not met: SNF placement prior to OT goal achievement   Recommendation:  Patient will benefit from ongoing skilled OT services in skilled nursing facility setting to continue to advance functional skills in the area of BADL.  Equipment: No equipment provided  Reasons for discharge: discharge from hospital  Patient/family agrees with progress made and goals achieved: Yes   Short Term Goals: Week 2:  OT Short Term Goal 1 (Week 2): STGs=LTGs secondary to upcoming discharge  Skilled Therapeutic Interventions/Progress Updates:    Pt greeted in w/c with spouse present. Requesting shower. IV covered. Stand pivot<TTB completed with Min A and cues for hand placement. Once seated, pt requiring min vcs for sequencing and self organization. Sit<stand for pericare completion with Min A and use of grab bars. Once transferred back to w/c, he proceeded to dress at sink. Pt required mod cues for attention, as he would complete the first few steps of an ADL task and forget to finish completely. Pt standing at sink to complete oral care with manual cuing for improving upright posture. He then transferred back to w/c. Recorded OT events in memory journal. Pt left in w/c with safety belt fastened, chair alarm set, and all needs within reach.   Pt oriented x4 with min vcs this AM.  OT Discharge Precautions/Restrictions   Precautions Precautions: Fall Precaution Comments: monitor HR Restrictions Weight Bearing Restrictions: No Vital Signs Therapy Vitals Temp: 97.8 F (36.6 C) Temp Source: Oral Pulse Rate: 68 Resp: 18 BP: 124/62 Patient Position (if appropriate): Sitting Oxygen Therapy SpO2: 95 % O2 Device: Not Delivered ADL ADL ADL Comments: Please see functional navigator for ADL status Vision Baseline Vision/History: Wears glasses Vision Assessment?: No apparent visual deficits Perception  Perception: Within Functional Limits Praxis Praxis: Impaired Praxis Impairment Details: Ideation Cognition Overall Cognitive Status: Impaired/Different from baseline Arousal/Alertness: Awake/alert Orientation Level: Oriented to person;Oriented to place;Oriented to situation Attention: Selective Focused Attention: Appears intact Sustained Attention: Impaired Selective Attention: Impaired Memory: Impaired Awareness: Impaired Problem Solving: Impaired Safety/Judgment: Impaired Sensation Sensation Light Touch: Appears Intact Hot/Cold: Appears Intact Proprioception: Appears Intact Coordination Gross Motor Movements are Fluid and Coordinated: Yes Fine Motor Movements are Fluid and Coordinated: No(requires assist for fastening small buttons on pants due to [facility] deficits) Coordination and Movement Description: Impacted by pain, fatigue, and cognition  Motor  Motor Motor: Abnormal postural alignment and control Motor - Discharge Observations: Impacted by pain, fatigue, and cognition  Mobility  Transfers Sit to Stand: 4: Min assist;Other/comment(from TTB) Sit to Stand Details: (cuing for hand placement on armrests to push up) Stand to Sit: 4: Min assist;Other (comment)(to TTB)  Trunk/Postural Assessment  Cervical Assessment Cervical Assessment: Exceptions to WFL(forward head) Thoracic Assessment Thoracic Assessment: Exceptions to WFL(kyphotic) Lumbar Assessment Lumbar Assessment: Exceptions  to WFL(posterior pelvic tilt)  Balance Balance Balance Assessed: Yes Dynamic Sitting Balance Dynamic Sitting - Level of Assistance: 5: Stand by assistance Dynamic Sitting - Balance Activities: Lateral lean/weight shifting;Forward lean/weight shifting;Reaching for objects Sitting balance - Comments:  During LB bathing + dressing Dynamic Standing Balance Dynamic Standing - Balance Support: Left upper extremity supported Dynamic Standing - Level of Assistance: 4: Min assist Dynamic Standing - Balance Activities: Lateral lean/weight shifting;Forward lean/weight shifting Dynamic Standing - Comments: Perihygiene completion in shower  Extremity/Trunk Assessment RUE Assessment RUE Assessment: Within Functional Limits LUE Assessment LUE Assessment: Within Functional Limits   See Function Navigator for Current Functional Status.   A  07/09/2017, 4:38 PM

## 2017-07-09 NOTE — Progress Notes (Signed)
Speech Language Pathology Daily Session Note  Patient Details  Name: Jeremy Macdonald MRN: [id number] Date of Birth: [birth year]  Today's Date: 07/09/2017 SLP Individual Time: 0730-0800 SLP Individual Time Calculation (min): 30 min  Short Term Goals: Week 2: SLP Short Term Goal 1 (Week 2): Patient will identify 2 cognitive and 2 physical changes since admission with Max A multimodal cues.  SLP Short Term Goal 2 (Week 2): Patient will demonstrate functional problem solving for basic and familiar tasks with Min A verbal cues.  SLP Short Term Goal 3 (Week 2): Patient will demonstrate sustained attention to functional tasks for ~20 minutes with Min A verbal cues for redirection.  SLP Short Term Goal 4 (Week 2): Patient will demonstrate orientation to time and place with Min A multimodal cues.   Skilled Therapeutic Interventions: Skilled treatment session focused on cognitive goals. Upon arrival, patient was awake while supine in bed. Patient sat EOB with Mod I. Patient performed tray set-up with Mod I and alternated attention between self-feeding and functional conversation for ~15 minutes with Mod I. SLP also facilitated session by providing Max A for problem solving in regards to reading/utilizing daily schedule and calendar to anticipate upcoming sessions and orientation. Patient left upright in wheelchair in the bathroom with NT for transfer to the commode. Continue with current plan of care.    Function:  Cognition Comprehension Comprehension assist level: Understands basic 50 - 74% of the time/ requires cueing 25 - 49% of the time  Expression   Expression assist level: Expresses basic 50 - 74% of the time/requires cueing 25 - 49% of the time. Needs to repeat parts of sentences.  Social Interaction Social Interaction assist level: Interacts appropriately 75 - 89% of the time - Needs redirection for appropriate language or to initiate interaction.  Problem Solving Problem solving assist  level: Solves basic 25 - 49% of the time - needs direction more than half the time to initiate, plan or complete simple activities  Memory Memory assist level: Recognizes or recalls 25 - 49% of the time/requires cueing 50 - 75% of the time    Pain No/denies pain  Therapy/Group: Individual Therapy  Meredeth Ide  SLP - Student 07/09/2017, 3:15 PM

## 2017-07-09 NOTE — Progress Notes (Signed)
Report was call to [place] at [facility] skill facility.IV was removed.Pt. Is ready to be transfer via ambulance.

## 2017-07-09 NOTE — Progress Notes (Signed)
Social Work Patient ID: Jeremy Macdonald, male   DOB: 12/30/1925, 84 y.o.   MRN: [id number]   Have received SNF bed offer today from [facility] with request to admit this afternoon.  Have discussed with pt and wife and they are accepting this offer (private room).  MD and PA note pt medically clear for d/c.  Alerting RN and tx team.  Plan to transfer this afternoon via ambulance.  Calder Oblinger, LCSW

## 2017-07-09 NOTE — Progress Notes (Signed)
Physical Therapy Discharge Summary  Patient Details  Name: Jeremy Macdonald MRN: [id number] Date of Birth: [birth year]  Today's Date: 07/09/2017   Patient has met 8 of 10 long term goals due to improved activity tolerance, improved balance, increased strength, improved awareness and improved coordination.  Patient to discharge at an ambulatory level [place] with RW.  Patient's care partner is unable to provide the necessary physical and cognitive assistance at discharge.  Reasons goals not met: impaired cognition, intermittent pain & weakness  Recommendation:  Patient will benefit from ongoing skilled PT services in skilled nursing facility setting to continue to advance safe functional mobility, address ongoing impairments in balance, transfers, gait, stair negotiation, endurance, strength, impaired cognition & awareness, and minimize fall risk.  Equipment: No equipment provided  Reasons for discharge: discharge from hospital  Patient/family agrees with progress made and goals achieved: Yes  PT Discharge Precautions/Restrictions Precautions Precautions: Fall Precaution Comments: monitor HR Restrictions Weight Bearing Restrictions: No  Vision/Perception  Pt wears glasses at baseline.  Cognition Overall Cognitive Status: Impaired/Different from baseline Arousal/Alertness: Awake/alert Orientation Level: Oriented to person Memory: Impaired Memory Impairment: Decreased recall of new information;Decreased short term memory Awareness: Impaired Problem Solving: Impaired Problem Solving Impairment: Functional basic;Verbal basic Safety/Judgment: Impaired  Motor  Motor Motor - Discharge Observations: BLE weakness   Mobility Transfers Transfers: Yes Sit to Stand: 4: Min assist(can range from min<>max assist depending on fatigue & BLE weakness) Sit to Stand Details: (cuing for hand placement on armrests to push up) Stand to Sit: 4: Min assist(max verbal cuing for  technique for increased safety)  Locomotion  Ambulation Ambulation: Yes Ambulation/Gait Assistance: 4: Min guard Ambulation Distance (Feet): 150 Feet Assistive device: Rolling walker(decreased awareness & will push RW far out in front of him) Gait Gait: No Gait Pattern: Decreased step length - right;Decreased step length - left(decreased gait speed) Stairs / Additional Locomotion Stairs: Yes Stairs Assistance: 4: Min assist(BLE knee buckling) Stair Management Technique: Two rails Number of Stairs: 4 Height of Stairs: 6(inches) Ramp: 4: Min assist(with RW, cuing to safely maneuver RW over edge of ramp) Wheelchair Mobility Wheelchair Mobility: No   Trunk/Postural Assessment  Thoracic Assessment Thoracic Assessment: (kyphosis)   Balance Balance Balance Assessed: Yes Dynamic Standing Balance Dynamic Standing - Balance Support: Bilateral upper extremity supported Dynamic Standing - Level of Assistance: 4: Min assist Dynamic Standing - Balance Activities: (during gait)  Extremity Assessment  RLE Assessment RLE Assessment: (ROM WFL) LLE Assessment LLE Assessment: (ROM WFL)   See Function Navigator for Current Functional Status.  [name] 07/09/2017, 4:48 PM

## 2017-07-09 NOTE — Progress Notes (Signed)
Physical Therapy Session Note  Patient Details  Name: Jeremy Macdonald MRN: [id number] Date of Birth: 01-Oct-1925  Today's Date: 07/09/2017 PT Individual Time: 1305-1400 PT Individual Time Calculation (min): 55 min   Short Term Goals: Week 2:  PT Short Term Goal 1 (Week 2): Pt will initiate stair negotiation for strengthening purposes. PT Short Term Goal 2 (Week 2): Pt will consistently complete sit<>stand transfers with min assist.  Skilled Therapeutic Interventions/Progress Updates:  Pt received in w/c & agreeable to tx, denying c/o pain. Reviewed memory journal with pt - pt with slightly improved ability to recall events from this morning but with poor use of memory journal. Pt also requires max cuing to recall correct date even when using calendar. Transported pt to gym via w/c total assist for time management. Pt requires min assist for sit>stand transfers and occasional cuing for proper hand placement to push up on armrest. Gait x 100 ft + 150 ft + 150 ft with steady assist for strengthening & activity tolerance; pt pushes walker out in front of him. Pt completes car transfer at low sedan simulated height with steady assist; pt uses UE to transfer LE in/out of car 2/2 BLE weakness. Pt ambulated over ramp & mulch with RW and min assist - pt with decreased safety awareness and requires cuing for RW placement. Pt negotiated 4 steps (6") with B rails and min assist; pt with a couple episodes of B knee buckling 2/2 weakness. Therapist completed entry in memory journal and pt required max<>total cuing to recall activities from past hour. At end of session pt left sitting in w/c in room with QRB & chair alarm donned, telesitter in room, and all needs within reach.  Pt remains confused as he reported the "police" were coming to get him from the hospital a couple weeks ago & pt unable to recall correct age (he reported he was 84 y/o but therapist oriented him to correct age of 84 y/o).   Therapy  Documentation Precautions:  Precautions Precautions: Fall Precaution Comments: monitor HR Restrictions Weight Bearing Restrictions: No   See Function Navigator for Current Functional Status.   Therapy/Group: Individual Therapy  [name] 07/09/2017, 2:00 PM

## 2017-07-09 NOTE — Progress Notes (Signed)
Speech Language Pathology Discharge Summary  Patient Details  Name: TEOMAN GIRAUD MRN: [id number] Date of Birth: [birth year]    Patient has met 4 of 4 long term goals.  Patient to discharge at [facility] Max;Mod level.   Reasons goals not met: N/A   Clinical Impression/Discharge Summary: Patient has made functional gains and has met 4 of 4 LTG's this admission. Currently, patient requires overall Max A verbal cues for use of external aids to recall functional information, Mod A verbal cues for functional problem solving and awareness and Min A-Supervision verbal cues for attention. However, both the patient's cognitive and physical functioning fluctuates throughout the day. Patient's family is unable to provide the necessary assistance needed at this time, therefore, patient will discharge to a SNF. Patient would benefit from f/u SLP services to maximize his cognitive function and overall functional independence to reduce caregiver burden.   Care Partner:  Caregiver Able to Provide Assistance: No  Type of Caregiver Assistance: Physical;Cognitive  Recommendation:  Skilled Nursing facility;24 hour supervision/assistance  Rationale for SLP Follow Up: Maximize cognitive function and independence;Reduce caregiver burden   Equipment: N/A   Reasons for discharge: Treatment goals met;Discharged from hospital   Patient/Family Agrees with Progress Made and Goals Achieved: Yes    Weston Anna, [place], [place]   [place], [place] 07/09/2017, 3:17 PM

## 2017-07-10 ENCOUNTER — Inpatient Hospital Stay (HOSPITAL_COMMUNITY): Payer: Medicare Other | Admitting: Speech Pathology

## 2017-07-10 ENCOUNTER — Inpatient Hospital Stay (HOSPITAL_COMMUNITY): Payer: Medicare Other | Admitting: Physical Therapy

## 2017-07-10 ENCOUNTER — Inpatient Hospital Stay (HOSPITAL_COMMUNITY): Payer: Medicare Other | Admitting: Occupational Therapy

## 2017-07-10 LAB — BASIC METABOLIC PANEL
Anion gap: 10 (ref 5–15)
BUN: 13 mg/dL (ref 6–20)
CALCIUM: 8.3 mg/dL — AB (ref 8.9–10.3)
CO2: 29 mmol/L (ref 22–32)
CREATININE: 0.81 mg/dL (ref 0.61–1.24)
Chloride: 100 mmol/L — ABNORMAL LOW (ref 101–111)
GFR calc Af Amer: >60 mL/min (ref >60)
GFR calc non Af Amer: >60 mL/min (ref >60)
GLUCOSE: 122 mg/dL — AB (ref 65–99)
Potassium: 4.1 mmol/L (ref 3.5–5.1)
Sodium: 139 mmol/L (ref 135–145)

## 2017-07-10 LAB — CBC
HCT: 34 % — ABNORMAL LOW (ref 39.0–52.0)
Hemoglobin: 10.3 g/dL — ABNORMAL LOW (ref 13.0–17.0)
MCH: 29.1 pg (ref 26.0–34.0)
MCHC: 30.3 g/dL (ref 30.0–36.0)
MCV: 96 fL (ref 78.0–100.0)
PLATELETS: 233 10*3/uL (ref 150–400)
RBC: 3.54 MIL/uL — ABNORMAL LOW (ref 4.22–5.81)
RDW: 14.5 % (ref 11.5–15.5)
WBC: 9 10*3/uL (ref 4.0–10.5)

## 2017-07-10 MED ORDER — DIGOXIN 125 MCG PO TABS: 0.1250 mg | ORAL_TABLET | Freq: Every day | ORAL | 0 refills | Status: AC

## 2017-07-10 MED ORDER — DOCUSATE SODIUM 100 MG PO CAPS: 100.0000 mg | ORAL_CAPSULE | Freq: Two times a day (BID) | ORAL | 0 refills | Status: DC | PRN

## 2017-07-10 MED ORDER — AMIODARONE HCL 200 MG PO TABS: 200.0000 mg | ORAL_TABLET | Freq: Every day | ORAL | 0 refills | Status: AC

## 2017-07-10 MED ORDER — MEGESTROL ACETATE 400 MG/10ML PO SUSP: 400.0000 mg | Freq: Two times a day (BID) | ORAL | 0 refills | Status: DC

## 2017-07-10 MED ORDER — ATORVASTATIN CALCIUM 10 MG PO TABS: 10.0000 mg | ORAL_TABLET | Freq: Every day | ORAL | 0 refills | Status: DC

## 2017-07-10 MED ORDER — RIVAROXABAN 15 MG PO TABS: 15.0000 mg | ORAL_TABLET | Freq: Every day | ORAL | 0 refills | Status: DC

## 2017-07-10 MED ORDER — DILTIAZEM HCL 60 MG PO TABS: 60.0000 mg | ORAL_TABLET | Freq: Three times a day (TID) | ORAL | 0 refills | Status: AC

## 2017-07-10 MED ORDER — DUTASTERIDE-TAMSULOSIN HCL 0.5-0.4 MG PO CAPS: 1.0000 | ORAL_CAPSULE | Freq: Every day | ORAL | 0 refills | Status: AC

## 2017-07-10 MED ORDER — QUETIAPINE FUMARATE 25 MG PO TABS: 25.0000 mg | ORAL_TABLET | Freq: Every day | ORAL | 0 refills | Status: AC

## 2017-07-10 NOTE — Progress Notes (Signed)
Social Work Patient ID: Jeremy Macdonald, male   DOB: 1926/06/02, 84 y.o.   MRN: [id number]  Spoke with [facility] late afternoon yesterday and they were reporting that wife not happy with the facility itself and the fact that they will not use a quick release belt for safety.  Explained that we have been using QRB for safety reminder only and that we have not had any behavioral concerns for pt here.  Wife did not want to place pt at [facility] after seeing the facility and son in agreement stating that the room and surroundings "were awful".  Wife and son report they would prefer to bring patient home and will hire private duty support to assist wife who can only provide supervision.  As pt had not yet been picked up by ambulance at time this decision was made, MD contacted and approved cancelling discharge from CIR with plan that family will take pt HOME today.  Family and staff aware.  Working with family today to make all necessary referrals and order DME.  Son, Jeremy Macdonald, is coordinating private duty and also plans to stay with pt and his mother initially at d/c.  Zarea Diesing, LCSW

## 2017-07-10 NOTE — Consult Note (Signed)
   [facility] CM Inpatient Consult   07/10/2017  DOMNIC VANTOL [birth year] [id number]    Received call from inpatient rehab LCSW to discuss [place] [facility] referral. However, after further discussion, it appears that Mr. Bon could benefit from the [place] with Tommi Rumps with [facility] to discuss referral.   Martin Majestic to bedside to speak with patient, son [name], and wife [name] about potential [place] Management services while on the [place] program.  Discussed that if pharmacy or transportation needs are identified while on the [place], [place] [facility] will assist. Both patient, son and wife are agreeable to this. Written [facility] Management consent obtained.   Also discussed that if community case management needs are identified once off the [place] program, [place] [facility] will follow upon referral.   Mr. Porte and family remain agreeable.   Will send notification to [place] [facility] team.    Marthenia Rolling, MSN-Ed, RN,BSN [facility] Liaison [phone]

## 2017-07-10 NOTE — Progress Notes (Signed)
Patient and family discussed the discharged instructions with Reesa Chew PA before they left the hospital.

## 2017-07-11 ENCOUNTER — Other Ambulatory Visit: Payer: Self-pay | Admitting: *Deleted

## 2017-07-11 ENCOUNTER — Telehealth: Payer: Self-pay | Admitting: Registered Nurse

## 2017-07-11 DIAGNOSIS — N401 Enlarged prostate with lower urinary tract symptoms: Secondary | ICD-10-CM | POA: Diagnosis not present

## 2017-07-11 DIAGNOSIS — R339 Retention of urine, unspecified: Secondary | ICD-10-CM | POA: Diagnosis not present

## 2017-07-11 DIAGNOSIS — J441 Chronic obstructive pulmonary disease with (acute) exacerbation: Secondary | ICD-10-CM

## 2017-07-11 NOTE — Patient Outreach (Addendum)
Received notification from [place] with [place] that patient declined the aide services associated with [place] program after he got home. States he was agreeable to home health however.  Will make referral to [place] since referral now since patient has declined [place] request was for [facility] by inpatient rehab LCSW.   Mr. Honor has a history of COPD, CAD, HTN, HLD, Afib.   Lives with his wife and has a supportive son.  Marthenia Rolling, MSN-Ed, RN,BSN [facility] Liaison [phone]

## 2017-07-11 NOTE — Telephone Encounter (Signed)
Transitional Care call Transitional Care Call Completed, Appointment Confirmed, Address Confirmed, New Patient Packet Mailed Transitional Care Call Questions Answered by Mrs. Dulworth  Patient name: Jeremy Macdonald DOB: 1926-04-20 1. Are you/is patient experiencing any problems since coming home? No a. Are there any questions regarding any aspect of care? No 2. Are there any questions regarding medications administration/dosing? Yes a. Are meds being taken as prescribed? Mrs. Mihalik reports she doesn't have the Amiodarone, call placed to [place]. Spoke with pharmacist, amiodarone will be available 07/12/2017. Mrs. Polanco made aware of the above. b. "Patient should review meds with caller to confirm" Medication List Reviewed 3. Have there been any falls? No 4. Has Home Health been to the house and/or have they contacted you? Yes, [facility] a. If not, have you tried to contact them? NA b. Can we help you contact them? Na 5. Are bowels and bladder emptying properly? Yes a. Are there any unexpected incontinence issues? No b. If applicable, is patient following bowel/bladder programs? (NA 6. Any fevers, problems with breathing, unexpected pain? No 7. Are there any skin problems or new areas of breakdown? No 8. Has the patient/family member arranged specialty MD follow up (ie cardiology/neurology/renal/surgical/etc.)?  Yes, Mrs. Lauderback call Dr. Einar Gip office today.  a. Can we help arrange? NA 9. Does the patient need any other services or support that we can help arrange? No 10. Are caregivers following through as expected in assisting the patient? Yes 11. Has the patient quit smoking, drinking alcohol, or using drugs as recommended? Denies smoking, drinking alcohol or use of illicit drugs.  Appointment date/time 07/18/2017, arrival time 12:45 for 1:00 appointment with Danella Sensing ANP-C at [facility]

## 2017-07-12 ENCOUNTER — Other Ambulatory Visit: Payer: Self-pay

## 2017-07-12 NOTE — Patient Outreach (Addendum)
[facility]) Care Management  07/12/2017  VERLYN LAMBERT 03/13/1926 [id number]  Subjective: "I think we've got everything"  Objective: none  Assessment: referral received from [facility] 84 year old with recent rehabilitation admission 1/21-2/5 due to [place]. History of atrial fibrillation, bilateral hearing loss, dementia, COPD, fall 06/17/17, hematuria.   RNCM spoke with Mrs Pinson who reports [facility] home health services have been in contact with her. She states she does not need anyone to clean or help client with his bath. She states she is assisting client. He is taking sponge baths and they are in the process of getting a seat for the toilet. Mrs. Senn states she has a supportive son.   Mrs. Bos reports she has all the medications except one and she will be picking them up from the pharmacy today.   RNCM discussed the care management program and Mrs. Delacruz is declining care management services at this time.   RNCM encouraged Mrs. Vanallen to call if needs change. Also reinforced the 24 hour nurse advice line and encouraged Mrs. Mayall to call as needed.  Plan: close case.  Thea Silversmith, RN, MSN, [place] Coordinator Cell: [phone]

## 2017-07-14 ENCOUNTER — Encounter (HOSPITAL_COMMUNITY): Payer: Self-pay

## 2017-07-14 ENCOUNTER — Other Ambulatory Visit: Payer: Self-pay

## 2017-07-14 ENCOUNTER — Inpatient Hospital Stay (HOSPITAL_COMMUNITY)
Admission: EM | Admit: 2017-07-14 | Discharge: 2017-07-18 | DRG: 698 | Disposition: A | Discharge status: Skilled Nursing Facility | Payer: Medicare Other | Attending: Internal Medicine | Admitting: Internal Medicine

## 2017-07-14 ENCOUNTER — Encounter (HOSPITAL_COMMUNITY): Payer: Self-pay | Admitting: Emergency Medicine

## 2017-07-14 ENCOUNTER — Emergency Department (HOSPITAL_COMMUNITY)
Admission: EM | Admit: 2017-07-14 | Discharge: 2017-07-14 | Discharge status: Against Medical Advice | Payer: Medicare Other | Source: Home / Self Care

## 2017-07-14 DIAGNOSIS — J440 Chronic obstructive pulmonary disease with acute lower respiratory infection: Secondary | ICD-10-CM | POA: Diagnosis not present

## 2017-07-14 DIAGNOSIS — Z85828 Personal history of other malignant neoplasm of skin: Secondary | ICD-10-CM

## 2017-07-14 DIAGNOSIS — I480 Paroxysmal atrial fibrillation: Secondary | ICD-10-CM | POA: Diagnosis present

## 2017-07-14 DIAGNOSIS — I2510 Atherosclerotic heart disease of native coronary artery without angina pectoris: Secondary | ICD-10-CM | POA: Diagnosis present

## 2017-07-14 DIAGNOSIS — Z5321 Procedure and treatment not carried out due to patient leaving prior to being seen by health care provider: Secondary | ICD-10-CM

## 2017-07-14 DIAGNOSIS — R339 Retention of urine, unspecified: Secondary | ICD-10-CM

## 2017-07-14 DIAGNOSIS — N3289 Other specified disorders of bladder: Secondary | ICD-10-CM | POA: Diagnosis not present

## 2017-07-14 DIAGNOSIS — R319 Hematuria, unspecified: Secondary | ICD-10-CM | POA: Diagnosis present

## 2017-07-14 DIAGNOSIS — J449 Chronic obstructive pulmonary disease, unspecified: Secondary | ICD-10-CM | POA: Diagnosis present

## 2017-07-14 DIAGNOSIS — Z66 Do not resuscitate: Secondary | ICD-10-CM | POA: Diagnosis not present

## 2017-07-14 DIAGNOSIS — S3739XA Other injury of urethra, initial encounter: Principal | ICD-10-CM | POA: Diagnosis present

## 2017-07-14 DIAGNOSIS — J181 Lobar pneumonia, unspecified organism: Secondary | ICD-10-CM | POA: Diagnosis not present

## 2017-07-14 DIAGNOSIS — I513 Intracardiac thrombosis, not elsewhere classified: Secondary | ICD-10-CM | POA: Diagnosis present

## 2017-07-14 DIAGNOSIS — D62 Acute posthemorrhagic anemia: Secondary | ICD-10-CM | POA: Diagnosis not present

## 2017-07-14 DIAGNOSIS — Z936 Other artificial openings of urinary tract status: Secondary | ICD-10-CM

## 2017-07-14 DIAGNOSIS — E7800 Pure hypercholesterolemia, unspecified: Secondary | ICD-10-CM | POA: Diagnosis present

## 2017-07-14 DIAGNOSIS — R310 Gross hematuria: Secondary | ICD-10-CM | POA: Diagnosis not present

## 2017-07-14 DIAGNOSIS — Z466 Encounter for fitting and adjustment of urinary device: Secondary | ICD-10-CM | POA: Diagnosis not present

## 2017-07-14 DIAGNOSIS — N401 Enlarged prostate with lower urinary tract symptoms: Secondary | ICD-10-CM | POA: Diagnosis present

## 2017-07-14 DIAGNOSIS — Z955 Presence of coronary angioplasty implant and graft: Secondary | ICD-10-CM

## 2017-07-14 DIAGNOSIS — F0390 Unspecified dementia without behavioral disturbance: Secondary | ICD-10-CM | POA: Diagnosis present

## 2017-07-14 DIAGNOSIS — R1030 Lower abdominal pain, unspecified: Secondary | ICD-10-CM | POA: Diagnosis not present

## 2017-07-14 DIAGNOSIS — K5900 Constipation, unspecified: Secondary | ICD-10-CM | POA: Diagnosis present

## 2017-07-14 DIAGNOSIS — Z9181 History of falling: Secondary | ICD-10-CM

## 2017-07-14 DIAGNOSIS — I4891 Unspecified atrial fibrillation: Secondary | ICD-10-CM | POA: Diagnosis not present

## 2017-07-14 DIAGNOSIS — X58XXXA Exposure to other specified factors, initial encounter: Secondary | ICD-10-CM | POA: Diagnosis present

## 2017-07-14 DIAGNOSIS — R140 Abdominal distension (gaseous): Secondary | ICD-10-CM

## 2017-07-14 DIAGNOSIS — Z888 Allergy status to other drugs, medicaments and biological substances status: Secondary | ICD-10-CM

## 2017-07-14 DIAGNOSIS — I10 Essential (primary) hypertension: Secondary | ICD-10-CM

## 2017-07-14 DIAGNOSIS — R338 Other retention of urine: Secondary | ICD-10-CM | POA: Diagnosis not present

## 2017-07-14 DIAGNOSIS — F17210 Nicotine dependence, cigarettes, uncomplicated: Secondary | ICD-10-CM | POA: Diagnosis present

## 2017-07-14 DIAGNOSIS — I714 Abdominal aortic aneurysm, without rupture: Secondary | ICD-10-CM | POA: Diagnosis present

## 2017-07-14 DIAGNOSIS — Z79899 Other long term (current) drug therapy: Secondary | ICD-10-CM

## 2017-07-14 DIAGNOSIS — Z7901 Long term (current) use of anticoagulants: Secondary | ICD-10-CM

## 2017-07-14 DIAGNOSIS — Z8701 Personal history of pneumonia (recurrent): Secondary | ICD-10-CM

## 2017-07-14 LAB — URINALYSIS, ROUTINE W REFLEX MICROSCOPIC

## 2017-07-14 LAB — CBC WITH DIFFERENTIAL/PLATELET
BASOS PCT: 0 %
Basophils Absolute: 0 10*3/uL (ref 0.0–0.1)
Eosinophils Absolute: 0 10*3/uL (ref 0.0–0.7)
Eosinophils Relative: 0 %
HEMATOCRIT: 33 % — AB (ref 39.0–52.0)
Hemoglobin: 10.6 g/dL — ABNORMAL LOW (ref 13.0–17.0)
LYMPHS ABS: 1.5 10*3/uL (ref 0.7–4.0)
Lymphocytes Relative: 11 %
MCH: 30.1 pg (ref 26.0–34.0)
MCHC: 32.1 g/dL (ref 30.0–36.0)
MCV: 93.8 fL (ref 78.0–100.0)
MONO ABS: 1.3 10*3/uL — AB (ref 0.1–1.0)
MONOS PCT: 10 %
NEUTROS ABS: 10.5 10*3/uL — AB (ref 1.7–7.7)
Neutrophils Relative %: 79 %
Platelets: 188 10*3/uL (ref 150–400)
RBC: 3.52 MIL/uL — ABNORMAL LOW (ref 4.22–5.81)
RDW: 15.3 % (ref 11.5–15.5)
WBC: 13.2 10*3/uL — ABNORMAL HIGH (ref 4.0–10.5)

## 2017-07-14 LAB — COMPREHENSIVE METABOLIC PANEL
ALT: 26 U/L (ref 17–63)
ANION GAP: 7 (ref 5–15)
AST: 24 U/L (ref 15–41)
Albumin: 2.3 g/dL — ABNORMAL LOW (ref 3.5–5.0)
Alkaline Phosphatase: 81 U/L (ref 38–126)
BILIRUBIN TOTAL: 0.7 mg/dL (ref 0.3–1.2)
BUN: 18 mg/dL (ref 6–20)
CALCIUM: 8.2 mg/dL — AB (ref 8.9–10.3)
CO2: 27 mmol/L (ref 22–32)
Chloride: 104 mmol/L (ref 101–111)
Creatinine, Ser: 0.89 mg/dL (ref 0.61–1.24)
GLUCOSE: 133 mg/dL — AB (ref 65–99)
POTASSIUM: 4.1 mmol/L (ref 3.5–5.1)
Sodium: 138 mmol/L (ref 135–145)
TOTAL PROTEIN: 6.4 g/dL — AB (ref 6.5–8.1)

## 2017-07-14 LAB — I-STAT TROPONIN, ED: TROPONIN I, POC: 0.02 ng/mL (ref 0.00–0.08)

## 2017-07-14 LAB — URINALYSIS, MICROSCOPIC (REFLEX): Bacteria, UA: NONE SEEN

## 2017-07-14 MED ORDER — SODIUM CHLORIDE 0.9 % IV BOLUS (SEPSIS)
1000.0000 mL | Freq: Once | INTRAVENOUS | Status: AC
  Administered 2017-07-14: 1000 mL via INTRAVENOUS

## 2017-07-14 MED ORDER — DEXTROSE 5 % IV SOLN
1.0000 g | Freq: Once | INTRAVENOUS | Status: AC
  Administered 2017-07-14: 1 g via INTRAVENOUS
  Filled 2017-07-14: qty 10

## 2017-07-14 MED ORDER — LIDOCAINE HCL 2 % EX GEL
1.0000 "application " | Freq: Once | CUTANEOUS | Status: AC
  Administered 2017-07-14: 1 via URETHRAL
  Filled 2017-07-14: qty 5

## 2017-07-14 MED ORDER — MORPHINE SULFATE (PF) 4 MG/ML IV SOLN
4.0000 mg | Freq: Once | INTRAVENOUS | Status: AC
  Administered 2017-07-14: 4 mg via INTRAVENOUS
  Filled 2017-07-14: qty 1

## 2017-07-14 NOTE — H&P (Deleted)
PCP:   Vernie Shanks, MD   Chief Complaint:  n  HPI: n  Review of Systems:  The patient denies anorexia, fever, weight loss,, vision loss, decreased hearing, hoarseness, chest pain, syncope, dyspnea on exertion, peripheral edema, balance deficits, hemoptysis, abdominal pain, melena, hematochezia, severe indigestion/heartburn, hematuria, incontinence, genital sores, muscle weakness, suspicious skin lesions, transient blindness, difficulty walking, depression, unusual weight change, abnormal bleeding, enlarged lymph nodes, angioedema, and breast masses.  Past Medical History: Past Medical History:  Diagnosis Date  . Abdominal aortic aneurysm (AAA) ([place])   . Arthritis    "shoulders" (04/24/2017)  . Atrial fibrillation with RVR ([place]) 03/2017   [name] 03/08/2017  . COPD (chronic obstructive pulmonary disease) ([place])   . Coronary artery disease   . Dementia   . Fall at home 06/17/2017   nasal fracture  . High cholesterol   . HOH (hard of hearing)   . Hypertension   . Pneumonia ~ 2003-2008 X 5   "5 straight years S/P pneumonia shot in ~ 2003"(04/24/2017)  . Skin cancer 2018   "cut it out of right arm"   Past Surgical History:  Procedure Laterality Date  . APPENDECTOMY    . CARDIAC CATHETERIZATION    . CATARACT EXTRACTION, BILATERAL Bilateral   . CORONARY ANGIOPLASTY WITH STENT PLACEMENT    . CORONARY ATHERECTOMY N/A 04/24/2017   Procedure: CORONARY ATHERECTOMY;  Surgeon: Adrian Prows, MD;  Location: [facility];  Service: Cardiovascular;  Laterality: N/A;  . CORONARY STENT INTERVENTION N/A 04/24/2017   Procedure: CORONARY STENT INTERVENTION;  Surgeon: Adrian Prows, MD;  Location: [facility];  Service: Cardiovascular;  Laterality: N/A;  . INCISION AND DRAINAGE  1950s   "RLE; got hurt in service; had to get a bunch of stuff out"  . LEFT HEART CATH AND CORONARY ANGIOGRAPHY N/A 03/19/2017   Procedure: LEFT HEART CATH AND CORONARY ANGIOGRAPHY;  Surgeon: Adrian Prows, MD;   Location: [facility];  Service: Cardiovascular;  Laterality: N/A;  . SKIN CANCER EXCISION Right 2018   "arms"  . TOOTH EXTRACTION      Medications: Prior to Admission medications   Medication Sig Start Date End Date Taking? Authorizing Provider  acetaminophen (TYLENOL) 325 MG tablet Take 1-2 tablets (325-650 mg total) by mouth every 4 (four) hours as needed for mild pain. 07/09/17   Love, Ivan Anchors, PA-C  amiodarone (PACERONE) 200 MG tablet Take 1 tablet (200 mg total) by mouth daily. 07/10/17   Love, Ivan Anchors, PA-C  atorvastatin (LIPITOR) 10 MG tablet Take 1 tablet (10 mg total) by mouth daily. 07/10/17   Love, Ivan Anchors, PA-C  digoxin (LANOXIN) 0.125 MG tablet Take 1 tablet (0.125 mg total) by mouth daily. 07/10/17   Love, Ivan Anchors, PA-C  diltiazem (CARDIZEM) 60 MG tablet Take 1 tablet (60 mg total) by mouth every 8 (eight) hours. 07/10/17   Love, Ivan Anchors, PA-C  docusate sodium (COLACE) 100 MG capsule Take 1 capsule (100 mg total) by mouth 2 (two) times daily as needed for mild constipation. 07/10/17   Love, Ivan Anchors, PA-C  Dutasteride-Tamsulosin HCl (JALYN) 0.5-0.4 MG CAPS Take 1 tablet by mouth daily. 07/10/17   Love, Ivan Anchors, PA-C  feeding supplement, ENSURE ENLIVE, (ENSURE ENLIVE) LIQD Take 237 mLs by mouth 4 (four) times daily -  with meals and at bedtime. 07/09/17   Love, Ivan Anchors, PA-C  megestrol (MEGACE) 400 MG/10ML suspension Take 10 mLs (400 mg total) by mouth 2 (two) times daily. 07/10/17  Love, Pamela S, PA-C  metoprolol tartrate (LOPRESSOR) 50 MG tablet Take 1 tablet (50 mg total) by mouth 2 (two) times daily. 06/25/17   Raiford Noble Latif, DO  Multiple Vitamin (MULTIVITAMIN WITH MINERALS) TABS tablet Take 1 tablet by mouth daily. 07/10/17   Love, Ivan Anchors, PA-C  polyethylene glycol (MIRALAX / GLYCOLAX) packet Take 17 g by mouth daily. 06/26/17   Raiford Noble Latif, DO  QUEtiapine (SEROQUEL) 25 MG tablet Take 1 tablet (25 mg total) by mouth daily at 8 pm. 07/10/17   Love, Ivan Anchors, PA-C   Rivaroxaban (XARELTO) 15 MG TABS tablet Take 1 tablet (15 mg total) by mouth daily with supper. Start this evening 07/10/17   Love, Ivan Anchors, PA-C    Allergies:   Allergies  Allergen Reactions  . Phenergan [Promethazine Hcl] Other (See Comments)    Confusion     Social History:  reports that he has been smoking cigarettes.  He has a 6.50 pack-year smoking history. He has quit using smokeless tobacco. He reports that he does not drink alcohol or use drugs.  Family History: Family History  Problem Relation Age of Onset  . Heart attack Father   . Cancer Sister   . Lung cancer Brother     Physical Exam: Vitals:   07/14/17 1521 07/14/17 2049  BP: 123/73 119/63  Pulse: 67 90  Resp: 18 14  Temp: 97.8 F (36.6 C)   TempSrc: Oral   SpO2: 96% 98%    General:  Alert and oriented times three, well developed and nourished, no acute distress Eyes: PERRLA, pink conjunctiva, no scleral icterus ENT: Moist oral mucosa, neck supple, no thyromegaly Lungs: clear to ascultation, no wheeze, no crackles, no use of accessory muscles Cardiovascular: regular rate and rhythm, no regurgitation, no gallops, no murmurs. No carotid bruits, no JVD Abdomen: soft, positive BS, non-tender, non-distended, no organomegaly, not an acute abdomen GU: not examined Neuro: CN II - XII grossly intact, sensation intact Musculoskeletal: strength 5/5 all extremities, no clubbing, cyanosis or edema Skin: no rash, no subcutaneous crepitation, no decubitus Psych: appropriate patient   Labs on Admission:  Recent Labs    07/14/17 1744  NA 138  K 4.1  CL 104  CO2 27  GLUCOSE 133*  BUN 18  CREATININE 0.89  CALCIUM 8.2*   Recent Labs    07/14/17 1744  AST 24  ALT 26  ALKPHOS 81  BILITOT 0.7  PROT 6.4*  ALBUMIN 2.3*   No results for input(s): LIPASE, AMYLASE in the last 72 hours. Recent Labs    07/14/17 1744  WBC 13.2*  NEUTROABS 10.5*  HGB 10.6*  HCT 33.0*  MCV 93.8  PLT 188   No results for  input(s): CKTOTAL, CKMB, CKMBINDEX, TROPONINI in the last 72 hours. Invalid input(s): POCBNP No results for input(s): DDIMER in the last 72 hours. No results for input(s): HGBA1C in the last 72 hours. No results for input(s): CHOL, HDL, LDLCALC, TRIG, CHOLHDL, LDLDIRECT in the last 72 hours. No results for input(s): TSH, T4TOTAL, T3FREE, THYROIDAB in the last 72 hours.  Invalid input(s): FREET3 No results for input(s): VITAMINB12, FOLATE, FERRITIN, TIBC, IRON, RETICCTPCT in the last 72 hours.  Micro Results: Recent Results (from the past 240 hour(s))  Culture, Urine     Status: None   Collection Time: 07/06/17  8:32 PM  Result Value Ref Range Status   Specimen Description URINE, CATHETERIZED  Final   Special Requests NONE  Final   Culture  Final    NO GROWTH Performed at [facility], [place] [address], [place], [place] [address]    Report Status 07/08/2017 FINAL  Final     Radiological Exams on Admission: No results found.  Assessment/Plan Present on Admission: . Gross hematuria -  Chronic anticoaggulation -  ?UTI -  . Acute blood loss anemia -  . Atrial fibrillation ([place]) -  . CAD (coronary artery disease), native coronary artery -  . Chronic obstructive pulmonary disease ([place]) -  . Dementia -   Jeremy Macdonald 07/14/2017, 11:00 PM

## 2017-07-14 NOTE — ED Triage Notes (Signed)
Pt wife reports hematuria and less urine output than normal in patient's foley today. She also states that he was confused and got up in the middle of the night 2x last night. This morning there was a lot of blood in his bed and over his legs. Pt is AxOx3.

## 2017-07-14 NOTE — ED Notes (Signed)
Called patient to move to room. Unable to locate patient at this time.

## 2017-07-14 NOTE — Progress Notes (Addendum)
Referring MD: Shirlyn Goltz, MD Reason: Urinary retention and inability to Place Foley catheter.    Subjective: Patient is seen as an ER consultation for urinary retention and inability to Place Foley catheter.  The patient was initially seen by Dr. Alyson Ingles while in the hospital after a fall.  He was found to be in acute urinary retention on 06/22/17 and a Foley catheter had been placed at that time but his urine was noted to be bloody.  He has a history of BPH and had been taking Avodart and tamsulosin and had seen Dr. Gaynelle Arabian in the past.  He has a history of significant obstructive voiding symptoms that seem to have worsened somewhat over the recent past month few months.  While he was in the hospital in 1/19 he was also seen by Dr. Vikki Ports after a Foley catheter had been removed and once again one could not be placed easily but Dr. Vikki Ports was able to pass a 63 French Foley catheter without difficulty.  This was again removed during that hospitalization and an attempt once again at placing a Foley catheter was unsuccessful.  At that time he underwent cystoscopy which revealed what appeared to be a normal urethra down to the sphincter and what was either a possible false passage in the prostatic urethra versus some clot.  He has a known prostate volume of 130 cc by previous CT scan. His Foley catheter stopped draining and he was having suprapubic discomfort.  He came to the emergency room where attempts were made to irrigate the Foley catheter but these were unsuccessful therefore his Foley catheter was removed and several attempts using straight and coud catheters were unsuccessful and I was contacted for catheter placement.  The patient takes Xarelto.  Objective: Vital signs in last 24 hours: Temp:  [97.8 F (36.6 C)] 97.8 F (36.6 C) (02/09 1521) Pulse Rate:  [67-94] 67 (02/09 1521) Resp:  [18] 18 (02/09 1521) BP: (123-139)/(73-88) 123/73 (02/09 1521) SpO2:  [93 %-96 %] 96 % (02/09  1521)  Intake/Output from previous day: No intake/output data recorded. Intake/Output this shift: No intake/output data recorded.  Physical Exam:  General:alert, cooperative and mild distress.  He seems somewhat confused. Abdomen is slightly tender in the suprapubic region. GU: He has a circumcised phallus with normal meatus.    Procedure: I discussed the procedure with the patient in detail and obtained verbal consent.  The  patient's genitalia was  then prepped with Betadine.  The 17 French flexible cystoscope was passed down the urethra.  There was some clot in the bulbar urethra but I could not definitely discern a false passage although one could have been present.  The prostatic urethra was entered without difficulty and he was noted to have an elongated prostatic urethra with bilobar hypertrophy and a long prostatic urethra.  I entered the bladder with the cystoscope and then passed a 0.038 inch sensor guidewire through the cystoscope and into the bladder.  This was left in place as I removed the cystoscope. A 22 [employer] tip catheter was then passed over the guidewire into the bladder and the balloon was filled with 20 cc of sterile water.  I then used 1 L of sterile saline and vigorously irrigated the bladder.  I was able to get out a lot of clots but by the end of the irrigation the irrigant was returning completely clear.  I therefore hooked his Foley catheter to closed system drainage.  Lab Results: Recent Labs  07/14/17 1744  HGB 10.6*  HCT 33.0*   BMET Recent Labs    07/14/17 1744  NA 138  K 4.1  CL 104  CO2 27  GLUCOSE 133*  BUN 18  CREATININE 0.89  CALCIUM 8.2*   No results for input(s): LABPT, INR in the last 72 hours. No results for input(s): LABURIN in the last 72 hours. Results for orders placed or performed during the hospital encounter of 06/25/17  Culture, Urine     Status: None   Collection Time: 06/29/17 12:05 AM  Result Value Ref Range Status    Specimen Description URINE, CATHETERIZED  Final   Special Requests NONE  Final   Culture NO GROWTH  Final   Report Status 06/30/2017 FINAL  Final  Urine Culture     Status: Abnormal   Collection Time: 07/03/17  9:14 AM  Result Value Ref Range Status   Specimen Description URINE, RANDOM  Final   Special Requests NONE  Final   Culture MULTIPLE SPECIES PRESENT, SUGGEST RECOLLECTION (A)  Final   Report Status 07/04/2017 FINAL  Final  Culture, Urine     Status: None   Collection Time: 07/06/17  8:32 PM  Result Value Ref Range Status   Specimen Description URINE, CATHETERIZED  Final   Special Requests NONE  Final   Culture   Final    NO GROWTH Performed at [facility], [place] [address] [place], [place] [address]    Report Status 07/08/2017 FINAL  Final    Studies/Results: No results found.      Assessment/Plan: Acute urinary retention secondary to BPH and difficult Foley catheter placement.  He now has a Foley catheter indwelling.  He is going to force fluids and will remain on his Xarelto unless he has further bleeding.  He will then follow-up as an outpatient for voiding trial.  Foley catheter on discharge.  Follow-up with Dr. Alyson Ingles as an outpatient.   LOS: 0 days   [name] C 07/14/2017, 6:36 PM

## 2017-07-14 NOTE — ED Triage Notes (Signed)
Son stated, He had his catheter inserted over a week ago and now its bleeding all around it,

## 2017-07-14 NOTE — ED Notes (Signed)
Pt foley Cath has leak in balloon, Dr. Darl Householder made aware and stated keep secure do not add tension at this time.

## 2017-07-14 NOTE — ED Notes (Signed)
Called x 3 in lobby/NO answer

## 2017-07-14 NOTE — ED Provider Notes (Signed)
[facility] DEPT Provider Note   CSN: [id number] Arrival date & time: 07/14/17  1437     History   Chief Complaint Chief Complaint  Patient presents with  . Hematuria    HPI Jeremy Macdonald is a 84 y.o. male history of atrial fibrillation on Xarelto, CAD, COPD, dementia, here presenting with hematuria.  Patient was recently admitted for rapid A. fib and was in rehab for the last several weeks.  Patient was discharged home 4 days ago.  Patient had acute urinary retention in the hospital and rehab and required multiple Foley replacements.  Eventually, urology had to place a Foley under cystoscopy.  Patient got up in the middle of night twice yesterday and wife states that he may have tugged on the Foley.  She noticed frank hematuria this morning as well as leakage around the Foley.  Denies any fevers or chills.  He has generalized weakness but that is chronic. Patient is still on xarelto.   The history is provided by the patient.    Past Medical History:  Diagnosis Date  . Abdominal aortic aneurysm (AAA) ([place])   . Arthritis    "shoulders" (04/24/2017)  . Atrial fibrillation with RVR ([place]) 03/2017   [name] 03/08/2017  . COPD (chronic obstructive pulmonary disease) ([place])   . Coronary artery disease   . Dementia   . Fall at home 06/17/2017   nasal fracture  . High cholesterol   . HOH (hard of hearing)   . Hypertension   . Pneumonia ~ 2003-2008 X 5   "5 straight years S/P pneumonia shot in ~ 2003"(04/24/2017)  . Skin cancer 2018   "cut it out of right arm"    Patient Active Problem List   Diagnosis Date Noted  . Pain   . Debility 06/25/2017  . Chronic obstructive pulmonary disease ([place])   . Bilateral hearing loss   . Leukocytosis   . Gross hematuria   . Acute blood loss anemia   . Hypoalbuminemia   . Malnutrition of moderate degree 06/24/2017  . Dementia 06/21/2017  . Hyperglycemia 06/21/2017  . BPH (benign prostatic hyperplasia)  06/21/2017  . Dizziness 06/21/2017  . Atrial fibrillation with rapid ventricular response ([place]) 06/20/2017  . Atrial fibrillation ([place]) 06/20/2017  . Post PTCA 04/24/2017  . CAD (coronary artery disease), native coronary artery 04/22/2017  . Angina pectoris ([place]) 04/22/2017  . PAF (paroxysmal atrial fibrillation) ([place]) 03/18/2017  . Abnormal nuclear stress test 03/18/2017    Past Surgical History:  Procedure Laterality Date  . APPENDECTOMY    . CARDIAC CATHETERIZATION    . CATARACT EXTRACTION, BILATERAL Bilateral   . CORONARY ANGIOPLASTY WITH STENT PLACEMENT    . CORONARY ATHERECTOMY N/A 04/24/2017   Procedure: CORONARY ATHERECTOMY;  Surgeon: Adrian Prows, MD;  Location: [facility];  Service: Cardiovascular;  Laterality: N/A;  . CORONARY STENT INTERVENTION N/A 04/24/2017   Procedure: CORONARY STENT INTERVENTION;  Surgeon: Adrian Prows, MD;  Location: [facility];  Service: Cardiovascular;  Laterality: N/A;  . INCISION AND DRAINAGE  1950s   "RLE; got hurt in service; had to get a bunch of stuff out"  . LEFT HEART CATH AND CORONARY ANGIOGRAPHY N/A 03/19/2017   Procedure: LEFT HEART CATH AND CORONARY ANGIOGRAPHY;  Surgeon: Adrian Prows, MD;  Location: [facility];  Service: Cardiovascular;  Laterality: N/A;  . SKIN CANCER EXCISION Right 2018   "arms"  . [place]  Medications    Prior to Admission medications   Medication Sig Start Date End Date Taking? Authorizing Provider  acetaminophen (TYLENOL) 325 MG tablet Take 1-2 tablets (325-650 mg total) by mouth every 4 (four) hours as needed for mild pain. 07/09/17   Love, Ivan Anchors, PA-C  amiodarone (PACERONE) 200 MG tablet Take 1 tablet (200 mg total) by mouth daily. 07/10/17   Love, Ivan Anchors, PA-C  atorvastatin (LIPITOR) 10 MG tablet Take 1 tablet (10 mg total) by mouth daily. 07/10/17   Love, Ivan Anchors, PA-C  digoxin (LANOXIN) 0.125 MG tablet Take 1 tablet (0.125 mg total) by mouth daily. 07/10/17   Love,  Ivan Anchors, PA-C  diltiazem (CARDIZEM) 60 MG tablet Take 1 tablet (60 mg total) by mouth every 8 (eight) hours. 07/10/17   Love, Ivan Anchors, PA-C  docusate sodium (COLACE) 100 MG capsule Take 1 capsule (100 mg total) by mouth 2 (two) times daily as needed for mild constipation. 07/10/17   Love, Ivan Anchors, PA-C  Dutasteride-Tamsulosin HCl (JALYN) 0.5-0.4 MG CAPS Take 1 tablet by mouth daily. 07/10/17   Love, Ivan Anchors, PA-C  feeding supplement, ENSURE ENLIVE, (ENSURE ENLIVE) LIQD Take 237 mLs by mouth 4 (four) times daily -  with meals and at bedtime. 07/09/17   Love, Ivan Anchors, PA-C  megestrol (MEGACE) 400 MG/10ML suspension Take 10 mLs (400 mg total) by mouth 2 (two) times daily. 07/10/17   Love, Ivan Anchors, PA-C  metoprolol tartrate (LOPRESSOR) 50 MG tablet Take 1 tablet (50 mg total) by mouth 2 (two) times daily. 06/25/17   Raiford Noble Latif, DO  Multiple Vitamin (MULTIVITAMIN WITH MINERALS) TABS tablet Take 1 tablet by mouth daily. 07/10/17   Love, Ivan Anchors, PA-C  polyethylene glycol (MIRALAX / GLYCOLAX) packet Take 17 g by mouth daily. 06/26/17   Raiford Noble Latif, DO  QUEtiapine (SEROQUEL) 25 MG tablet Take 1 tablet (25 mg total) by mouth daily at 8 pm. 07/10/17   Love, Ivan Anchors, PA-C  Rivaroxaban (XARELTO) 15 MG TABS tablet Take 1 tablet (15 mg total) by mouth daily with supper. Start this evening 07/10/17   Love, Ivan Anchors, PA-C    Family History Family History  Problem Relation Age of Onset  . Heart attack Father   . Cancer Sister   . Lung cancer Brother     Social History Social History   Tobacco Use  . Smoking status: Current Every Day Smoker    Packs/day: 0.10    Years: 65.00    Pack years: 6.50    Types: Cigarettes  . Smokeless tobacco: Former Systems developer  . Tobacco comment: smokes cigarettes about 2/day now  Substance Use Topics  . Alcohol use: No  . Drug use: No     Allergies   Phenergan [promethazine hcl]   Review of Systems Review of Systems  Genitourinary: Positive for hematuria.  All  other systems reviewed and are negative.    Physical Exam Updated Vital Signs BP 119/63 (BP Location: Left Arm)   Pulse 90   Temp 97.8 F (36.6 C) (Oral)   Resp 14   SpO2 98%   Physical Exam  Constitutional: He is oriented to person, place, and time.  Chronically ill   HENT:  Head: Normocephalic.  Mouth/Throat: Oropharynx is clear and moist.  Eyes: Conjunctivae and EOM are normal. Pupils are equal, round, and reactive to light.  Neck: Normal range of motion. Neck supple.  Cardiovascular: Normal rate, regular rhythm and normal heart sounds.  Pulmonary/Chest: Effort normal  and breath sounds normal. No stridor. No respiratory distress. He has no wheezes.  Abdominal: Soft. Bowel sounds are normal. There is no tenderness.  Genitourinary:  Genitourinary Comments: Foley with blood clots in the tubing and foley bag   Musculoskeletal: Normal range of motion.  Neurological: He is alert and oriented to person, place, and time. No cranial nerve deficit. Coordination normal.  Skin: Skin is warm.  Psychiatric: He has a normal mood and affect.  Nursing note and vitals reviewed.    ED Treatments / Results  Labs (all labs ordered are listed, but only abnormal results are displayed) Labs Reviewed  URINALYSIS, ROUTINE W REFLEX MICROSCOPIC - Abnormal; Notable for the following components:      Result Value   Color, Urine RED (*)    APPearance TURBID (*)    Glucose, UA   (*)    Value: TEST NOT REPORTED DUE TO COLOR INTERFERENCE OF URINE PIGMENT   Hgb urine dipstick   (*)    Value: TEST NOT REPORTED DUE TO COLOR INTERFERENCE OF URINE PIGMENT   Bilirubin Urine   (*)    Value: TEST NOT REPORTED DUE TO COLOR INTERFERENCE OF URINE PIGMENT   Ketones, ur   (*)    Value: TEST NOT REPORTED DUE TO COLOR INTERFERENCE OF URINE PIGMENT   Protein, ur   (*)    Value: TEST NOT REPORTED DUE TO COLOR INTERFERENCE OF URINE PIGMENT   Nitrite   (*)    Value: TEST NOT REPORTED DUE TO COLOR INTERFERENCE OF  URINE PIGMENT   Leukocytes, UA   (*)    Value: TEST NOT REPORTED DUE TO COLOR INTERFERENCE OF URINE PIGMENT   All other components within normal limits  CBC WITH DIFFERENTIAL/PLATELET - Abnormal; Notable for the following components:   WBC 13.2 (*)    RBC 3.52 (*)    Hemoglobin 10.6 (*)    HCT 33.0 (*)    Neutro Abs 10.5 (*)    Monocytes Absolute 1.3 (*)    All other components within normal limits  COMPREHENSIVE METABOLIC PANEL - Abnormal; Notable for the following components:   Glucose, Bld 133 (*)    Calcium 8.2 (*)    Total Protein 6.4 (*)    Albumin 2.3 (*)    All other components within normal limits  URINALYSIS, MICROSCOPIC (REFLEX) - Abnormal; Notable for the following components:   Squamous Epithelial / LPF 0-5 (*)    All other components within normal limits  URINE CULTURE  I-STAT TROPONIN, ED    EKG  EKG Interpretation None       Radiology No results found.  Procedures Procedures (including critical care time)  Medications Ordered in ED Medications  lidocaine (XYLOCAINE) 2 % jelly 1 application (1 application Urethral Given 07/14/17 1712)  morphine 4 MG/ML injection 4 mg (4 mg Intravenous Given 07/14/17 1752)  cefTRIAXone (ROCEPHIN) 1 g in dextrose 5 % 50 mL IVPB (1 g Intravenous New Bag/Given 07/14/17 2134)  sodium chloride 0.9 % bolus 1,000 mL (0 mLs Intravenous Stopped 07/14/17 2134)  sodium chloride 0.9 % bolus 1,000 mL (1,000 mLs Intravenous New Bag/Given 07/14/17 2133)     Initial Impression / Assessment and Plan / ED Course  I have reviewed the triage vital signs and the nursing notes.  Pertinent labs & imaging results that were available during my care of the patient were reviewed by me and considered in my medical decision making (see chart for details).    Sreekar DARTANYON FRANKOWSKI is  a 84 y.o. male here with hematuria. He is on xarelto and had multiple foley replacements recently. I attempted to irrigate foley but there is no urine that came out. I  performed bedside US and there is large clot in the bladder. Will replace with 3 way catheter, check labs. May need bladder irrigation.   5:30 pm Nurse and I both tried placing foleys. We tried 54 F 3 way and 20 F 3 way coude and 6 F coude but were unsuccessful. I called urology to help place foley.   6:30 pm Dr. Karsten Ro at bedside putting in foley. There is some hematuria that improved initially. Will observe and make sure that hematuria resolves.   8 pm Patient's foley has more clots and hematuria got worse. I tried irrigating the foley. Ordered 1 L NS bolus and rocephin given that he had multiple foley placements today.   9:30pm Hematuria worsened. I irrigated the foley again. There are more clots coming out. Will give second NS bolus.   10:34 PM Patient's hematuria is still persistent. Irrigated foley again with saline and minimal clots came out. I called Dr. Karsten Ro who recommend some traction on the foley and observation. He will reassess patient in AM. I think likely continued bleeding from the urethra or prostate. Doesn't have three way foley currently so can't start CBI currently. Hospitalist to admit for observation.       Final Clinical Impressions(s) / ED Diagnoses   Final diagnoses:  None    ED Discharge Orders    None       Drenda Freeze, MD 07/14/17 [id number]

## 2017-07-15 ENCOUNTER — Inpatient Hospital Stay (HOSPITAL_COMMUNITY): Payer: Medicare Other

## 2017-07-15 ENCOUNTER — Other Ambulatory Visit: Payer: Self-pay

## 2017-07-15 DIAGNOSIS — I482 Chronic atrial fibrillation: Secondary | ICD-10-CM | POA: Diagnosis not present

## 2017-07-15 DIAGNOSIS — R1312 Dysphagia, oropharyngeal phase: Secondary | ICD-10-CM | POA: Diagnosis not present

## 2017-07-15 DIAGNOSIS — Z888 Allergy status to other drugs, medicaments and biological substances status: Secondary | ICD-10-CM | POA: Diagnosis not present

## 2017-07-15 DIAGNOSIS — R319 Hematuria, unspecified: Secondary | ICD-10-CM | POA: Diagnosis present

## 2017-07-15 DIAGNOSIS — I2510 Atherosclerotic heart disease of native coronary artery without angina pectoris: Secondary | ICD-10-CM | POA: Diagnosis not present

## 2017-07-15 DIAGNOSIS — K5730 Diverticulosis of large intestine without perforation or abscess without bleeding: Secondary | ICD-10-CM | POA: Diagnosis not present

## 2017-07-15 DIAGNOSIS — F0390 Unspecified dementia without behavioral disturbance: Secondary | ICD-10-CM

## 2017-07-15 DIAGNOSIS — R4182 Altered mental status, unspecified: Secondary | ICD-10-CM | POA: Diagnosis not present

## 2017-07-15 DIAGNOSIS — Z936 Other artificial openings of urinary tract status: Secondary | ICD-10-CM | POA: Diagnosis not present

## 2017-07-15 DIAGNOSIS — R140 Abdominal distension (gaseous): Secondary | ICD-10-CM | POA: Diagnosis not present

## 2017-07-15 DIAGNOSIS — J440 Chronic obstructive pulmonary disease with acute lower respiratory infection: Secondary | ICD-10-CM | POA: Diagnosis not present

## 2017-07-15 DIAGNOSIS — X58XXXA Exposure to other specified factors, initial encounter: Secondary | ICD-10-CM | POA: Diagnosis present

## 2017-07-15 DIAGNOSIS — I714 Abdominal aortic aneurysm, without rupture: Secondary | ICD-10-CM | POA: Diagnosis not present

## 2017-07-15 DIAGNOSIS — N139 Obstructive and reflux uropathy, unspecified: Secondary | ICD-10-CM | POA: Diagnosis not present

## 2017-07-15 DIAGNOSIS — E785 Hyperlipidemia, unspecified: Secondary | ICD-10-CM | POA: Diagnosis not present

## 2017-07-15 DIAGNOSIS — Z7901 Long term (current) use of anticoagulants: Secondary | ICD-10-CM

## 2017-07-15 DIAGNOSIS — Z66 Do not resuscitate: Secondary | ICD-10-CM | POA: Diagnosis not present

## 2017-07-15 DIAGNOSIS — F17210 Nicotine dependence, cigarettes, uncomplicated: Secondary | ICD-10-CM | POA: Diagnosis present

## 2017-07-15 DIAGNOSIS — R310 Gross hematuria: Secondary | ICD-10-CM | POA: Diagnosis not present

## 2017-07-15 DIAGNOSIS — H9193 Unspecified hearing loss, bilateral: Secondary | ICD-10-CM | POA: Diagnosis not present

## 2017-07-15 DIAGNOSIS — F0150 Vascular dementia without behavioral disturbance: Secondary | ICD-10-CM | POA: Diagnosis not present

## 2017-07-15 DIAGNOSIS — I10 Essential (primary) hypertension: Secondary | ICD-10-CM | POA: Diagnosis present

## 2017-07-15 DIAGNOSIS — R1030 Lower abdominal pain, unspecified: Secondary | ICD-10-CM | POA: Diagnosis not present

## 2017-07-15 DIAGNOSIS — J449 Chronic obstructive pulmonary disease, unspecified: Secondary | ICD-10-CM | POA: Diagnosis not present

## 2017-07-15 DIAGNOSIS — E440 Moderate protein-calorie malnutrition: Secondary | ICD-10-CM | POA: Diagnosis not present

## 2017-07-15 DIAGNOSIS — M6281 Muscle weakness (generalized): Secondary | ICD-10-CM | POA: Diagnosis not present

## 2017-07-15 DIAGNOSIS — F0280 Dementia in other diseases classified elsewhere without behavioral disturbance: Secondary | ICD-10-CM | POA: Diagnosis not present

## 2017-07-15 DIAGNOSIS — N401 Enlarged prostate with lower urinary tract symptoms: Secondary | ICD-10-CM | POA: Diagnosis present

## 2017-07-15 DIAGNOSIS — S3739XA Other injury of urethra, initial encounter: Secondary | ICD-10-CM | POA: Diagnosis not present

## 2017-07-15 DIAGNOSIS — I513 Intracardiac thrombosis, not elsewhere classified: Secondary | ICD-10-CM | POA: Diagnosis not present

## 2017-07-15 DIAGNOSIS — R338 Other retention of urine: Secondary | ICD-10-CM | POA: Diagnosis present

## 2017-07-15 DIAGNOSIS — D62 Acute posthemorrhagic anemia: Secondary | ICD-10-CM | POA: Diagnosis not present

## 2017-07-15 DIAGNOSIS — E7800 Pure hypercholesterolemia, unspecified: Secondary | ICD-10-CM | POA: Diagnosis present

## 2017-07-15 DIAGNOSIS — Z466 Encounter for fitting and adjustment of urinary device: Secondary | ICD-10-CM | POA: Diagnosis not present

## 2017-07-15 DIAGNOSIS — Z8701 Personal history of pneumonia (recurrent): Secondary | ICD-10-CM | POA: Diagnosis not present

## 2017-07-15 DIAGNOSIS — Z85828 Personal history of other malignant neoplasm of skin: Secondary | ICD-10-CM | POA: Diagnosis not present

## 2017-07-15 DIAGNOSIS — R41841 Cognitive communication deficit: Secondary | ICD-10-CM | POA: Diagnosis not present

## 2017-07-15 DIAGNOSIS — N3289 Other specified disorders of bladder: Secondary | ICD-10-CM | POA: Diagnosis not present

## 2017-07-15 DIAGNOSIS — Z79899 Other long term (current) drug therapy: Secondary | ICD-10-CM | POA: Diagnosis not present

## 2017-07-15 DIAGNOSIS — K5900 Constipation, unspecified: Secondary | ICD-10-CM | POA: Diagnosis not present

## 2017-07-15 DIAGNOSIS — J181 Lobar pneumonia, unspecified organism: Secondary | ICD-10-CM | POA: Diagnosis not present

## 2017-07-15 DIAGNOSIS — R339 Retention of urine, unspecified: Secondary | ICD-10-CM | POA: Diagnosis not present

## 2017-07-15 DIAGNOSIS — J189 Pneumonia, unspecified organism: Secondary | ICD-10-CM | POA: Diagnosis not present

## 2017-07-15 DIAGNOSIS — Z9181 History of falling: Secondary | ICD-10-CM | POA: Diagnosis not present

## 2017-07-15 DIAGNOSIS — I480 Paroxysmal atrial fibrillation: Secondary | ICD-10-CM | POA: Diagnosis not present

## 2017-07-15 LAB — HEMOGLOBIN AND HEMATOCRIT, BLOOD
HCT: 30.6 % — ABNORMAL LOW (ref 39.0–52.0)
HCT: 27.3 % — ABNORMAL LOW (ref 39.0–52.0)
HEMATOCRIT: 34.2 % — AB (ref 39.0–52.0)
HEMOGLOBIN: 10.9 g/dL — AB (ref 13.0–17.0)
HEMOGLOBIN: 8.8 g/dL — AB (ref 13.0–17.0)
Hemoglobin: 9.8 g/dL — ABNORMAL LOW (ref 13.0–17.0)

## 2017-07-15 LAB — BASIC METABOLIC PANEL
Anion gap: 6 (ref 5–15)
BUN: 16 mg/dL (ref 6–20)
CHLORIDE: 106 mmol/L (ref 101–111)
CO2: 25 mmol/L (ref 22–32)
CREATININE: 0.77 mg/dL (ref 0.61–1.24)
Calcium: 7.6 mg/dL — ABNORMAL LOW (ref 8.9–10.3)
GFR calc Af Amer: >60 mL/min (ref >60)
GFR calc non Af Amer: >60 mL/min (ref >60)
GLUCOSE: 112 mg/dL — AB (ref 65–99)
Potassium: 4 mmol/L (ref 3.5–5.1)
SODIUM: 137 mmol/L (ref 135–145)

## 2017-07-15 LAB — DIGOXIN LEVEL: Digoxin Level: 0.9 ng/mL (ref 0.8–2.0)

## 2017-07-15 LAB — MAGNESIUM: MAGNESIUM: 1.6 mg/dL — AB (ref 1.7–2.4)

## 2017-07-15 LAB — PROTIME-INR
INR: 1.52
Prothrombin Time: 18.2 seconds — ABNORMAL HIGH (ref 11.4–15.2)

## 2017-07-15 MED ORDER — ACETAMINOPHEN 650 MG RE SUPP: 650.0000 mg | Freq: Four times a day (QID) | RECTAL | Status: DC | PRN

## 2017-07-15 MED ORDER — MAGNESIUM CITRATE PO SOLN
1.0000 | Freq: Once | ORAL | Status: DC
  Filled 2017-07-15: qty 296

## 2017-07-15 MED ORDER — QUETIAPINE FUMARATE 25 MG PO TABS
25.0000 mg | ORAL_TABLET | Freq: Every day | ORAL | Status: DC
  Administered 2017-07-15 – 2017-07-17 (×3): 25 mg via ORAL
  Filled 2017-07-15 (×3): qty 1

## 2017-07-15 MED ORDER — DIGOXIN 125 MCG PO TABS
0.1250 mg | ORAL_TABLET | Freq: Every day | ORAL | Status: DC
  Administered 2017-07-15 – 2017-07-18 (×4): 0.125 mg via ORAL
  Filled 2017-07-15 (×4): qty 1

## 2017-07-15 MED ORDER — MEGESTROL ACETATE 400 MG/10ML PO SUSP
400.0000 mg | Freq: Two times a day (BID) | ORAL | Status: DC
  Administered 2017-07-15 – 2017-07-18 (×7): 400 mg via ORAL
  Filled 2017-07-15 (×7): qty 10

## 2017-07-15 MED ORDER — AZITHROMYCIN 250 MG PO TABS
500.0000 mg | ORAL_TABLET | Freq: Every day | ORAL | Status: DC
  Administered 2017-07-15 – 2017-07-17 (×3): 500 mg via ORAL
  Filled 2017-07-15 (×3): qty 2

## 2017-07-15 MED ORDER — ADULT MULTIVITAMIN W/MINERALS CH
1.0000 | ORAL_TABLET | Freq: Every day | ORAL | Status: DC
  Administered 2017-07-15 – 2017-07-18 (×4): 1 via ORAL
  Filled 2017-07-15 (×4): qty 1

## 2017-07-15 MED ORDER — ACETAMINOPHEN 325 MG PO TABS: 650.0000 mg | ORAL_TABLET | Freq: Four times a day (QID) | ORAL | Status: DC | PRN

## 2017-07-15 MED ORDER — BELLADONNA ALKALOIDS-OPIUM 16.2-60 MG RE SUPP
1.0000 | Freq: Four times a day (QID) | RECTAL | Status: DC | PRN
  Administered 2017-07-15: 1 via RECTAL
  Filled 2017-07-15: qty 1

## 2017-07-15 MED ORDER — IOPAMIDOL (ISOVUE-300) INJECTION 61%
INTRAVENOUS | Status: AC
  Administered 2017-07-15: 100 mL
  Filled 2017-07-15: qty 100

## 2017-07-15 MED ORDER — HALOPERIDOL LACTATE 5 MG/ML IJ SOLN
2.0000 mg | Freq: Four times a day (QID) | INTRAMUSCULAR | Status: AC | PRN
  Administered 2017-07-15 – 2017-07-16 (×2): 2 mg via INTRAVENOUS
  Filled 2017-07-15 (×2): qty 1

## 2017-07-15 MED ORDER — TAMSULOSIN HCL 0.4 MG PO CAPS
0.4000 mg | ORAL_CAPSULE | Freq: Every day | ORAL | Status: DC
  Administered 2017-07-15 – 2017-07-18 (×4): 0.4 mg via ORAL
  Filled 2017-07-15 (×4): qty 1

## 2017-07-15 MED ORDER — ONDANSETRON HCL 4 MG PO TABS: 4.0000 mg | ORAL_TABLET | Freq: Four times a day (QID) | ORAL | Status: DC | PRN

## 2017-07-15 MED ORDER — DILTIAZEM HCL 60 MG PO TABS
60.0000 mg | ORAL_TABLET | Freq: Three times a day (TID) | ORAL | Status: DC
  Administered 2017-07-15 – 2017-07-18 (×11): 60 mg via ORAL
  Filled 2017-07-15 (×11): qty 1

## 2017-07-15 MED ORDER — AMIODARONE HCL 200 MG PO TABS
200.0000 mg | ORAL_TABLET | Freq: Every day | ORAL | Status: DC
  Administered 2017-07-15 – 2017-07-18 (×4): 200 mg via ORAL
  Filled 2017-07-15 (×4): qty 1

## 2017-07-15 MED ORDER — DUTASTERIDE-TAMSULOSIN HCL 0.5-0.4 MG PO CAPS: 1.0000 | ORAL_CAPSULE | Freq: Every day | ORAL | Status: DC

## 2017-07-15 MED ORDER — ONDANSETRON HCL 4 MG/2ML IJ SOLN
4.0000 mg | Freq: Four times a day (QID) | INTRAMUSCULAR | Status: DC | PRN
Start: 2017-07-15 — End: 2017-07-18

## 2017-07-15 MED ORDER — MAGNESIUM SULFATE 2 GM/50ML IV SOLN
2.0000 g | Freq: Once | INTRAVENOUS | Status: AC
  Administered 2017-07-15: 2 g via INTRAVENOUS
  Filled 2017-07-15: qty 50

## 2017-07-15 MED ORDER — CEFTRIAXONE SODIUM 1 G IJ SOLR
1.0000 g | INTRAMUSCULAR | Status: DC
  Administered 2017-07-15: 1 g via INTRAVENOUS
  Filled 2017-07-15: qty 10

## 2017-07-15 MED ORDER — ENOXAPARIN SODIUM 40 MG/0.4ML ~~LOC~~ SOLN
40.0000 mg | SUBCUTANEOUS | Status: DC
  Administered 2017-07-15 – 2017-07-18 (×4): 40 mg via SUBCUTANEOUS
  Filled 2017-07-15 (×4): qty 0.4

## 2017-07-15 MED ORDER — MORPHINE SULFATE (PF) 4 MG/ML IV SOLN
2.0000 mg | INTRAVENOUS | Status: DC | PRN
Start: 2017-07-15 — End: 2017-07-17
  Administered 2017-07-15 – 2017-07-17 (×6): 2 mg via INTRAVENOUS
  Filled 2017-07-15 (×6): qty 1

## 2017-07-15 MED ORDER — DOCUSATE SODIUM 100 MG PO CAPS: 100.0000 mg | ORAL_CAPSULE | Freq: Two times a day (BID) | ORAL | Status: DC | PRN

## 2017-07-15 MED ORDER — DUTASTERIDE 0.5 MG PO CAPS
0.5000 mg | ORAL_CAPSULE | Freq: Every day | ORAL | Status: DC
  Administered 2017-07-15 – 2017-07-18 (×4): 0.5 mg via ORAL
  Filled 2017-07-15 (×4): qty 1

## 2017-07-15 MED ORDER — METOPROLOL TARTRATE 50 MG PO TABS
50.0000 mg | ORAL_TABLET | Freq: Two times a day (BID) | ORAL | Status: DC
  Administered 2017-07-15 – 2017-07-18 (×8): 50 mg via ORAL
  Filled 2017-07-15 (×6): qty 1
  Filled 2017-07-15: qty 2
  Filled 2017-07-15: qty 1

## 2017-07-15 MED ORDER — ATORVASTATIN CALCIUM 10 MG PO TABS
10.0000 mg | ORAL_TABLET | Freq: Every day | ORAL | Status: DC
  Administered 2017-07-15 – 2017-07-17 (×3): 10 mg via ORAL
  Filled 2017-07-15 (×3): qty 1

## 2017-07-15 MED ORDER — SODIUM CHLORIDE 0.9 % IJ SOLN
INTRAMUSCULAR | Status: AC
  Administered 2017-07-15: 10 mL
  Filled 2017-07-15: qty 50

## 2017-07-15 MED ORDER — POLYETHYLENE GLYCOL 3350 17 G PO PACK: 17.0000 g | PACK | Freq: Every day | ORAL | Status: DC | PRN

## 2017-07-15 NOTE — Progress Notes (Signed)
Pt Son stated that pts last BM was yesterday and asked that we not give the mag citrate or enema. Pt states he does not feel constipated.

## 2017-07-15 NOTE — Progress Notes (Addendum)
PROGRESS NOTE    Jeremy Macdonald  UXL:[id number] DOB: 1925/12/27 DOA: 07/14/2017 PCP: Vernie Shanks, MD    Brief Narrative:  Mr. Shere is an 84 year old gentleman with a past medical history relevant for coronary disease status post PCI on 04/24/2017, paroxysmal atrial fibrillation on rivaroxaban, abdominal aortic aneurysm, complicated by mural thrombus, urinary retention with chronic Foley, dementia, COPD who was admitted with gross hematuria.   Assessment & Plan:   Active Problems:   CAD (coronary artery disease), native coronary artery   Atrial fibrillation (HCC)   Dementia   Chronic obstructive pulmonary disease (HCC)   Gross hematuria   Acute blood loss anemia   Chronic anticoagulation   HTN (hypertension)   Hematuria   ##) Gross hematuria with urinary retention: Secondary to patient recurrently pulling on his Foley when he becomes altered at home.  On review of the chart he was taken off of clopidogrel after approximately  3 months after PCI due to increased risk of bleeding. -Urology following appreciate recommendations -On 07/15/2017 urology did see the patient and did a bedside cystoscopy which showed only gross blood, 20 [place] coud-tipped three-way catheter was placed -Continue continuous bladder irrigation -Hold rivaroxaban  -continue dutasteride and tamsulosin daily  ##)abdominal pain: Patient reports significant difficulty with defecating.  He reports being quite constipated.  He is not tender however he is quite distended on exam.  He has diminished bowel sounds.  He does not have any emesis. -We will obtain abdominal x-ray -Magnesium citrate, enema if this does not work -ADDENDUM: Abdominal x-ray showed concern for possible obstruction, CT abdomen was done with contrast which showed no evidence of obstruction but did show possible pneumonia.  Patient was noted to have elevated white count.  Started patient on ceftriaxone and azithromycin.  ##) Coronary  artery disease status post PCI: - Continue aspirin 81 mg -Continue atorvastatin 10 mg daily - Continue metoprolol tartrate 50 mg twice a day  ##) Paroxysmal atrial fibrillation: - Hold rivaroxaban 15 mg -Continue aspirin 81 mg - Cardiology following for anticoagulation recommendations appreciate recommendations -Continue amiodarone 200 mg daily -Continue diltiazem 60 mg 3 times daily -Continue digoxin 0.125 mg daily  ##) Hypertension: -Continue metoprolol and diltiazem  ##) Dementia: Extensive discussion with the wife about his goals of care.  He has had multiple falls after being discharged from rehab approximately 1 week ago.  His wife expresses that he would not want to "live like this".  Discussed extensively with the family and the son and they feel like medications that would predispose him to having complications of required hospitalization including anticoagulants would not be in his best interests.  Patient agrees with this. -Hold anticoagulation, likely on discharge as well -Continue quetiapine 25 mg nightly - Mega Strohl, patient is on this for presumably appetite stimulation however with multiple thrombotic complications including coronary artery disease, atrial fibrillation, mural thrombus noted in AAA as well as his age it would be best to discontinue this medication  Fluids: Tolerating p.o. Electrolyte: Monitor and supplement Impression: Heart healthy diet  Prophylaxis: Enoxaparin 40 mg daily  Disposition: We will discuss with case management as his 26 year old wife can no longer take care of him at home  DO NOT RESUSCITATE   Consultants:   Cardiology  Urology  Procedures: (Don't include imaging studies which can be auto populated. Include things that cannot be auto populated i.e. Echo, Carotid and venous dopplers, Foley, Bipap, HD, tubes/drains, wound vac, central lines etc)  Bedside cystoscopy with placement of  three-way catheter on  07/15/2017  Antimicrobials: (specify start and planned stop date. Auto populated tables are space occupying and do not give end dates)  None   Subjective: Patient reports feeling fairly uncomfortable with abdominal pain secondary to unable to have a bowel movement.  He understands and agrees with an extensive goals of care discussion had with his wife.  Objective: Vitals:   07/15/17 0100 07/15/17 0147 07/15/17 0545 07/15/17 1010  BP: (!) 143/78 (!) 132/98 137/86 (!) 153/91  Pulse: 77 (!) 114 98 (!) 104  Resp: (!) 24 (!) 24 20   Temp:  98.4 F (36.9 C) 98.3 F (36.8 C)   TempSrc:  Oral Oral   SpO2: 93% 91% 93%   Weight:  72.1 kg (159 lb) 73.7 kg (162 lb 6.4 oz)   Height:  5\' 9"  (1.753 m)      Intake/Output Summary (Last 24 hours) at 07/15/2017 1041 Last data filed at 07/15/2017 1000 Gross per 24 hour  Intake 2050 ml  Output 1100 ml  Net 950 ml   Filed Weights   07/15/17 0147 07/15/17 0545  Weight: 72.1 kg (159 lb) 73.7 kg (162 lb 6.4 oz)    Examination:  General exam: Mildly uncomfortable Respiratory system: Clear to auscultation. Respiratory effort normal. Cardiovascular system: Distant heart sounds, regular rate and rhythm Gastrointestinal system: Abdomen is distended, but soft, mild tenderness to deep palpation, no rebound, positive bowel sounds Central nervous system: Alert and oriented to self and place, and situation but not to time Extremities: Trace lower extremity edema Skin: No rashes on visible skin Psychiatry: Judgement and insight appear normal.    Data Reviewed: CBC: Recent Labs  Lab 07/09/17 0822 07/10/17 0626 07/14/17 1744 07/15/17 0234 07/15/17 0957  WBC 8.0 9.0 13.2*  --   --   NEUTROABS  --   --  10.5*  --   --   HGB 11.7* 10.3* 10.6* 9.8* 10.9*  HCT 37.8* 34.0* 33.0* 30.6* 34.2*  MCV 96.7 96.0 93.8  --   --   PLT 268 233 188  --   --    Basic Metabolic Panel: Recent Labs  Lab 07/09/17 0822 07/10/17 0626 07/14/17 1744 07/15/17 0234   NA 138 139 138 137  K 4.1 4.1 4.1 4.0  CL 98* 100* 104 106  CO2 29 29 27 25   GLUCOSE 130* 122* 133* 112*  BUN 12 13 18 16   CREATININE 0.84 0.81 0.89 0.77  CALCIUM 8.3* 8.3* 8.2* 7.6*  MG  --   --   --  1.6*   GFR: Estimated Creatinine Clearance: 62.6 mL/min (by C-G formula based on SCr of 0.77 mg/dL). Liver Function Tests: Recent Labs  Lab 07/14/17 1744  AST 24  ALT 26  ALKPHOS 81  BILITOT 0.7  PROT 6.4*  ALBUMIN 2.3*   No results for input(s): LIPASE, AMYLASE in the last 168 hours. No results for input(s): AMMONIA in the last 168 hours. Coagulation Profile: Recent Labs  Lab 07/15/17 0234  INR 1.52   Cardiac Enzymes: No results for input(s): CKTOTAL, CKMB, CKMBINDEX, TROPONINI in the last 168 hours. BNP (last 3 results) No results for input(s): PROBNP in the last 8760 hours. HbA1C: No results for input(s): HGBA1C in the last 72 hours. CBG: No results for input(s): GLUCAP in the last 168 hours. Lipid Profile: No results for input(s): CHOL, HDL, LDLCALC, TRIG, CHOLHDL, LDLDIRECT in the last 72 hours. Thyroid Function Tests: No results for input(s): TSH, T4TOTAL, FREET4, T3FREE, THYROIDAB in the  last 72 hours. Anemia Panel: No results for input(s): VITAMINB12, FOLATE, FERRITIN, TIBC, IRON, RETICCTPCT in the last 72 hours. Sepsis Labs: No results for input(s): PROCALCITON, LATICACIDVEN in the last 168 hours.  Recent Results (from the past 240 hour(s))  Culture, Urine     Status: None   Collection Time: 07/06/17  8:32 PM  Result Value Ref Range Status   Specimen Description URINE, CATHETERIZED  Final   Special Requests NONE  Final   Culture   Final    NO GROWTH Performed at [facility], [address], [place], [place] [address]    Report Status 07/08/2017 FINAL  Final  Urine culture     Status: None (Preliminary result)   Collection Time: 07/14/17  8:48 PM  Result Value Ref Range Status   Specimen Description   Final    URINE, RANDOM Performed at  [facility] [address] [place], [place] [address]    Special Requests NONE  Final   Culture PENDING  Incomplete   Report Status PENDING  Incomplete         Radiology Studies: No results found.      Scheduled Meds: . amiodarone  200 mg Oral Daily  . atorvastatin  10 mg Oral q1800  . digoxin  0.125 mg Oral Daily  . diltiazem  60 mg Oral Q8H  . dutasteride  0.5 mg Oral Daily   And  . tamsulosin  0.4 mg Oral Daily  . magnesium citrate  1 Bottle Oral Once  . megestrol  400 mg Oral BID  . metoprolol tartrate  50 mg Oral BID  . multivitamin with minerals  1 tablet Oral Daily  . QUEtiapine  25 mg Oral Q2000   Continuous Infusions: . magnesium sulfate 1 - 4 g bolus IVPB       LOS: 0 days    Time spent: [place], MD [facility]   If 7PM-7AM, please contact night-coverage [email or web address] Password [facility] 07/15/2017, 10:41 AM

## 2017-07-15 NOTE — Progress Notes (Signed)
Directly after arrival to unit, pt was turned and this RN noticed foley catheter was out. Upon assessment of catheter the balloon did not appear to be inflated. On call provider made aware. States that urologist will be notified and will insert foley first thing in the morning. Until then, verbal order given to bladder scan q2h and notify if > 569ml. Will continue to monitor closely.

## 2017-07-15 NOTE — Progress Notes (Signed)
Patient ID: Jeremy Macdonald, male   DOB: 09-04-1925, 84 y.o.   MRN: [id number]  Procedure: I again discussed with the patient the procedure of cystoscopy and then proceeded to sterilely prep and drape his genitalia.  I once again passed the 29 French flexible cystoscope down the urethra which again noted to be normal.  The prostatic urethra really did not appear to be abnormal but was markedly elongated with marked bilobar hypertrophy and a high bladder neck.  I did not see any definite false passages.  Upon entering the bladder I noted bloody urine.  I passed the guidewire through the cystoscope and left this in place and removed the cystoscope.  I then passed a 50 [place] coud-tipped hematuria three-way Foley catheter with 30 cc balloon in the bladder over the guidewire and remove the guidewire.  I filled the balloon with 30 cc of water and then proceeded to irrigate the bladder with 1 L of fluid.  This time I found bloody urine but no clots and irrigated him until clear.  The catheter was placed on mild traction and hooked to continuous bladder irrigation.  Assessment: Gross hematuria - He will be maintained on continuous bladder irrigation until his urine clears.  Plan: 1.  New three-way Foley catheter inserted. 2.  Bladder irrigated until clear. 3.  Begin continuous bladder irrigation.   Subjective: Patient was found this morning with no Foley catheter in.  I was informed that his Foley catheter "fell out" with with the balloon deflated.  Objective: Vital signs in last 24 hours: Temp:  [97.8 F (36.6 C)-98.4 F (36.9 C)] 98.3 F (36.8 C) (02/10 0545) Pulse Rate:  [32-114] 98 (02/10 0545) Resp:  [14-24] 20 (02/10 0545) BP: (119-143)/(63-98) 137/86 (02/10 0545) SpO2:  [91 %-98 %] 93 % (02/10 0545) Weight:  [72.1 kg (159 lb)-73.7 kg (162 lb 6.4 oz)] 73.7 kg (162 lb 6.4 oz) (02/10 0545)A  Intake/Output from previous day: 02/09 0701 - 02/10 0700 In: 2050 [IV Piggyback:2050] Out: 500  [Urine:500] Intake/Output this shift: Total I/O In: 2050 [IV Piggyback:2050] Out: 500 [Urine:500]  Past Medical History:  Diagnosis Date  . Abdominal aortic aneurysm (AAA) ([place])   . Arthritis    "shoulders" (04/24/2017)  . Atrial fibrillation with RVR ([place]) 03/2017   [name] 03/08/2017  . COPD (chronic obstructive pulmonary disease) ([place])   . Coronary artery disease   . Dementia   . Fall at home 06/17/2017   nasal fracture  . High cholesterol   . HOH (hard of hearing)   . Hypertension   . Pneumonia ~ 2003-2008 X 5   "5 straight years S/P pneumonia shot in ~ 2003"(04/24/2017)  . Skin cancer 2018   "cut it out of right arm"    Physical Exam:  Lungs - Normal respiratory effort, chest expands symmetrically.  Abdomen - Soft, non-tender & non-distended.  Lab Results: Recent Labs    07/14/17 1744 07/15/17 0234  WBC 13.2*  --   HGB 10.6* 9.8*  HCT 33.0* 30.6*   BMET Recent Labs    07/14/17 1744 07/15/17 0234  NA 138 137  K 4.1 4.0  CL 104 106  CO2 27 25  GLUCOSE 133* 112*  BUN 18 16  CREATININE 0.89 0.77  CALCIUM 8.2* 7.6*   No results for input(s): LABURIN in the last 72 hours. Results for orders placed or performed during the hospital encounter of 07/14/17  Urine culture     Status: None (Preliminary result)   Collection Time: 07/14/17  8:48 PM  Result Value Ref Range Status   Specimen Description   Final    URINE, RANDOM Performed at [facility] [address], [place], [place] [address]    Special Requests NONE  Final   Culture PENDING  Incomplete   Report Status PENDING  Incomplete    Studies/Results: No results found.    Kathey Simer C 07/15/2017, 6:35 AM

## 2017-07-15 NOTE — H&P (Addendum)
PCP:   Vernie Shanks, MD   Chief Complaint:  hematuria  HPI: this is a 84 y/o male who presents with hematuria since this AM. He has chronic urinary retention and foley in place. He apparently woke up during the night and was tugging on his foley. The patient has confusion. In the the ER he was seen by Urology and irrigated and flushed and continues to bleed. The patient is on Xarelto for a fib. He also has a AAA and a mural thrombus seen in November 2018.    He has been had urinary retention since 11/18, and has had hematuria on and off since.   In November had post complex LAD PCI 04/24/17, paroxysmal atrial fibrillation, abdominal aortic aneurysm and was on xarelto and plavix. The plavix was d/ced in 06/15/17 to decrease the risk of bleeding but xarelto continued. There has been conversation re discontinuing xarelto because the patient has chronic dizziness and is a fall risk  He was d/c 4 days ago from [facility] rehab, during that hospitalization his foley had to be changed a few times with difficulty finally placed by Urology under cystocopy. Last night he tugged on it and has been bleeding since.  The hospitalist have been asked to brng patient overnight. History provided by wife and chart review. Patient is mostly confused. Per wife patient has been confused on and off recently since his fall where he suffered a contusion 1 month ago.  Review of Systems:  The patient denies anorexia, fever, confusion,  weight loss,, vision loss, decreased hearing, hoarseness, chest pain, syncope, dyspnea on exertion, peripheral edema, balance deficits, hemoptysis, abdominal pain, melena, hematochezia, severe indigestion/heartburn, hematuria, incontinence, hematuria, genital sores, muscle weakness, suspicious skin lesions, transient blindness, difficulty walking, depression, unusual weight change, abnormal bleeding, enlarged lymph nodes, angioedema, and breast masses.  Past Medical History: Past Medical  History:  Diagnosis Date  . Abdominal aortic aneurysm (AAA) ([place])   . Arthritis    "shoulders" (04/24/2017)  . Atrial fibrillation with RVR ([place]) 03/2017   [name] 03/08/2017  . COPD (chronic obstructive pulmonary disease) ([place])   . Coronary artery disease   . Dementia   . Fall at home 06/17/2017   nasal fracture  . High cholesterol   . HOH (hard of hearing)   . Hypertension   . Pneumonia ~ 2003-2008 X 5   "5 straight years S/P pneumonia shot in ~ 2003"(04/24/2017)  . Skin cancer 2018   "cut it out of right arm"   Past Surgical History:  Procedure Laterality Date  . APPENDECTOMY    . CARDIAC CATHETERIZATION    . CATARACT EXTRACTION, BILATERAL Bilateral   . CORONARY ANGIOPLASTY WITH STENT PLACEMENT    . CORONARY ATHERECTOMY N/A 04/24/2017   Procedure: CORONARY ATHERECTOMY;  Surgeon: Adrian Prows, MD;  Location: [facility];  Service: Cardiovascular;  Laterality: N/A;  . CORONARY STENT INTERVENTION N/A 04/24/2017   Procedure: CORONARY STENT INTERVENTION;  Surgeon: Adrian Prows, MD;  Location: [facility];  Service: Cardiovascular;  Laterality: N/A;  . INCISION AND DRAINAGE  1950s   "RLE; got hurt in service; had to get a bunch of stuff out"  . LEFT HEART CATH AND CORONARY ANGIOGRAPHY N/A 03/19/2017   Procedure: LEFT HEART CATH AND CORONARY ANGIOGRAPHY;  Surgeon: Adrian Prows, MD;  Location: [facility];  Service: Cardiovascular;  Laterality: N/A;  . SKIN CANCER EXCISION Right 2018   "arms"  . TOOTH EXTRACTION      Medications: Prior  to Admission medications   Medication Sig Start Date End Date Taking? Authorizing Provider  acetaminophen (TYLENOL) 325 MG tablet Take 1-2 tablets (325-650 mg total) by mouth every 4 (four) hours as needed for mild pain. 07/09/17  Yes Love, Ivan Anchors, PA-C  amiodarone (PACERONE) 200 MG tablet Take 1 tablet (200 mg total) by mouth daily. 07/10/17  Yes Love, Ivan Anchors, PA-C  atorvastatin (LIPITOR) 10 MG tablet Take 1 tablet (10 mg total) by  mouth daily. 07/10/17  Yes Love, Ivan Anchors, PA-C  digoxin (LANOXIN) 0.125 MG tablet Take 1 tablet (0.125 mg total) by mouth daily. 07/10/17  Yes Love, Ivan Anchors, PA-C  diltiazem (CARDIZEM) 60 MG tablet Take 1 tablet (60 mg total) by mouth every 8 (eight) hours. 07/10/17  Yes Love, Ivan Anchors, PA-C  docusate sodium (COLACE) 100 MG capsule Take 1 capsule (100 mg total) by mouth 2 (two) times daily as needed for mild constipation. 07/10/17  Yes Love, Ivan Anchors, PA-C  Dutasteride-Tamsulosin HCl (JALYN) 0.5-0.4 MG CAPS Take 1 tablet by mouth daily. 07/10/17  Yes Love, Ivan Anchors, PA-C  megestrol (MEGACE) 400 MG/10ML suspension Take 10 mLs (400 mg total) by mouth 2 (two) times daily. 07/10/17  Yes Love, Ivan Anchors, PA-C  metoprolol tartrate (LOPRESSOR) 50 MG tablet Take 1 tablet (50 mg total) by mouth 2 (two) times daily. 06/25/17  Yes Sheikh, Omair Latif, DO  Multiple Vitamin (MULTIVITAMIN WITH MINERALS) TABS tablet Take 1 tablet by mouth daily. 07/10/17  Yes Love, Ivan Anchors, PA-C  QUEtiapine (SEROQUEL) 25 MG tablet Take 1 tablet (25 mg total) by mouth daily at 8 pm. 07/10/17  Yes Love, Ivan Anchors, PA-C  Rivaroxaban (XARELTO) 15 MG TABS tablet Take 1 tablet (15 mg total) by mouth daily with supper. Start this evening 07/10/17  Yes Love, Ivan Anchors, PA-C  feeding supplement, ENSURE ENLIVE, (ENSURE ENLIVE) LIQD Take 237 mLs by mouth 4 (four) times daily -  with meals and at bedtime. Patient not taking: Reported on 07/14/2017 07/09/17   Love, Ivan Anchors, PA-C  polyethylene glycol (MIRALAX / GLYCOLAX) packet Take 17 g by mouth daily. Patient not taking: Reported on 07/14/2017 06/26/17   Kerney Elbe, DO    Allergies:   Allergies  Allergen Reactions  . Phenergan [Promethazine Hcl] Other (See Comments)    Confusion     Social History:  reports that he has been smoking cigarettes.  He has a 6.50 pack-year smoking history. He has quit using smokeless tobacco. He reports that he does not drink alcohol or use drugs.  Family  History: Family History  Problem Relation Age of Onset  . Heart attack Father   . Cancer Sister   . Lung cancer Brother     Physical Exam: Vitals:   07/14/17 1521 07/14/17 2049  BP: 123/73 119/63  Pulse: 67 90  Resp: 18 14  Temp: 97.8 F (36.6 C)   TempSrc: Oral   SpO2: 96% 98%    General: Confused patient, well developed and nourished, no acute distress Eyes: PERRLA, pink conjunctiva, no scleral icterus ENT: Moist oral mucosa, neck supple, no thyromegaly Lungs: clear to ascultation, no wheeze, no crackles, no use of accessory muscles Cardiovascular: regular rate and rhythm, no regurgitation, no gallops, no murmurs. No carotid bruits, no JVD Abdomen: soft, positive BS, non-tender, non-distended, no organomegaly, not an acute abdomen GU: Foley in place, gross hematuria Neuro: CN II - XII grossly intact, sensation intact Musculoskeletal: strength 5/5 all extremities, no clubbing, cyanosis or edema Skin: no  rash, no subcutaneous crepitation, no decubitus Psych: Confused patient   Labs on Admission:  Recent Labs    07/14/17 1744  NA 138  K 4.1  CL 104  CO2 27  GLUCOSE 133*  BUN 18  CREATININE 0.89  CALCIUM 8.2*   Recent Labs    07/14/17 1744  AST 24  ALT 26  ALKPHOS 81  BILITOT 0.7  PROT 6.4*  ALBUMIN 2.3*   No results for input(s): LIPASE, AMYLASE in the last 72 hours. Recent Labs    07/14/17 1744  WBC 13.2*  NEUTROABS 10.5*  HGB 10.6*  HCT 33.0*  MCV 93.8  PLT 188   No results for input(s): CKTOTAL, CKMB, CKMBINDEX, TROPONINI in the last 72 hours. Invalid input(s): POCBNP No results for input(s): DDIMER in the last 72 hours. No results for input(s): HGBA1C in the last 72 hours. No results for input(s): CHOL, HDL, LDLCALC, TRIG, CHOLHDL, LDLDIRECT in the last 72 hours. No results for input(s): TSH, T4TOTAL, T3FREE, THYROIDAB in the last 72 hours.  Invalid input(s): FREET3 No results for input(s): VITAMINB12, FOLATE, FERRITIN, TIBC, IRON,  RETICCTPCT in the last 72 hours.  Micro Results: Recent Results (from the past 240 hour(s))  Culture, Urine     Status: None   Collection Time: 07/06/17  8:32 PM  Result Value Ref Range Status   Specimen Description URINE, CATHETERIZED  Final   Special Requests NONE  Final   Culture   Final    NO GROWTH Performed at [facility], [address] [place], [place] [address]    Report Status 07/08/2017 FINAL  Final     Radiological Exams on Admission: No results found.  Assessment/Plan Present on Admission: . Gross hematuria -Bring for 23 hour observation on med telemetry -Urologist Dr. Karsten Ro has seen the patient in ER and will follow in the a.m. -Continue Flomax -Xarelto has been held for tonight -irrigate foley as needed, urine light red with no clot  . Acute blood loss anemia -h/h q8 ordered  . Atrial fibrillation ([place]) -monitor on tele. meds continued except anticoag  . CAD (coronary artery disease), native coronary artery -stable, home meds resumeds -consult cards re anticoag and possibility of discontinuation  . Chronic obstructive pulmonary disease (HCC) -stable, resume home meds   . Dementia -aware  Ravenne Wayment 07/15/2017, 12:51 AM
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
## 2017-07-15 NOTE — Progress Notes (Signed)
I recommend ASA 81 mg daily and hold Xarelto for now. I will consider restarting this when stable as OP Adrian Prows.  [phone]

## 2017-07-16 DIAGNOSIS — F0280 Dementia in other diseases classified elsewhere without behavioral disturbance: Secondary | ICD-10-CM

## 2017-07-16 LAB — CBC
HCT: 29.9 % — ABNORMAL LOW (ref 39.0–52.0)
Hemoglobin: 9.7 g/dL — ABNORMAL LOW (ref 13.0–17.0)
MCH: 30.3 pg (ref 26.0–34.0)
MCHC: 32.4 g/dL (ref 30.0–36.0)
MCV: 93.4 fL (ref 78.0–100.0)
Platelets: 161 K/uL (ref 150–400)
RBC: 3.2 MIL/uL — ABNORMAL LOW (ref 4.22–5.81)
RDW: 15.2 % (ref 11.5–15.5)
WBC: 8.5 K/uL (ref 4.0–10.5)

## 2017-07-16 LAB — BASIC METABOLIC PANEL WITH GFR
BUN: 19 mg/dL (ref 6–20)
CO2: 23 mmol/L (ref 22–32)
Calcium: 8 mg/dL — ABNORMAL LOW (ref 8.9–10.3)
Chloride: 104 mmol/L (ref 101–111)
Creatinine, Ser: 0.93 mg/dL (ref 0.61–1.24)
GFR calc Af Amer: >60 mL/min (ref >60)
Sodium: 139 mmol/L (ref 135–145)

## 2017-07-16 LAB — HEMOGLOBIN AND HEMATOCRIT, BLOOD
HCT: 25.2 % — ABNORMAL LOW (ref 39.0–52.0)
HEMATOCRIT: 28.1 % — AB (ref 39.0–52.0)
HEMOGLOBIN: 9.1 g/dL — AB (ref 13.0–17.0)
Hemoglobin: 8.1 g/dL — ABNORMAL LOW (ref 13.0–17.0)

## 2017-07-16 LAB — BASIC METABOLIC PANEL
Anion gap: 12 (ref 5–15)
GFR calc non Af Amer: >60 mL/min (ref >60)
Glucose, Bld: 141 mg/dL — ABNORMAL HIGH (ref 65–99)
Potassium: 3.8 mmol/L (ref 3.5–5.1)

## 2017-07-16 LAB — URINE CULTURE: CULTURE: NO GROWTH

## 2017-07-16 LAB — MAGNESIUM: Magnesium: 2 mg/dL (ref 1.7–2.4)

## 2017-07-16 MED ORDER — POLYETHYLENE GLYCOL 3350 17 G PO PACK
17.0000 g | PACK | Freq: Every day | ORAL | Status: DC
  Administered 2017-07-16 – 2017-07-18 (×3): 17 g via ORAL
  Filled 2017-07-16 (×3): qty 1

## 2017-07-16 MED ORDER — QUETIAPINE FUMARATE 25 MG PO TABS
25.0000 mg | ORAL_TABLET | Freq: Every day | ORAL | Status: DC
  Administered 2017-07-16 – 2017-07-17 (×2): 25 mg via ORAL
  Filled 2017-07-16 (×2): qty 1

## 2017-07-16 MED ORDER — SODIUM CHLORIDE 0.9 % IV SOLN
1.0000 g | INTRAVENOUS | Status: DC
  Administered 2017-07-16 – 2017-07-17 (×2): 1 g via INTRAVENOUS
  Filled 2017-07-16: qty 1
  Filled 2017-07-16: qty 10
  Filled 2017-07-16: qty 1

## 2017-07-16 MED ORDER — HALOPERIDOL LACTATE 5 MG/ML IJ SOLN
2.0000 mg | Freq: Once | INTRAMUSCULAR | Status: AC
  Administered 2017-07-16: 2 mg via INTRAVENOUS
  Filled 2017-07-16: qty 1

## 2017-07-16 NOTE — Progress Notes (Addendum)
PROGRESS NOTE                                                                                                                                                                                                             Patient Demographics:    Jeremy Macdonald, is a 84 y.o. male, DOB - [birth year], YFV:[id number]  Admit date - 07/14/2017   Admitting Physician Quintella Baton, MD  Outpatient Primary MD for the patient is Vernie Shanks, MD  LOS - 1  Outpatient Specialists: urology  Chief Complaint  Patient presents with  . Hematuria       Brief Narrative   84 year old male with history of coronary artery disease status post PCI on 04/24/2017, paroxysmal A. 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  This was likely in the setting of patient pulling on his Foley.  Urology consulted and patient getting continuous bladder irrigation.   Subjective:   Per nurse the coud catheter came out overnight and was placed by urology this morning??? ( dont see anything documented).  Has not had a bowel movement.  Denies any pain.   Assessment  & Plan :   Principal problem Gross hematuria with chronic indwelling Foley Secondary to patient frequently pulling on his Foley with confusion.  Patient was taken off Plavix 3 months after PCI due to increased risk of bleeding.  Xarelto held. Neurology consult appreciated.  Bedside cystoscopy on 2/10 showing only gross blood.  A 20 French coud tip three-way catheter placed followed by continuous bladder irrigation.  Now has clear urine on the back.  H&H low but stable. -Continue dutasteride and Flomax.  Further recommendations per urology.  Abdominal pain No further symptoms.  Possibly due to constipation.  CT abdomen without any obstruction.  On scheduled MiraLAX.   Lobar  pneumonia Incidental finding on CT abdomen, likely contributing to abdominal pain.  Continue Rocephin and azithromycin.  Paroxysmal A. fib Xarelto held for now due to hematuria.  Cardiology consult appreciated and recommend to continue baby aspirin, amiodarone, Cardizem, metoprolol and digoxin.  Patient will follow up with his cardiologist as outpatient will decide on continuing anticoagulation.  (With his increased fall risk recently he may need to be off anticoagulation completely).  Coronary artery disease status  post PCI On baby aspirin, statin and metoprolol.  Essential hypertension Stable.  Continue metoprolol and Cardizem.  Progressive dementia As per wife patient has been getting confused with multiple falls after being discharged from rehab recently.  Wife is hesitant to take him home and feels she will not be able to lift him or assist him and moving around.  She is interested in SNF.  Social work consulted. Continue Seroquel daily at bedtime.     Code Status : DNR  Family Communication  : Wife at bedside  Disposition Plan  : SNF possibly in 1-2 days if no further hematuria  Barriers For Discharge : Improving symptoms  Consults  : Urology Cardiology (Dr Einar Gip)  Procedures  : CT abdomen  DVT Prophylaxis  : SCDs  Lab Results  Component Value Date   PLT 161 07/16/2017    Antibiotics  :    Anti-infectives (From admission, onward)   Start     Dose/Rate Route Frequency Ordered Stop   07/16/17 2200  cefTRIAXone (ROCEPHIN) 1 g in sodium chloride 0.9 % 100 mL IVPB     1 g 200 mL/hr over 30 Minutes Intravenous Every 24 hours 07/16/17 0853     07/15/17 2200  cefTRIAXone (ROCEPHIN) 1 g in dextrose 5 % 50 mL IVPB  Status:  Discontinued     1 g 100 mL/hr over 30 Minutes Intravenous Every 24 hours 07/15/17 1509 07/16/17 0853   07/15/17 1515  azithromycin (ZITHROMAX) tablet 500 mg     500 mg Oral Daily 07/15/17 1509     07/14/17 2015  cefTRIAXone (ROCEPHIN) 1 g in  dextrose 5 % 50 mL IVPB     1 g 100 mL/hr over 30 Minutes Intravenous  Once 07/14/17 2011 07/14/17 2204        Objective:   Vitals:   07/15/17 1010 07/15/17 1419 07/15/17 2009 07/16/17 0540  BP: (!) 153/91 116/63 111/64 135/72  Pulse: (!) 104 75 86 76  Resp:  20 20 18   Temp:  97.6 F (36.4 C) 98.1 F (36.7 C) 98.7 F (37.1 C)  TempSrc:  Oral Oral Oral  SpO2:  97% 92% 92%  Weight:    72.7 kg (160 lb 4.8 oz)  Height:        Wt Readings from Last 3 Encounters:  07/16/17 72.7 kg (160 lb 4.8 oz)  07/10/17 72.4 kg (159 lb 9.8 oz)  06/25/17 78.3 kg (172 lb 9.6 oz)     Intake/Output Summary (Last 24 hours) at 07/16/2017 0958 Last data filed at 07/16/2017 0600 Gross per 24 hour  Intake 14790 ml  Output 16200 ml  Net -1410 ml     Physical Exam  Gen: not in distress HEENT: Pallor present, moist mucosa, supple neck Chest: clear b/l, no added sounds CVS: S1 and S2 irregular, no murmurs GI: soft, NT, ND, BS+, coud catheter with bladder irrigation, draining clear urine Musculoskeletal: warm, no edema CNS: AAOX2, (oriented to place and person), nonfocal    Data Review:    CBC Recent Labs  Lab 07/10/17 0626 07/14/17 1744 07/15/17 0234 07/15/17 0957 07/15/17 1754 07/16/17 0152  WBC 9.0 13.2*  --   --   --  8.5  HGB 10.3* 10.6* 9.8* 10.9* 8.8* 9.7*  HCT 34.0* 33.0* 30.6* 34.2* 27.3* 29.9*  PLT 233 188  --   --   --  161  MCV 96.0 93.8  --   --   --  93.4  MCH 29.1 30.1  --   --   --  30.3  MCHC 30.3 32.1  --   --   --  32.4  RDW 14.5 15.3  --   --   --  15.2  LYMPHSABS  --  1.5  --   --   --   --   MONOABS  --  1.3*  --   --   --   --   EOSABS  --  0.0  --   --   --   --   BASOSABS  --  0.0  --   --   --   --     Chemistries  Recent Labs  Lab 07/10/17 0626 07/14/17 1744 07/15/17 0234 07/16/17 0152  NA 139 138 137 139  K 4.1 4.1 4.0 3.8  CL 100* 104 106 104  CO2 29 27 25 23   GLUCOSE 122* 133* 112* 141*  BUN 13 18 16 19   CREATININE 0.81 0.89 0.77  0.93  CALCIUM 8.3* 8.2* 7.6* 8.0*  MG  --   --  1.6* 2.0  AST  --  24  --   --   ALT  --  26  --   --   ALKPHOS  --  81  --   --   BILITOT  --  0.7  --   --    ------------------------------------------------------------------------------------------------------------------ No results for input(s): CHOL, HDL, LDLCALC, TRIG, CHOLHDL, LDLDIRECT in the last 72 hours.  No results found for: HGBA1C ------------------------------------------------------------------------------------------------------------------ No results for input(s): TSH, T4TOTAL, T3FREE, THYROIDAB in the last 72 hours.  Invalid input(s): FREET3 ------------------------------------------------------------------------------------------------------------------ No results for input(s): VITAMINB12, FOLATE, FERRITIN, TIBC, IRON, RETICCTPCT in the last 72 hours.  Coagulation profile Recent Labs  Lab 07/15/17 0234  INR 1.52    No results for input(s): DDIMER in the last 72 hours.  Cardiac Enzymes No results for input(s): CKMB, TROPONINI, MYOGLOBIN in the last 168 hours.  Invalid input(s): CK ------------------------------------------------------------------------------------------------------------------    Component Value Date/Time   BNP 806.8 (H) 06/20/2017 1033    Inpatient Medications  Scheduled Meds: . amiodarone  200 mg Oral Daily  . atorvastatin  10 mg Oral q1800  . azithromycin  500 mg Oral Q1500  . digoxin  0.125 mg Oral Daily  . diltiazem  60 mg Oral Q8H  . dutasteride  0.5 mg Oral Daily   And  . tamsulosin  0.4 mg Oral Daily  . enoxaparin (LOVENOX) injection  40 mg Subcutaneous Q24H  . magnesium citrate  1 Bottle Oral Once  . megestrol  400 mg Oral BID  . metoprolol tartrate  50 mg Oral BID  . multivitamin with minerals  1 tablet Oral Daily  . polyethylene glycol  17 g Oral Daily  . QUEtiapine  25 mg Oral Q2000   Continuous Infusions: . cefTRIAXone (ROCEPHIN)  IV     PRN  Meds:.acetaminophen **OR** acetaminophen, docusate sodium, haloperidol lactate, morphine injection, ondansetron **OR** ondansetron (ZOFRAN) IV, opium-belladonna  Micro Results Recent Results (from the past 240 hour(s))  Culture, Urine     Status: None   Collection Time: 07/06/17  8:32 PM  Result Value Ref Range Status   Specimen Description URINE, CATHETERIZED  Final   Special Requests NONE  Final   Culture   Final    NO GROWTH Performed at [facility], [place] [address], [place], [place] [address]    Report Status 07/08/2017 FINAL  Final  Urine culture     Status: None (Preliminary result)   Collection Time: 07/14/17  8:48 PM  Result Value Ref Range Status   Specimen Description   Final    URINE, RANDOM Performed at [facility] [address] [place], [place] [address]    Special Requests NONE  Final   Culture PENDING  Incomplete   Report Status PENDING  Incomplete    Radiology Reports Dg Chest 1 View  Result Date: 06/20/2017 CLINICAL DATA:  Pain following fall EXAM: CHEST 1 VIEW COMPARISON:  March 08, 2017 FINDINGS: There is scarring in the right lower lobe region. There is no edema or consolidation. Heart is upper normal in size with pulmonary vascularity within normal limits. There is aortic atherosclerosis. No adenopathy. No pneumothorax. No bone lesions. IMPRESSION: Scarring right lower lobe. No edema or consolidation. Stable cardiac silhouette. There is aortic atherosclerosis. Aortic Atherosclerosis (ICD10-I70.0). Electronically Signed   By: Lowella Grip III M.D.   On: 06/20/2017 11:32   Dg Knee 1-2 Views Right  Result Date: 06/25/2017 CLINICAL DATA:  Pain when standing to right knee. EXAM: RIGHT KNEE - 1-2 VIEW COMPARISON:  06/20/2017 FINDINGS: Chondrocalcinosis in medial and lateral compartments. Early marginal spur formation about all 3 compartments. Normal alignment. There is a small effusion in the suprapatellar bursa. Negative for  fracture. Extensive popliteal arterial calcifications. IMPRESSION: 1. Negative for fracture or other acute bone abnormality. 2. Chondrocalcinosis with early tricompartmental degenerative change and effusion, suggesting CPPD arthropathy. Electronically Signed   By: Lucrezia Europe M.D.   On: 06/25/2017 19:03   Ct Head Wo Contrast  Result Date: 06/20/2017 CLINICAL DATA:  Dizziness and pain following recent fall EXAM: CT HEAD WITHOUT CONTRAST CT CERVICAL SPINE WITHOUT CONTRAST TECHNIQUE: Multidetector CT imaging of the head and cervical spine was performed following the standard protocol without intravenous contrast. Multiplanar CT image reconstructions of the cervical spine were also generated. COMPARISON:  CT head and CT cervical spine June 17, 2017 FINDINGS: CT HEAD FINDINGS Brain: There is persistent diffuse cerebral atrophy. There is no demonstrable mass, hemorrhage, extra-axial fluid collection, or midline shift. Widespread small vessel disease throughout the centra semiovale is stable. Small vessel disease is noted in each external capsule anteriorly. No acute infarct appreciable. Vascular: No hyperdense vessel. There is calcification in each carotid siphon and distal vertebral artery region. Skull: Bony calvarium appears intact. Fractures of the anterior left and right nasal bones remain stable compared to recent CT. No new fracture evident. Sinuses/Orbits: There is mucosal thickening in several ethmoid air cells bilaterally. There is a retention cyst in the inferior right maxillary antrum. There is a small retention cyst in the anterior left maxillary antrum. Orbits appear symmetric bilaterally. Other: Mastoid air cells are clear. CT CERVICAL SPINE FINDINGS Alignment: There is stable 2 mm retrolisthesis of C3 on C4. No new spondylolisthesis. Skull base and vertebrae: Skull base and craniocervical junction regions appear normal. There is mild pannus posterior to the odontoid which is not causing impression on  the craniocervical junction. There is no demonstrable fracture. No blastic or lytic bone lesions. Soft tissues and spinal canal: Prevertebral soft tissues and predental space regions are normal. No paraspinous lesions are evident. There is no cord or canal hematoma. Disc levels: There is moderately severe disc space narrowing at C3-4. There is mild disc space narrowing at C5-6. There is facet hypertrophy at several levels without appreciable nerve root edema or effacement. No disc extrusion or stenosis. Upper chest: There is mild scarring in the lung apices. No upper lung zone edema or consolidation. Other: There is atherosclerotic calcification in both subclavian  arteries as well as in both carotid and vertebral arteries. Calcification appears fairly extensive in the extracranial carotid arteries. IMPRESSION: CT head: Diffuse atrophy with extensive supratentorial small vessel disease, unchanged compared to recent study. No mass, hemorrhage, or extra-axial fluid collection. Foci of arterial vascular calcification noted. Nasal bone fracture stable compared to recent study. Areas of paranasal sinus disease noted. CT cervical spine: No fracture. Mild spondylolisthesis at C3-4 is felt to be due to underlying spondylosis. There is osteoarthritic changes several levels. There is extensive vascular calcification, most notably in the extracranial carotid arteries bilaterally. Electronically Signed   By: Lowella Grip III M.D.   On: 06/20/2017 11:04   Ct Head Wo Contrast  Result Date: 06/17/2017 CLINICAL DATA:  [place] today and struck head and face. EXAM: CT HEAD WITHOUT CONTRAST CT MAXILLOFACIAL WITHOUT CONTRAST CT CERVICAL SPINE WITHOUT CONTRAST TECHNIQUE: Multidetector CT imaging of the head, cervical spine, and maxillofacial structures were performed using the standard protocol without intravenous contrast. Multiplanar CT image reconstructions of the cervical spine and maxillofacial structures were also generated.  COMPARISON:  08/15/2009 head CT FINDINGS: CT HEAD FINDINGS Brain: Progressive age related cerebral atrophy, ventriculomegaly and periventricular white matter disease. No extra-axial fluid collections are identified. No CT findings for acute hemispheric infarction or intracranial hemorrhage. No mass lesions. The brainstem and cerebellum are normal. Vascular: No hyperdense vessels or aneurysm. Stable vascular calcifications. Skull: No acute skull fracture or bone lesion. Other: No scalp laceration or hematoma. CT MAXILLOFACIAL FINDINGS Osseous: Small fractures involving the left nasal bone and bony is a bridge. There also multiple lacerations and radiopaque foreign bodies in the soft tissues overlying the nose. The bony nasal septum is intact. No other acute facial bone fractures are identified. The mandibular condyles are normally located. No mandible fracture. Torus mandibularis noted incidentally. Orbits: The orbital bones are intact.  The globes are intact. Sinuses: Mucous retention cysts or possible polyps in the maxillary sinuses. The other paranasal sinuses are clear and the mastoid air cells and middle ear cavities are clear. Soft tissues: Soft tissue swelling and lacerations involving the nose. CT CERVICAL SPINE FINDINGS Alignment: Normal Skull base and vertebrae: No acute fracture. Soft tissues and spinal canal: No prevertebral fluid or swelling. No visible canal hematoma. Disc levels: No significant disc protrusions, spinal or foraminal stenosis. Upper chest: The lung apices are grossly clear. Other: Extensive carotid artery calcifications. IMPRESSION: 1. Progressive cerebral atrophy, ventriculomegaly and periventricular white matter disease. 2. No new/acute intracranial findings or skull fracture. 3. Small nasal bone fractures and multiple lacerations and small radiopaque foreign bodies involving the soft tissues of the nose. 4. No acute cervical spine fracture. Electronically Signed   By: Marijo Sanes  M.D.   On: 06/17/2017 18:53   Ct Cervical Spine Wo Contrast  Result Date: 06/20/2017 CLINICAL DATA:  Dizziness and pain following recent fall EXAM: CT HEAD WITHOUT CONTRAST CT CERVICAL SPINE WITHOUT CONTRAST TECHNIQUE: Multidetector CT imaging of the head and cervical spine was performed following the standard protocol without intravenous contrast. Multiplanar CT image reconstructions of the cervical spine were also generated. COMPARISON:  CT head and CT cervical spine June 17, 2017 FINDINGS: CT HEAD FINDINGS Brain: There is persistent diffuse cerebral atrophy. There is no demonstrable mass, hemorrhage, extra-axial fluid collection, or midline shift. Widespread small vessel disease throughout the centra semiovale is stable. Small vessel disease is noted in each external capsule anteriorly. No acute infarct appreciable. Vascular: No hyperdense vessel. There is calcification in each carotid siphon and distal  vertebral artery region. Skull: Bony calvarium appears intact. Fractures of the anterior left and right nasal bones remain stable compared to recent CT. No new fracture evident. Sinuses/Orbits: There is mucosal thickening in several ethmoid air cells bilaterally. There is a retention cyst in the inferior right maxillary antrum. There is a small retention cyst in the anterior left maxillary antrum. Orbits appear symmetric bilaterally. Other: Mastoid air cells are clear. CT CERVICAL SPINE FINDINGS Alignment: There is stable 2 mm retrolisthesis of C3 on C4. No new spondylolisthesis. Skull base and vertebrae: Skull base and craniocervical junction regions appear normal. There is mild pannus posterior to the odontoid which is not causing impression on the craniocervical junction. There is no demonstrable fracture. No blastic or lytic bone lesions. Soft tissues and spinal canal: Prevertebral soft tissues and predental space regions are normal. No paraspinous lesions are evident. There is no cord or canal  hematoma. Disc levels: There is moderately severe disc space narrowing at C3-4. There is mild disc space narrowing at C5-6. There is facet hypertrophy at several levels without appreciable nerve root edema or effacement. No disc extrusion or stenosis. Upper chest: There is mild scarring in the lung apices. No upper lung zone edema or consolidation. Other: There is atherosclerotic calcification in both subclavian arteries as well as in both carotid and vertebral arteries. Calcification appears fairly extensive in the extracranial carotid arteries. IMPRESSION: CT head: Diffuse atrophy with extensive supratentorial small vessel disease, unchanged compared to recent study. No mass, hemorrhage, or extra-axial fluid collection. Foci of arterial vascular calcification noted. Nasal bone fracture stable compared to recent study. Areas of paranasal sinus disease noted. CT cervical spine: No fracture. Mild spondylolisthesis at C3-4 is felt to be due to underlying spondylosis. There is osteoarthritic changes several levels. There is extensive vascular calcification, most notably in the extracranial carotid arteries bilaterally. Electronically Signed   By: Lowella Grip III M.D.   On: 06/20/2017 11:04   Ct Cervical Spine Wo Contrast  Result Date: 06/17/2017 CLINICAL DATA:  [place] today and struck head and face. EXAM: CT HEAD WITHOUT CONTRAST CT MAXILLOFACIAL WITHOUT CONTRAST CT CERVICAL SPINE WITHOUT CONTRAST TECHNIQUE: Multidetector CT imaging of the head, cervical spine, and maxillofacial structures were performed using the standard protocol without intravenous contrast. Multiplanar CT image reconstructions of the cervical spine and maxillofacial structures were also generated. COMPARISON:  08/15/2009 head CT FINDINGS: CT HEAD FINDINGS Brain: Progressive age related cerebral atrophy, ventriculomegaly and periventricular white matter disease. No extra-axial fluid collections are identified. No CT findings for acute  hemispheric infarction or intracranial hemorrhage. No mass lesions. The brainstem and cerebellum are normal. Vascular: No hyperdense vessels or aneurysm. Stable vascular calcifications. Skull: No acute skull fracture or bone lesion. Other: No scalp laceration or hematoma. CT MAXILLOFACIAL FINDINGS Osseous: Small fractures involving the left nasal bone and bony is a bridge. There also multiple lacerations and radiopaque foreign bodies in the soft tissues overlying the nose. The bony nasal septum is intact. No other acute facial bone fractures are identified. The mandibular condyles are normally located. No mandible fracture. Torus mandibularis noted incidentally. Orbits: The orbital bones are intact.  The globes are intact. Sinuses: Mucous retention cysts or possible polyps in the maxillary sinuses. The other paranasal sinuses are clear and the mastoid air cells and middle ear cavities are clear. Soft tissues: Soft tissue swelling and lacerations involving the nose. CT CERVICAL SPINE FINDINGS Alignment: Normal Skull base and vertebrae: No acute fracture. Soft tissues and spinal canal: No prevertebral fluid  or swelling. No visible canal hematoma. Disc levels: No significant disc protrusions, spinal or foraminal stenosis. Upper chest: The lung apices are grossly clear. Other: Extensive carotid artery calcifications. IMPRESSION: 1. Progressive cerebral atrophy, ventriculomegaly and periventricular white matter disease. 2. No new/acute intracranial findings or skull fracture. 3. Small nasal bone fractures and multiple lacerations and small radiopaque foreign bodies involving the soft tissues of the nose. 4. No acute cervical spine fracture. Electronically Signed   By: Marijo Sanes M.D.   On: 06/17/2017 18:53   Ct Abdomen Pelvis W Contrast  Result Date: 07/15/2017 CLINICAL DATA:  Abdominal distention EXAM: CT ABDOMEN AND PELVIS WITH CONTRAST TECHNIQUE: Multidetector CT imaging of the abdomen and pelvis was performed  using the standard protocol following bolus administration of intravenous contrast. CONTRAST:  174mL ISOVUE-300 IOPAMIDOL (ISOVUE-300) INJECTION 61% COMPARISON:  Plain films earlier today.  CT 11/29/2016 FINDINGS: Lower chest: Small right pleural effusion. Dependent atelectasis in the lower lobes. Nodular appearing density in the right lower lobe on image 7 measures up to 16 mm. This could reflect early infiltrate/pneumonia. This was not present on prior CT abdomen. Hepatobiliary: Scattered low densities in the liver compatible with cysts. Gallbladder unremarkable. No biliary ductal dilatation. Pancreas: No focal abnormality or ductal dilatation. Spleen: No focal abnormality.  Normal size. Adrenals/Urinary Tract: No adrenal mass. No hydronephrosis. Small cysts in the kidneys bilaterally. Foley catheter in the bladder which is decompressed. Stomach/Bowel: Left colonic diverticulosis. No active diverticulitis. Upper abdominal small bowel loops are mildly prominent, but there is no focal transition point. I favor this represents normal bowel gas pattern or slight ileus. Stomach is decompressed, unremarkable. Vascular/Lymphatic: Infrarenal abdominal aortic aneurysm, 4.7 cm maximally, stable since prior study. Extensive mural plaque. 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 No adenopathy. Reproductive: Marked prostate enlargement. Other: No free fluid or free air. Musculoskeletal: No acute bony abnormality. IMPRESSION: No evidence of bowel obstruction. 4.7 cm infrarenal abdominal aortic aneurysm. 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 Findings are stable. Left colonic diverticulosis.  No active diverticulitis. Marked prostate enlargement. Small right pleural effusion. Nodular airspace opacity in the right lower lobe could reflect early infiltrate/pneumonia. Recommend follow-up CT after treatment to exclude pulmonary nodule. Electronically Signed   By: Rolm Baptise M.D.   On: 07/15/2017  13:31   US Renal  Result Date: 06/21/2017 CLINICAL DATA:  Oliguria. EXAM: RENAL / URINARY TRACT ULTRASOUND COMPLETE COMPARISON:  None. FINDINGS: Right Kidney: Length: 9.8 cm. Echogenicity is within normal limits. No mass or hydronephrosis visualized. Left Kidney: Length: 10.9 cm. Echogenicity within normal limits. No hydronephrosis visualized. 2.5 cm simple cyst is seen in lower pole. Bladder: Decompressed secondary to Foley catheter. Severely enlarged prostate gland is again noted, measuring 6.8 x 6.2 x 4.9 cm. IMPRESSION: No hydronephrosis or renal obstruction is noted. Severely enlarged prostate gland is again noted as described on prior CT scan. Electronically Signed   By: Marijo Conception, M.D.   On: 06/21/2017 09:24   Dg Knee Complete 4 Views Left  Result Date: 06/20/2017 CLINICAL DATA:  Fall.  Anterior knee pain, left greater than right. EXAM: LEFT KNEE - COMPLETE 4+ VIEW; RIGHT KNEE - COMPLETE 4+ VIEW COMPARISON:  None. FINDINGS: Mild degenerative changes are most evident in the medial and femoral compartments bilaterally. A small right-sided joint effusion is present. Chondrocalcinosis is present in the menisci bilaterally. Vascular calcifications are present. The joints are located bilaterally. No acute osseous abnormalities are present. IMPRESSION: 1. Bilateral chondrocalcinosis compatible with  CPPD arthropathy. 2. Mild osseous degenerative changes in the medial and patellofemoral compartments bilaterally. 3. No acute osseous abnormality. 4. Extensive vascular calcifications.  Atherosclerosis. Electronically Signed   By: San Morelle M.D.   On: 06/20/2017 11:34   Dg Knee Complete 4 Views Right  Result Date: 06/20/2017 CLINICAL DATA:  Fall.  Anterior knee pain, left greater than right. EXAM: LEFT KNEE - COMPLETE 4+ VIEW; RIGHT KNEE - COMPLETE 4+ VIEW COMPARISON:  None. FINDINGS: Mild degenerative changes are most evident in the medial and femoral compartments bilaterally. A small  right-sided joint effusion is present. Chondrocalcinosis is present in the menisci bilaterally. Vascular calcifications are present. The joints are located bilaterally. No acute osseous abnormalities are present. IMPRESSION: 1. Bilateral chondrocalcinosis compatible with CPPD arthropathy. 2. Mild osseous degenerative changes in the medial and patellofemoral compartments bilaterally. 3. No acute osseous abnormality. 4. Extensive vascular calcifications.  Atherosclerosis. Electronically Signed   By: San Morelle M.D.   On: 06/20/2017 11:34   Dg Abd 2 Views  Result Date: 07/15/2017 CLINICAL DATA:  Abdominal distention EXAM: ABDOMEN - 2 VIEW COMPARISON:  None. FINDINGS: Mild gaseous distention of small bowel loops. No free air organomegaly. No suspicious calcification. Vascular calcifications noted. IMPRESSION: Mild gaseous distention of small bowel loops. This could reflect early small bowel obstruction or mild ileus. Electronically Signed   By: Rolm Baptise M.D.   On: 07/15/2017 10:50   Dg Hand Complete Right  Result Date: 06/17/2017 CLINICAL DATA:  Posterior right hand laceration following a fall on concrete today. EXAM: RIGHT HAND - COMPLETE 3+ VIEW COMPARISON:  Right wrist dated 06/08/2017 and right hand dated 05/26/2013. FINDINGS: Degenerative changes at the articulation of the trapezium and trapezoid with the scaphoid. No fracture or dislocation seen. IMPRESSION: 1. No fracture. 2. Stable degenerative changes at the articulation of the trapezium and trapezoid with the scaphoid. Electronically Signed   By: Claudie Revering M.D.   On: 06/17/2017 18:24   Ct Maxillofacial Wo Contrast  Result Date: 06/17/2017 CLINICAL DATA:  [place] today and struck head and face. EXAM: CT HEAD WITHOUT CONTRAST CT MAXILLOFACIAL WITHOUT CONTRAST CT CERVICAL SPINE WITHOUT CONTRAST TECHNIQUE: Multidetector CT imaging of the head, cervical spine, and maxillofacial structures were performed using the standard protocol without  intravenous contrast. Multiplanar CT image reconstructions of the cervical spine and maxillofacial structures were also generated. COMPARISON:  08/15/2009 head CT FINDINGS: CT HEAD FINDINGS Brain: Progressive age related cerebral atrophy, ventriculomegaly and periventricular white matter disease. No extra-axial fluid collections are identified. No CT findings for acute hemispheric infarction or intracranial hemorrhage. No mass lesions. The brainstem and cerebellum are normal. Vascular: No hyperdense vessels or aneurysm. Stable vascular calcifications. Skull: No acute skull fracture or bone lesion. Other: No scalp laceration or hematoma. CT MAXILLOFACIAL FINDINGS Osseous: Small fractures involving the left nasal bone and bony is a bridge. There also multiple lacerations and radiopaque foreign bodies in the soft tissues overlying the nose. The bony nasal septum is intact. No other acute facial bone fractures are identified. The mandibular condyles are normally located. No mandible fracture. Torus mandibularis noted incidentally. Orbits: The orbital bones are intact.  The globes are intact. Sinuses: Mucous retention cysts or possible polyps in the maxillary sinuses. The other paranasal sinuses are clear and the mastoid air cells and middle ear cavities are clear. Soft tissues: Soft tissue swelling and lacerations involving the nose. CT CERVICAL SPINE FINDINGS Alignment: Normal Skull base and vertebrae: No acute fracture. Soft tissues and spinal canal: No prevertebral  fluid or swelling. No visible canal hematoma. Disc levels: No significant disc protrusions, spinal or foraminal stenosis. Upper chest: The lung apices are grossly clear. Other: Extensive carotid artery calcifications. IMPRESSION: 1. Progressive cerebral atrophy, ventriculomegaly and periventricular white matter disease. 2. No new/acute intracranial findings or skull fracture. 3. Small nasal bone fractures and multiple lacerations and small radiopaque  foreign bodies involving the soft tissues of the nose. 4. No acute cervical spine fracture. Electronically Signed   By: Marijo Sanes M.D.   On: 06/17/2017 18:53    Time Spent in minutes  25   Ambyr Qadri M.D on 07/16/2017 at 9:58 AM  Between 7am to 7pm - Pager - [phone]  After 7pm go to [email or web address] - password [facility]  [facility] -  Office  [phone]

## 2017-07-16 NOTE — Progress Notes (Signed)
Patient restless, pulling tubes and getting out of bed, PRN 2mg  haldol given not effective, Dr. Clementeen Graham informed additional 2mg  of haldol ordered and given, will continue to assess patient.

## 2017-07-16 NOTE — Progress Notes (Signed)
Subjective: Urine is clear this morning off of CBI. Pt continues to be confused  Objective: Vital signs in last 24 hours: Temp:  [98.1 F (36.7 C)-98.7 F (37.1 C)] 98.1 F (36.7 C) (02/11 1322) Pulse Rate:  [65-86] 70 (02/11 1322) Resp:  [18-20] 20 (02/11 1322) BP: (111-135)/(59-72) 122/68 (02/11 1322) SpO2:  [92 %-93 %] 93 % (02/11 1322) Weight:  [72.7 kg (160 lb 4.8 oz)] 72.7 kg (160 lb 4.8 oz) (02/11 0540)  Intake/Output from previous day: 02/10 0701 - 02/11 0700 In: 14790 [P.O.:40; IV Piggyback:50] Out: 16200 [Urine:16200] Intake/Output this shift: Total I/O In: 600 [P.O.:600] Out: 725 [Urine:725]  Physical Exam:  General:distracted GI: soft, non tender, normal bowel sounds, no palpable masses, no organomegaly, no inguinal hernia Male genitalia: not done Extremities: extremities normal, atraumatic, no cyanosis or edema  Lab Results: Recent Labs    07/15/17 1754 07/16/17 0152 07/16/17 1000  HGB 8.8* 9.7* 9.1*  HCT 27.3* 29.9* 28.1*   BMET Recent Labs    07/15/17 0234 07/16/17 0152  NA 137 139  K 4.0 3.8  CL 106 104  CO2 25 23  GLUCOSE 112* 141*  BUN 16 19  CREATININE 0.77 0.93  CALCIUM 7.6* 8.0*   Recent Labs    07/15/17 0234  INR 1.52   No results for input(s): LABURIN in the last 72 hours. Results for orders placed or performed during the hospital encounter of 07/14/17  Urine culture     Status: None   Collection Time: 07/14/17  8:48 PM  Result Value Ref Range Status   Specimen Description   Final    URINE, RANDOM Performed at [facility] [address] [place], [place] [address]    Special Requests NONE  Final   Culture   Final    NO GROWTH Performed at [facility], [place] [address], [place], [place] [address]    Report Status 07/16/2017 FINAL  Final    Studies/Results: Ct Abdomen Pelvis W Contrast  Result Date: 07/15/2017 CLINICAL DATA:  Abdominal distention EXAM: CT ABDOMEN AND PELVIS WITH CONTRAST  TECHNIQUE: Multidetector CT imaging of the abdomen and pelvis was performed using the standard protocol following bolus administration of intravenous contrast. CONTRAST:  146mL ISOVUE-300 IOPAMIDOL (ISOVUE-300) INJECTION 61% COMPARISON:  Plain films earlier today.  CT 11/29/2016 FINDINGS: Lower chest: Small right pleural effusion. Dependent atelectasis in the lower lobes. Nodular appearing density in the right lower lobe on image 7 measures up to 16 mm. This could reflect early infiltrate/pneumonia. This was not present on prior CT abdomen. Hepatobiliary: Scattered low densities in the liver compatible with cysts. Gallbladder unremarkable. No biliary ductal dilatation. Pancreas: No focal abnormality or ductal dilatation. Spleen: No focal abnormality.  Normal size. Adrenals/Urinary Tract: No adrenal mass. No hydronephrosis. Small cysts in the kidneys bilaterally. Foley catheter in the bladder which is decompressed. Stomach/Bowel: Left colonic diverticulosis. No active diverticulitis. Upper abdominal small bowel loops are mildly prominent, but there is no focal transition point. I favor this represents normal bowel gas pattern or slight ileus. Stomach is decompressed, unremarkable. Vascular/Lymphatic: Infrarenal abdominal aortic aneurysm, 4.7 cm maximally, stable since prior study. Extensive mural plaque. 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 No adenopathy. Reproductive: Marked prostate enlargement. Other: No free fluid or free air. Musculoskeletal: No acute bony abnormality. IMPRESSION: No evidence of bowel obstruction. 4.7 cm infrarenal abdominal aortic aneurysm. 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 Findings are stable. Left colonic diverticulosis.  No active  diverticulitis. Marked prostate enlargement. Small right pleural effusion. Nodular airspace opacity in the right lower lobe could reflect early infiltrate/pneumonia. Recommend follow-up CT after treatment to exclude  pulmonary nodule. Electronically Signed   By: Rolm Baptise M.D.   On: 07/15/2017 13:31   Dg Abd 2 Views  Result Date: 07/15/2017 CLINICAL DATA:  Abdominal distention EXAM: ABDOMEN - 2 VIEW COMPARISON:  None. FINDINGS: Mild gaseous distention of small bowel loops. No free air organomegaly. No suspicious calcification. Vascular calcifications noted. IMPRESSION: Mild gaseous distention of small bowel loops. This could reflect early small bowel obstruction or mild ileus. Electronically Signed   By: Rolm Baptise M.D.   On: 07/15/2017 10:50    Assessment/Plan: 84yo with gross hematuria and urinary retention  1. Gross hematuria: The hematuria resolved and was likely related to foley trauma.  2. Urinary retention: Continue indwelling foley catheter. The patient will require monthly foley changes for management of his urinary retention   LOS: 1 day   Nicolette Bang 07/16/2017, 3:10 PM

## 2017-07-17 DIAGNOSIS — D62 Acute posthemorrhagic anemia: Secondary | ICD-10-CM

## 2017-07-17 DIAGNOSIS — F0150 Vascular dementia without behavioral disturbance: Secondary | ICD-10-CM

## 2017-07-17 DIAGNOSIS — R310 Gross hematuria: Secondary | ICD-10-CM

## 2017-07-17 DIAGNOSIS — I480 Paroxysmal atrial fibrillation: Secondary | ICD-10-CM

## 2017-07-17 LAB — HEMOGLOBIN AND HEMATOCRIT, BLOOD
HCT: 27 % — ABNORMAL LOW (ref 39.0–52.0)
HEMATOCRIT: 29.1 % — AB (ref 39.0–52.0)
Hemoglobin: 8.8 g/dL — ABNORMAL LOW (ref 13.0–17.0)
Hemoglobin: 9 g/dL — ABNORMAL LOW (ref 13.0–17.0)

## 2017-07-17 MED ORDER — HYDROCODONE-ACETAMINOPHEN 5-325 MG PO TABS: 1.0000 | ORAL_TABLET | Freq: Four times a day (QID) | ORAL | Status: DC | PRN

## 2017-07-17 MED ORDER — SENNOSIDES-DOCUSATE SODIUM 8.6-50 MG PO TABS
1.0000 | ORAL_TABLET | Freq: Two times a day (BID) | ORAL | Status: DC
  Administered 2017-07-17 – 2017-07-18 (×2): 1 via ORAL
  Filled 2017-07-17 (×2): qty 1

## 2017-07-17 MED ORDER — HALOPERIDOL LACTATE 5 MG/ML IJ SOLN
0.5000 mg | Freq: Four times a day (QID) | INTRAMUSCULAR | Status: DC | PRN
  Administered 2017-07-17 – 2017-07-18 (×2): 0.5 mg via INTRAVENOUS
  Filled 2017-07-17 (×2): qty 1

## 2017-07-17 NOTE — Progress Notes (Signed)
CMT alerted pt had 2.75 second pause, HR dropped to 30s. Pt asleep. HR now back to 50s-60s. On call provider made aware. Will continue to monitor closely.

## 2017-07-17 NOTE — Progress Notes (Signed)
Provided bed offers to pt's wife and son. [facility] selected.  Confirmed with admissions that there is bed availability for pt. Pt's son and wife going to visit this afternoon.  Plan: Transition to [facility] SNF at [place] for [facility] rehab.   Sharren Bridge, MSW, LCSW Clinical Social Work 07/17/2017 [phone]

## 2017-07-17 NOTE — Progress Notes (Signed)
Patient continue to be restless, getting out of bed and pulling tube, staff frequently in room to redirect patient, notified Dr Erlinda Hong and order given for haldol. Will continue to assess patient.

## 2017-07-17 NOTE — Clinical Social Work Note (Signed)
Clinical Social Work Assessment  Patient Details  Name: Jeremy Macdonald MRN: [id number] Date of Birth: 05/12/1926  Date of referral:  07/17/17               Reason for consult:  Facility Placement                Permission sought to share information with:  Family Supports, Chartered certified accountant granted to share information::  Yes, Verbal Permission Granted  Name::     wife [name]  Agency::  [facility] & other SNFs  Relationship::     Contact Information:     Housing/Transportation Living arrangements for the past 2 months:  Tax adviser of Information:  Scientist, water quality, Spouse Patient Interpreter Needed:  None Criminal Activity/Legal Involvement Pertinent to Current Situation/Hospitalization:  No - Comment as needed Significant Relationships:  Adult Children, Other Family Members, Spouse, Friend Lives with:  Spouse Do you feel safe going back to the place where you live?  Yes Need for family participation in patient care:  Yes (Comment)(wife involved in decision-making)  Care giving concerns:  Pt admitted from home where he resides with his wife. Wife reports prior to sustaining a fall about a month ago, pt was largely independent. They had downsized from their house to an apartment some time ago in order to better navigate their home without steps. Wife states pt recently went to [facility] (following that fall) and plan from there was to go to SNF, however after they had secured bed at [facility], they were unhappy that "belt would not be used there" and then planned to come home instead.  Pt currently somewhat confused and wife states this began to occur off and on since that fall.   Social Worker assessment / plan:  CSW consulted to assess potential SNF placement. Met with pt and wife at bedside. Pt sleeping, wife provided information for assessment. See above, pt recently went to [facility] and [facility]. Wife feels she will not be able to manage pt at home in  current condition. Aware that therapy evaluation has been consulted in order to assist with identifying care needs. Wife states that they are open to pursuing rehab at [facility] if recommended. States "We are on the list to move into [facility] so if they were able to take him for rehab that would be our first choice." CSW explained to wife that FL2/referrals will be made once level of therapy needed is known so that facilities can make informed decision on bed offers. Wife understanding.  Plan: Will refer to SNF when appropriate level of care known. Left voicemail with [facility] (both [place]) in order to investigate availability there.  Employment status:  Retired Forensic scientist:  Medicare PT Recommendations:  Not assessed at this time(pending) Information / Referral to community resources:  [place]  Patient/Family's Response to care:  Wife appreciative  Patient/Family's Understanding of and Emotional Response to Diagnosis, Current Treatment, and Prognosis:  Unable to gauge pt's understanding or emotional response. Sleeping and has been confused per reports. Wife demonstrates good understanding of treatment and potential plans. Emotionally seems supportive and well-adjusted- holding pt's hand during assessment and speaks about their family and life while smiling.   Emotional Assessment Appearance:  Appears stated age Attitude/Demeanor/Rapport:  (sleeping) Affect (typically observed):  Calm Orientation:  Oriented to Self, Oriented to Place Alcohol / Substance use:  Not Applicable Psych involvement (Current and /or in the community):  No (Comment)  Discharge  Needs  Concerns to be addressed:  Discharge Planning Concerns Readmission within the last 30 days:  Yes Current discharge risk:  (still assessing) Barriers to Discharge:  Continued Medical Work up   [employer], LCSW 07/17/2017, 9:50 AM  [phone]

## 2017-07-17 NOTE — NC FL2 (Signed)
[place] LEVEL OF CARE SCREENING TOOL     IDENTIFICATION  Patient Name: Jeremy Macdonald Birthdate: 11-04-1925 Sex: male Admission Date (Current Location): 07/14/2017  [facility] [employer] and [place] Number:  Herbalist and Address:  [facility],  [place], [place]      Provider Number: [id number]  Attending Physician Name and Address:  Florencia Reasons, MD  Relative Name and Phone Number:  Tiler Brandis, spouse, [phone]    Current Level of Care: Hospital Recommended Level of Care: [place] Prior Approval Number:    Date Approved/Denied:   PASRR Number: [id number]  Discharge Plan: SNF    Current Diagnoses: Patient Active Problem List   Diagnosis Date Noted  . Hematuria 07/15/2017  . Chronic anticoagulation 07/14/2017  . HTN (hypertension) 07/14/2017  . Pain   . Debility 06/25/2017  . Chronic obstructive pulmonary disease ([place])   . Bilateral hearing loss   . Leukocytosis   . Gross hematuria   . Acute blood loss anemia   . Hypoalbuminemia   . Malnutrition of moderate degree 06/24/2017  . Dementia 06/21/2017  . Hyperglycemia 06/21/2017  . BPH (benign prostatic hyperplasia) 06/21/2017  . Dizziness 06/21/2017  . Atrial fibrillation with rapid ventricular response ([place]) 06/20/2017  . Atrial fibrillation ([place]) 06/20/2017  . Post PTCA 04/24/2017  . CAD (coronary artery disease), native coronary artery 04/22/2017  . Angina pectoris ([place]) 04/22/2017  . PAF (paroxysmal atrial fibrillation) ([place]) 03/18/2017  . Abnormal nuclear stress test 03/18/2017    Orientation RESPIRATION BLADDER Height & Weight     Self, Place  Normal Indwelling catheter, Incontinent Weight: 158 lb 1.6 oz (71.7 kg) Height:  5\' 9"  (175.3 cm)  BEHAVIORAL SYMPTOMS/MOOD NEUROLOGICAL BOWEL NUTRITION STATUS      Continent Diet(heart healthy)  AMBULATORY STATUS COMMUNICATION OF NEEDS Skin    extensive assistance Verbally PU Stage and  Appropriate Care(stage II pressure wound coccyx, foam dressing changes)                       Personal Care Assistance Level of Assistance  Bathing, Dressing, Feeding  extensive Extensive Independent/supervision          Functional Limitations Info  Hearing Sight Info: Adequate Hearing Info: Impaired(hard of hearing) Speech Info: Adequate    SPECIAL CARE FACTORS FREQUENCY  PT (By licensed PT), OT (By licensed OT)     PT Frequency: 5x OT Frequency: 5x            Contractures Contractures Info: Not present    Additional Factors Info  Code Status, Allergies Code Status Info: DNR Allergies Info: phenergan           Current Medications (07/17/2017):  This is the current hospital active medication list Current Facility-Administered Medications  Medication Dose Route Frequency Provider Last Rate Last Dose  . acetaminophen (TYLENOL) tablet 650 mg  650 mg Oral Q6H PRN Crosley, Debby, MD       Or  . acetaminophen (TYLENOL) suppository 650 mg  650 mg Rectal Q6H PRN Crosley, Debby, MD      . amiodarone (PACERONE) tablet 200 mg  200 mg Oral Daily Crosley, Debby, MD   200 mg at 07/17/17 1008  . atorvastatin (LIPITOR) tablet 10 mg  10 mg Oral q1800 Quintella Baton, MD   10 mg at 07/16/17 1827  . azithromycin (ZITHROMAX) tablet 500 mg  500 mg Oral Q1500 Purohit, Shrey C, MD   500 mg at 07/16/17 1419  .  cefTRIAXone (ROCEPHIN) 1 g in sodium chloride 0.9 % 100 mL IVPB  1 g Intravenous Q24H Dhungel, Nishant, MD   Stopped at 07/16/17 2117  . digoxin (LANOXIN) tablet 0.125 mg  0.125 mg Oral Daily Crosley, Debby, MD   0.125 mg at 07/17/17 1007  . diltiazem (CARDIZEM) tablet 60 mg  60 mg Oral Q8H Crosley, Debby, MD   60 mg at 07/17/17 0539  . docusate sodium (COLACE) capsule 100 mg  100 mg Oral BID PRN Crosley, Debby, MD      . dutasteride (AVODART) capsule 0.5 mg  0.5 mg Oral Daily Crosley, Debby, MD   0.5 mg at 07/17/17 1008   And  . tamsulosin (FLOMAX) capsule 0.4 mg  0.4 mg  Oral Daily Crosley, Debby, MD   0.4 mg at 07/17/17 1007  . enoxaparin (LOVENOX) injection 40 mg  40 mg Subcutaneous Q24H Purohit, Shrey C, MD   40 mg at 07/17/17 1018  . magnesium citrate solution 1 Bottle  1 Bottle Oral Once Purohit, Shrey C, MD      . megestrol (MEGACE) 400 MG/10ML suspension 400 mg  400 mg Oral BID Claria Dice, Debby, MD   400 mg at 07/17/17 1009  . metoprolol tartrate (LOPRESSOR) tablet 50 mg  50 mg Oral BID Claria Dice, Debby, MD   50 mg at 07/17/17 1008  . morphine 4 MG/ML injection 2 mg  2 mg Intravenous Q4H PRN Purohit, Konrad Dolores, MD   2 mg at 07/17/17 0523  . multivitamin with minerals tablet 1 tablet  1 tablet Oral Daily Quintella Baton, MD   1 tablet at 07/17/17 1008  . ondansetron (ZOFRAN) tablet 4 mg  4 mg Oral Q6H PRN Crosley, Debby, MD       Or  . ondansetron (ZOFRAN) injection 4 mg  4 mg Intravenous Q6H PRN Crosley, Debby, MD      . opium-belladonna (B&O SUPPRETTES) 16.2-60 MG suppository 1 suppository  1 suppository Rectal Q6H PRN Kathie Rhodes, MD   1 suppository at 07/15/17 1149  . polyethylene glycol (MIRALAX / GLYCOLAX) packet 17 g  17 g Oral Daily Dhungel, Nishant, MD   17 g at 07/17/17 1009  . QUEtiapine (SEROQUEL) tablet 25 mg  25 mg Oral Q2000 Crosley, Debby, MD   25 mg at 07/16/17 2047  . QUEtiapine (SEROQUEL) tablet 25 mg  25 mg Oral QHS Dhungel, Nishant, MD   25 mg at 07/16/17 2046     Discharge Medications: Please see discharge summary for a list of discharge medications.  Relevant Imaging Results:  Relevant Lab Results:   Additional Information SSN: [id number]  Nila Nephew, LCSW

## 2017-07-17 NOTE — Consult Note (Signed)
   [facility] CM Inpatient Consult   07/17/2017  Jeremy Macdonald 1925-08-10 [id number]   Screened for [facility] Care Management program due to hospital readmission. [name] signed up with [facility] program prior to last inpatient rehab discharge.   However, once [name] got home, [facility] program services were declined. He continued with home health with [facility] however.  [place] reached out to patient and wife for engagement for [place] [facility], since he did not fully enroll with [facility] Management services were declined as well with [place].   Patient now readmitted back to hospital. Spoke with wife at bedside. States " I cannot take care of him at home. He is going to rehab". Discussed that efforts were made to prevent readmission and that she declined those efforts with both [place] and [facility] services. Mrs. Molstad did not fully make the connection. Nonetheless, discharge plan is now for SNF. Continues to deny the need for [facility] Care Management follow up.   Left [facility] Care Management brochure with contact information again with Mrs. Voorheis to call in the future.   Spoke with inpatient RNCM regarding above notes.    Marthenia Rolling, MSN-Ed, RN,BSN [facility] Liaison [phone]

## 2017-07-17 NOTE — Evaluation (Signed)
Physical Therapy Evaluation Patient Details Name: Jeremy Macdonald MRN: [id number] DOB: [birth year] Today's Date: 07/17/2017   History of Present Illness  84yo male presenting with hematuria since the morning, history of chronic urinary retention and Foley. He had been tugging on his Foley, and has been fairly confused since a fall about one month ago. Diagnosed with gross hematuria, acute blood loss anemia. PMH post complex LAD PCI, PAF, AAA, chronic dizziness, OA, COPD, dementia, hx falls, HTN, HOH, hx heart cath   Clinical Impression   Patient received sleeping in bed with wife present, who gave all PLOF/home/equipment information. His wife also reports that she does not feel comfortable or able to safely take care of this patient at home at this point, it is very difficult for her to help him with mobility and ADLs per her report. Patient is able to perform functional bed mobility with ModA, and requires MinA for functional transfers and stand-pivot to chair. Able to take steps during transfer easily but did require assistance for safe navigation of walker within the room. He was left up in the chair with all needs met, chair alarm activated and family present this morning. He will likely benefit from short term rehabilitation in the SNF setting moving forward.    Follow Up Recommendations SNF    Equipment Recommendations  None recommended by PT(defer to next venue )    Recommendations for Other Services       Precautions / Restrictions Precautions Precautions: Fall Precaution Comments: watch HR and O2  Restrictions Weight Bearing Restrictions: No      Mobility  Bed Mobility Overal bed mobility: Needs Assistance Bed Mobility: Supine to Sit     Supine to sit: Mod assist     General bed mobility comments: ModA to bring legs around and bring trunk up, patient then able to maintain midline sitting at EOB   Transfers Overall transfer level: Needs assistance Equipment used:  Rolling walker (2 wheeled) Transfers: Sit to/from [facility] Sit to Stand: Min assist Stand pivot transfers: Min assist       General transfer comment: cues for hand placement, minA to power up to full standing position; MinA with stand pivot mostly for walker navigation and safety   Ambulation/Gait             General Gait Details: able to take 3-4 steps during stand pivot transfer   Stairs            Wheelchair Mobility    Modified Rankin (Stroke Patients Only)       Balance Overall balance assessment: Needs assistance Sitting-balance support: Bilateral upper extremity supported;Feet supported Sitting balance-Leahy Scale: Good     Standing balance support: Bilateral upper extremity supported;During functional activity Standing balance-Leahy Scale: Fair Standing balance comment: reliant on UE support                              Pertinent Vitals/Pain Pain Assessment: Faces Pain Score: 0-No pain Faces Pain Scale: No hurt Pain Intervention(s): Limited activity within patient's tolerance;Monitored during session    [place] expects to be discharged to:: Private residence Living Arrangements: Spouse/significant other Available Help at Discharge: Family(elderly wife, working son, neighbors ) Type of Home: Apartment Home Access: Level entry     Home Layout: One level Home Equipment: Walker - standard;Cane - single point      Prior Function Level of Independence: Independent with assistive device(s)  Comments: per wife, patient has walker but does not like to use it; had been using cane but had falls using this      Hand Dominance   Dominant Hand: Right    Extremity/Trunk Assessment   Upper Extremity Assessment Upper Extremity Assessment: Defer to OT evaluation    Lower Extremity Assessment Lower Extremity Assessment: Generalized weakness    Cervical / Trunk Assessment Cervical / Trunk  Assessment: Kyphotic  Communication   Communication: HOH  Cognition Arousal/Alertness: Awake/alert Behavior During Therapy: Restless;Impulsive Overall Cognitive Status: History of cognitive impairments - at baseline                                 General Comments: History of dementia      General Comments General comments (skin integrity, edema, etc.): patient at 91% on room air, no dizziness reported today     Exercises     Assessment/Plan    PT Assessment Patient needs continued PT services  PT Problem List Decreased cognition;Decreased knowledge of use of DME;Decreased knowledge of precautions;Decreased mobility;Decreased balance;Decreased activity tolerance;Decreased strength;Pain;Decreased safety awareness;Decreased coordination       PT Treatment Interventions DME instruction;Gait training;Functional mobility training;Therapeutic activities;Therapeutic exercise;Balance training;Neuromuscular re-education;Cognitive remediation;Patient/family education    PT Goals (Current goals can be found in the Care Plan section)  Acute Rehab PT Goals Patient Stated Goal: to get up and moving  PT Goal Formulation: With patient/family Time For Goal Achievement: 07/31/17 Potential to Achieve Goals: Good    Frequency Min 3X/week   Barriers to discharge Decreased caregiver support wife reports she does not feel taht she can care for patient on her own, it has been difficult at home     Co-evaluation               AM-PAC PT "6 Clicks" Daily Activity  Outcome Measure Difficulty turning over in bed (including adjusting bedclothes, sheets and blankets)?: Unable Difficulty moving from lying on back to sitting on the side of the bed? : Unable Difficulty sitting down on and standing up from a chair with arms (e.g., wheelchair, bedside commode, etc,.)?: Unable Help needed moving to and from a bed to chair (including a wheelchair)?: A Little Help needed walking in  hospital room?: A Little Help needed climbing 3-5 steps with a railing? : A Lot 6 Click Score: 11    End of Session Equipment Utilized During Treatment: Gait belt Activity Tolerance: Patient tolerated treatment well Patient left: in chair;with call bell/phone within reach;with chair alarm set;with family/visitor present   PT Visit Diagnosis: Unsteadiness on feet (R26.81);Muscle weakness (generalized) (M62.81);Difficulty in walking, not elsewhere classified (R26.2);History of falling (Z91.81)    Time: 0935-1001 PT Time Calculation (min) (ACUTE ONLY): 26 min   Charges:   PT Evaluation $PT Eval Moderate Complexity: 1 Mod PT Treatments $Therapeutic Activity: 8-22 mins   PT G Codes:        Deniece Ree PT, DPT, CBIS  Supplemental Physical Therapist [place]   Pager [phone]

## 2017-07-17 NOTE — Progress Notes (Signed)
PROGRESS NOTE                                                                                                                                                                                                             Patient Demographics:    Jeremy Macdonald, is a 84 y.o. male, DOB - 06/19/1925, GUY:[id number]  Admit date - 07/14/2017   Admitting Physician Quintella Baton, MD  Outpatient Primary MD for the patient is Vernie Shanks, MD  LOS - 2  Outpatient Specialists: urology  Chief Complaint  Patient presents with  . Hematuria       Brief Narrative   84 year old male with history of coronary artery disease status post PCI on 04/24/2017, paroxysmal A. 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  This was likely in the setting of patient pulling on his Foley.  Urology consulted and patient getting continuous bladder irrigation.   Subjective:   Per nurse the coud catheter came out overnight and was placed by urology this morning??? ( dont see anything documented).  Has not had a bowel movement.  Denies any pain.   Assessment  & Plan :   Principal problem Gross hematuria with chronic indwelling Foley Secondary to patient frequently pulling on his Foley with confusion.  Patient was taken off Plavix 3 months after PCI due to increased risk of bleeding.  Xarelto held. Neurology consult appreciated.  Bedside cystoscopy on 2/10 showing only gross blood.  A 20 French coud tip three-way catheter placed followed by continuous bladder irrigation.  Now has clear urine on the back.  H&H low but stable. -Continue dutasteride and Flomax.  Further recommendations per urology.  Abdominal pain No further symptoms.  Possibly due to constipation.  CT abdomen without any obstruction.  On scheduled MiraLAX.   Lobar  pneumonia Incidental finding on CT abdomen, likely contributing to abdominal pain.  Continue Rocephin and azithromycin. Still some crackles right lower base, with occasional dry cough, continue rocephin/zithro Will need to repeat ct chest in a few weeks after finish abx treatment to r/o underline maliganancy  Paroxysmal A. fib Xarelto held for now due to hematuria.  Cardiology consult appreciated and recommend to continue baby aspirin, amiodarone, Cardizem, metoprolol and digoxin.  Patient will follow up  with his cardiologist as outpatient will decide on continuing anticoagulation.  (With his increased fall risk recently he may need to be off anticoagulation completely).  Coronary artery disease status post PCI On baby aspirin, statin and metoprolol.  Essential hypertension Stable.  Continue metoprolol and Cardizem.  Progressive dementia As per wife patient has been getting confused with multiple falls after being discharged from rehab recently.  Wife is hesitant to take him home and feels she will not be able to lift him or assist him and moving around.  She is interested in SNF.  Social work consulted. Continue Seroquel daily at bedtime.     Code Status : DNR  Family Communication  : Wife and son at bedside  Disposition Plan  : SNF on 2/13 with indwelling foley  Barriers For Discharge : Improving symptoms  Consults  : Urology Cardiology (Dr Einar Gip)  Procedures  : CT abdomen  DVT Prophylaxis  : SCDs  Lab Results  Component Value Date   PLT 161 07/16/2017    Antibiotics  :    Anti-infectives (From admission, onward)   Start     Dose/Rate Route Frequency Ordered Stop   07/16/17 2200  cefTRIAXone (ROCEPHIN) 1 g in sodium chloride 0.9 % 100 mL IVPB     1 g 200 mL/hr over 30 Minutes Intravenous Every 24 hours 07/16/17 0853     07/15/17 2200  cefTRIAXone (ROCEPHIN) 1 g in dextrose 5 % 50 mL IVPB  Status:  Discontinued     1 g 100 mL/hr over 30 Minutes Intravenous Every 24  hours 07/15/17 1509 07/16/17 0853   07/15/17 1515  azithromycin (ZITHROMAX) tablet 500 mg     500 mg Oral Daily 07/15/17 1509     07/14/17 2015  cefTRIAXone (ROCEPHIN) 1 g in dextrose 5 % 50 mL IVPB     1 g 100 mL/hr over 30 Minutes Intravenous  Once 07/14/17 2011 07/14/17 2204        Objective:   Vitals:   07/16/17 2255 07/17/17 0527 07/17/17 0900 07/17/17 1007  BP: (!) 123/56 112/66 (!) 113/51 115/64  Pulse: 63 94 70 76  Resp: 18 20 16    Temp: 98.4 F (36.9 C) 97.9 F (36.6 C) 97.9 F (36.6 C)   TempSrc: Oral Axillary Axillary   SpO2: 100% 93% 93%   Weight:  71.7 kg (158 lb 1.6 oz)    Height:        Wt Readings from Last 3 Encounters:  07/17/17 71.7 kg (158 lb 1.6 oz)  07/10/17 72.4 kg (159 lb 9.8 oz)  06/25/17 78.3 kg (172 lb 9.6 oz)     Intake/Output Summary (Last 24 hours) at 07/17/2017 1146 Last data filed at 07/17/2017 0850 Gross per 24 hour  Intake 2530 ml  Output 1100 ml  Net 1430 ml     Physical Exam  Gen: not in distress, demented HEENT: Pallor present, moist mucosa, supple neck Chest: clear b/l, no added sounds CVS: S1 and S2 irregular, no murmurs GI: soft, NT, ND, BS+, coud catheter with bladder irrigation, draining clear urine Musculoskeletal: warm, no edema CNS: AAOX2, (oriented to place and person), nonfocal    Data Review:    CBC Recent Labs  Lab 07/14/17 1744  07/15/17 1754 07/16/17 0152 07/16/17 1000 07/16/17 1819 07/17/17 0211  WBC 13.2*  --   --  8.5  --   --   --   HGB 10.6*   < > 8.8* 9.7* 9.1* 8.1* 8.8*  HCT 33.0*   < >  27.3* 29.9* 28.1* 25.2* 27.0*  PLT 188  --   --  161  --   --   --   MCV 93.8  --   --  93.4  --   --   --   MCH 30.1  --   --  30.3  --   --   --   MCHC 32.1  --   --  32.4  --   --   --   RDW 15.3  --   --  15.2  --   --   --   LYMPHSABS 1.5  --   --   --   --   --   --   MONOABS 1.3*  --   --   --   --   --   --   EOSABS 0.0  --   --   --   --   --   --   BASOSABS 0.0  --   --   --   --   --   --     < > = values in this interval not displayed.    Chemistries  Recent Labs  Lab 07/14/17 1744 07/15/17 0234 07/16/17 0152  NA 138 137 139  K 4.1 4.0 3.8  CL 104 106 104  CO2 27 25 23   GLUCOSE 133* 112* 141*  BUN 18 16 19   CREATININE 0.89 0.77 0.93  CALCIUM 8.2* 7.6* 8.0*  MG  --  1.6* 2.0  AST 24  --   --   ALT 26  --   --   ALKPHOS 81  --   --   BILITOT 0.7  --   --    ------------------------------------------------------------------------------------------------------------------ No results for input(s): CHOL, HDL, LDLCALC, TRIG, CHOLHDL, LDLDIRECT in the last 72 hours.  No results found for: HGBA1C ------------------------------------------------------------------------------------------------------------------ No results for input(s): TSH, T4TOTAL, T3FREE, THYROIDAB in the last 72 hours.  Invalid input(s): FREET3 ------------------------------------------------------------------------------------------------------------------ No results for input(s): VITAMINB12, FOLATE, FERRITIN, TIBC, IRON, RETICCTPCT in the last 72 hours.  Coagulation profile Recent Labs  Lab 07/15/17 0234  INR 1.52    No results for input(s): DDIMER in the last 72 hours.  Cardiac Enzymes No results for input(s): CKMB, TROPONINI, MYOGLOBIN in the last 168 hours.  Invalid input(s): CK ------------------------------------------------------------------------------------------------------------------    Component Value Date/Time   BNP 806.8 (H) 06/20/2017 1033    Inpatient Medications  Scheduled Meds: . amiodarone  200 mg Oral Daily  . atorvastatin  10 mg Oral q1800  . azithromycin  500 mg Oral Q1500  . digoxin  0.125 mg Oral Daily  . diltiazem  60 mg Oral Q8H  . dutasteride  0.5 mg Oral Daily   And  . tamsulosin  0.4 mg Oral Daily  . enoxaparin (LOVENOX) injection  40 mg Subcutaneous Q24H  . magnesium citrate  1 Bottle Oral Once  . megestrol  400 mg Oral BID  . metoprolol tartrate   50 mg Oral BID  . multivitamin with minerals  1 tablet Oral Daily  . polyethylene glycol  17 g Oral Daily  . QUEtiapine  25 mg Oral Q2000  . QUEtiapine  25 mg Oral QHS   Continuous Infusions: . cefTRIAXone (ROCEPHIN)  IV Stopped (07/16/17 2117)   PRN Meds:.acetaminophen **OR** acetaminophen, docusate sodium, morphine injection, ondansetron **OR** ondansetron (ZOFRAN) IV, opium-belladonna  Micro Results Recent Results (from the past 240 hour(s))  Urine culture     Status: None   Collection Time: 07/14/17  8:48 PM  Result Value Ref Range Status   Specimen Description   Final    URINE, RANDOM Performed at [facility] [address] [place], [place] [address]    Special Requests NONE  Final   Culture   Final    NO GROWTH Performed at [facility], [place] [address] [place], [place] [address]    Report Status 07/16/2017 FINAL  Final    Radiology Reports Dg Chest 1 View  Result Date: 06/20/2017 CLINICAL DATA:  Pain following fall EXAM: CHEST 1 VIEW COMPARISON:  March 08, 2017 FINDINGS: There is scarring in the right lower lobe region. There is no edema or consolidation. Heart is upper normal in size with pulmonary vascularity within normal limits. There is aortic atherosclerosis. No adenopathy. No pneumothorax. No bone lesions. IMPRESSION: Scarring right lower lobe. No edema or consolidation. Stable cardiac silhouette. There is aortic atherosclerosis. Aortic Atherosclerosis (ICD10-I70.0). Electronically Signed   By: Lowella Grip III M.D.   On: 06/20/2017 11:32   Dg Knee 1-2 Views Right  Result Date: 06/25/2017 CLINICAL DATA:  Pain when standing to right knee. EXAM: RIGHT KNEE - 1-2 VIEW COMPARISON:  06/20/2017 FINDINGS: Chondrocalcinosis in medial and lateral compartments. Early marginal spur formation about all 3 compartments. Normal alignment. There is a small effusion in the suprapatellar bursa. Negative for fracture. Extensive popliteal arterial  calcifications. IMPRESSION: 1. Negative for fracture or other acute bone abnormality. 2. Chondrocalcinosis with early tricompartmental degenerative change and effusion, suggesting CPPD arthropathy. Electronically Signed   By: Lucrezia Europe M.D.   On: 06/25/2017 19:03   Ct Head Wo Contrast  Result Date: 06/20/2017 CLINICAL DATA:  Dizziness and pain following recent fall EXAM: CT HEAD WITHOUT CONTRAST CT CERVICAL SPINE WITHOUT CONTRAST TECHNIQUE: Multidetector CT imaging of the head and cervical spine was performed following the standard protocol without intravenous contrast. Multiplanar CT image reconstructions of the cervical spine were also generated. COMPARISON:  CT head and CT cervical spine June 17, 2017 FINDINGS: CT HEAD FINDINGS Brain: There is persistent diffuse cerebral atrophy. There is no demonstrable mass, hemorrhage, extra-axial fluid collection, or midline shift. Widespread small vessel disease throughout the centra semiovale is stable. Small vessel disease is noted in each external capsule anteriorly. No acute infarct appreciable. Vascular: No hyperdense vessel. There is calcification in each carotid siphon and distal vertebral artery region. Skull: Bony calvarium appears intact. Fractures of the anterior left and right nasal bones remain stable compared to recent CT. No new fracture evident. Sinuses/Orbits: There is mucosal thickening in several ethmoid air cells bilaterally. There is a retention cyst in the inferior right maxillary antrum. There is a small retention cyst in the anterior left maxillary antrum. Orbits appear symmetric bilaterally. Other: Mastoid air cells are clear. CT CERVICAL SPINE FINDINGS Alignment: There is stable 2 mm retrolisthesis of C3 on C4. No new spondylolisthesis. Skull base and vertebrae: Skull base and craniocervical junction regions appear normal. There is mild pannus posterior to the odontoid which is not causing impression on the craniocervical junction. There is  no demonstrable fracture. No blastic or lytic bone lesions. Soft tissues and spinal canal: Prevertebral soft tissues and predental space regions are normal. No paraspinous lesions are evident. There is no cord or canal hematoma. Disc levels: There is moderately severe disc space narrowing at C3-4. There is mild disc space narrowing at C5-6. There is facet hypertrophy at several levels without appreciable nerve root edema or effacement. No disc extrusion or stenosis. Upper chest:  There is mild scarring in the lung apices. No upper lung zone edema or consolidation. Other: There is atherosclerotic calcification in both subclavian arteries as well as in both carotid and vertebral arteries. Calcification appears fairly extensive in the extracranial carotid arteries. IMPRESSION: CT head: Diffuse atrophy with extensive supratentorial small vessel disease, unchanged compared to recent study. No mass, hemorrhage, or extra-axial fluid collection. Foci of arterial vascular calcification noted. Nasal bone fracture stable compared to recent study. Areas of paranasal sinus disease noted. CT cervical spine: No fracture. Mild spondylolisthesis at C3-4 is felt to be due to underlying spondylosis. There is osteoarthritic changes several levels. There is extensive vascular calcification, most notably in the extracranial carotid arteries bilaterally. Electronically Signed   By: Lowella Grip III M.D.   On: 06/20/2017 11:04   Ct Head Wo Contrast  Result Date: 06/17/2017 CLINICAL DATA:  [place] today and struck head and face. EXAM: CT HEAD WITHOUT CONTRAST CT MAXILLOFACIAL WITHOUT CONTRAST CT CERVICAL SPINE WITHOUT CONTRAST TECHNIQUE: Multidetector CT imaging of the head, cervical spine, and maxillofacial structures were performed using the standard protocol without intravenous contrast. Multiplanar CT image reconstructions of the cervical spine and maxillofacial structures were also generated. COMPARISON:  08/15/2009 head CT  FINDINGS: CT HEAD FINDINGS Brain: Progressive age related cerebral atrophy, ventriculomegaly and periventricular white matter disease. No extra-axial fluid collections are identified. No CT findings for acute hemispheric infarction or intracranial hemorrhage. No mass lesions. The brainstem and cerebellum are normal. Vascular: No hyperdense vessels or aneurysm. Stable vascular calcifications. Skull: No acute skull fracture or bone lesion. Other: No scalp laceration or hematoma. CT MAXILLOFACIAL FINDINGS Osseous: Small fractures involving the left nasal bone and bony is a bridge. There also multiple lacerations and radiopaque foreign bodies in the soft tissues overlying the nose. The bony nasal septum is intact. No other acute facial bone fractures are identified. The mandibular condyles are normally located. No mandible fracture. Torus mandibularis noted incidentally. Orbits: The orbital bones are intact.  The globes are intact. Sinuses: Mucous retention cysts or possible polyps in the maxillary sinuses. The other paranasal sinuses are clear and the mastoid air cells and middle ear cavities are clear. Soft tissues: Soft tissue swelling and lacerations involving the nose. CT CERVICAL SPINE FINDINGS Alignment: Normal Skull base and vertebrae: No acute fracture. Soft tissues and spinal canal: No prevertebral fluid or swelling. No visible canal hematoma. Disc levels: No significant disc protrusions, spinal or foraminal stenosis. Upper chest: The lung apices are grossly clear. Other: Extensive carotid artery calcifications. IMPRESSION: 1. Progressive cerebral atrophy, ventriculomegaly and periventricular white matter disease. 2. No new/acute intracranial findings or skull fracture. 3. Small nasal bone fractures and multiple lacerations and small radiopaque foreign bodies involving the soft tissues of the nose. 4. No acute cervical spine fracture. Electronically Signed   By: Marijo Sanes M.D.   On: 06/17/2017 18:53   Ct  Cervical Spine Wo Contrast  Result Date: 06/20/2017 CLINICAL DATA:  Dizziness and pain following recent fall EXAM: CT HEAD WITHOUT CONTRAST CT CERVICAL SPINE WITHOUT CONTRAST TECHNIQUE: Multidetector CT imaging of the head and cervical spine was performed following the standard protocol without intravenous contrast. Multiplanar CT image reconstructions of the cervical spine were also generated. COMPARISON:  CT head and CT cervical spine June 17, 2017 FINDINGS: CT HEAD FINDINGS Brain: There is persistent diffuse cerebral atrophy. There is no demonstrable mass, hemorrhage, extra-axial fluid collection, or midline shift. Widespread small vessel disease throughout the centra semiovale is stable. Small vessel disease is  noted in each external capsule anteriorly. No acute infarct appreciable. Vascular: No hyperdense vessel. There is calcification in each carotid siphon and distal vertebral artery region. Skull: Bony calvarium appears intact. Fractures of the anterior left and right nasal bones remain stable compared to recent CT. No new fracture evident. Sinuses/Orbits: There is mucosal thickening in several ethmoid air cells bilaterally. There is a retention cyst in the inferior right maxillary antrum. There is a small retention cyst in the anterior left maxillary antrum. Orbits appear symmetric bilaterally. Other: Mastoid air cells are clear. CT CERVICAL SPINE FINDINGS Alignment: There is stable 2 mm retrolisthesis of C3 on C4. No new spondylolisthesis. Skull base and vertebrae: Skull base and craniocervical junction regions appear normal. There is mild pannus posterior to the odontoid which is not causing impression on the craniocervical junction. There is no demonstrable fracture. No blastic or lytic bone lesions. Soft tissues and spinal canal: Prevertebral soft tissues and predental space regions are normal. No paraspinous lesions are evident. There is no cord or canal hematoma. Disc levels: There is moderately  severe disc space narrowing at C3-4. There is mild disc space narrowing at C5-6. There is facet hypertrophy at several levels without appreciable nerve root edema or effacement. No disc extrusion or stenosis. Upper chest: There is mild scarring in the lung apices. No upper lung zone edema or consolidation. Other: There is atherosclerotic calcification in both subclavian arteries as well as in both carotid and vertebral arteries. Calcification appears fairly extensive in the extracranial carotid arteries. IMPRESSION: CT head: Diffuse atrophy with extensive supratentorial small vessel disease, unchanged compared to recent study. No mass, hemorrhage, or extra-axial fluid collection. Foci of arterial vascular calcification noted. Nasal bone fracture stable compared to recent study. Areas of paranasal sinus disease noted. CT cervical spine: No fracture. Mild spondylolisthesis at C3-4 is felt to be due to underlying spondylosis. There is osteoarthritic changes several levels. There is extensive vascular calcification, most notably in the extracranial carotid arteries bilaterally. Electronically Signed   By: Lowella Grip III M.D.   On: 06/20/2017 11:04   Ct Cervical Spine Wo Contrast  Result Date: 06/17/2017 CLINICAL DATA:  [place] today and struck head and face. EXAM: CT HEAD WITHOUT CONTRAST CT MAXILLOFACIAL WITHOUT CONTRAST CT CERVICAL SPINE WITHOUT CONTRAST TECHNIQUE: Multidetector CT imaging of the head, cervical spine, and maxillofacial structures were performed using the standard protocol without intravenous contrast. Multiplanar CT image reconstructions of the cervical spine and maxillofacial structures were also generated. COMPARISON:  08/15/2009 head CT FINDINGS: CT HEAD FINDINGS Brain: Progressive age related cerebral atrophy, ventriculomegaly and periventricular white matter disease. No extra-axial fluid collections are identified. No CT findings for acute hemispheric infarction or intracranial  hemorrhage. No mass lesions. The brainstem and cerebellum are normal. Vascular: No hyperdense vessels or aneurysm. Stable vascular calcifications. Skull: No acute skull fracture or bone lesion. Other: No scalp laceration or hematoma. CT MAXILLOFACIAL FINDINGS Osseous: Small fractures involving the left nasal bone and bony is a bridge. There also multiple lacerations and radiopaque foreign bodies in the soft tissues overlying the nose. The bony nasal septum is intact. No other acute facial bone fractures are identified. The mandibular condyles are normally located. No mandible fracture. Torus mandibularis noted incidentally. Orbits: The orbital bones are intact.  The globes are intact. Sinuses: Mucous retention cysts or possible polyps in the maxillary sinuses. The other paranasal sinuses are clear and the mastoid air cells and middle ear cavities are clear. Soft tissues: Soft tissue swelling and lacerations involving  the nose. CT CERVICAL SPINE FINDINGS Alignment: Normal Skull base and vertebrae: No acute fracture. Soft tissues and spinal canal: No prevertebral fluid or swelling. No visible canal hematoma. Disc levels: No significant disc protrusions, spinal or foraminal stenosis. Upper chest: The lung apices are grossly clear. Other: Extensive carotid artery calcifications. IMPRESSION: 1. Progressive cerebral atrophy, ventriculomegaly and periventricular white matter disease. 2. No new/acute intracranial findings or skull fracture. 3. Small nasal bone fractures and multiple lacerations and small radiopaque foreign bodies involving the soft tissues of the nose. 4. No acute cervical spine fracture. Electronically Signed   By: Marijo Sanes M.D.   On: 06/17/2017 18:53   Ct Abdomen Pelvis W Contrast  Result Date: 07/15/2017 CLINICAL DATA:  Abdominal distention EXAM: CT ABDOMEN AND PELVIS WITH CONTRAST TECHNIQUE: Multidetector CT imaging of the abdomen and pelvis was performed using the standard protocol following  bolus administration of intravenous contrast. CONTRAST:  170mL ISOVUE-300 IOPAMIDOL (ISOVUE-300) INJECTION 61% COMPARISON:  Plain films earlier today.  CT 11/29/2016 FINDINGS: Lower chest: Small right pleural effusion. Dependent atelectasis in the lower lobes. Nodular appearing density in the right lower lobe on image 7 measures up to 16 mm. This could reflect early infiltrate/pneumonia. This was not present on prior CT abdomen. Hepatobiliary: Scattered low densities in the liver compatible with cysts. Gallbladder unremarkable. No biliary ductal dilatation. Pancreas: No focal abnormality or ductal dilatation. Spleen: No focal abnormality.  Normal size. Adrenals/Urinary Tract: No adrenal mass. No hydronephrosis. Small cysts in the kidneys bilaterally. Foley catheter in the bladder which is decompressed. Stomach/Bowel: Left colonic diverticulosis. No active diverticulitis. Upper abdominal small bowel loops are mildly prominent, but there is no focal transition point. I favor this represents normal bowel gas pattern or slight ileus. Stomach is decompressed, unremarkable. Vascular/Lymphatic: Infrarenal abdominal aortic aneurysm, 4.7 cm maximally, stable since prior study. Extensive mural plaque. 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 No adenopathy. Reproductive: Marked prostate enlargement. Other: No free fluid or free air. Musculoskeletal: No acute bony abnormality. IMPRESSION: No evidence of bowel obstruction. 4.7 cm infrarenal abdominal aortic aneurysm. 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 Findings are stable. Left colonic diverticulosis.  No active diverticulitis. Marked prostate enlargement. Small right pleural effusion. Nodular airspace opacity in the right lower lobe could reflect early infiltrate/pneumonia. Recommend follow-up CT after treatment to exclude pulmonary nodule. Electronically Signed   By: Rolm Baptise M.D.   On: 07/15/2017 13:31   US Renal  Result Date:  06/21/2017 CLINICAL DATA:  Oliguria. EXAM: RENAL / URINARY TRACT ULTRASOUND COMPLETE COMPARISON:  None. FINDINGS: Right Kidney: Length: 9.8 cm. Echogenicity is within normal limits. No mass or hydronephrosis visualized. Left Kidney: Length: 10.9 cm. Echogenicity within normal limits. No hydronephrosis visualized. 2.5 cm simple cyst is seen in lower pole. Bladder: Decompressed secondary to Foley catheter. Severely enlarged prostate gland is again noted, measuring 6.8 x 6.2 x 4.9 cm. IMPRESSION: No hydronephrosis or renal obstruction is noted. Severely enlarged prostate gland is again noted as described on prior CT scan. Electronically Signed   By: Marijo Conception, M.D.   On: 06/21/2017 09:24   Dg Knee Complete 4 Views Left  Result Date: 06/20/2017 CLINICAL DATA:  Fall.  Anterior knee pain, left greater than right. EXAM: LEFT KNEE - COMPLETE 4+ VIEW; RIGHT KNEE - COMPLETE 4+ VIEW COMPARISON:  None. FINDINGS: Mild degenerative changes are most evident in the medial and femoral compartments bilaterally. A small right-sided joint effusion is present. Chondrocalcinosis is present in the  menisci bilaterally. Vascular calcifications are present. The joints are located bilaterally. No acute osseous abnormalities are present. IMPRESSION: 1. Bilateral chondrocalcinosis compatible with CPPD arthropathy. 2. Mild osseous degenerative changes in the medial and patellofemoral compartments bilaterally. 3. No acute osseous abnormality. 4. Extensive vascular calcifications.  Atherosclerosis. Electronically Signed   By: San Morelle M.D.   On: 06/20/2017 11:34   Dg Knee Complete 4 Views Right  Result Date: 06/20/2017 CLINICAL DATA:  Fall.  Anterior knee pain, left greater than right. EXAM: LEFT KNEE - COMPLETE 4+ VIEW; RIGHT KNEE - COMPLETE 4+ VIEW COMPARISON:  None. FINDINGS: Mild degenerative changes are most evident in the medial and femoral compartments bilaterally. A small right-sided joint effusion is present.  Chondrocalcinosis is present in the menisci bilaterally. Vascular calcifications are present. The joints are located bilaterally. No acute osseous abnormalities are present. IMPRESSION: 1. Bilateral chondrocalcinosis compatible with CPPD arthropathy. 2. Mild osseous degenerative changes in the medial and patellofemoral compartments bilaterally. 3. No acute osseous abnormality. 4. Extensive vascular calcifications.  Atherosclerosis. Electronically Signed   By: San Morelle M.D.   On: 06/20/2017 11:34   Dg Abd 2 Views  Result Date: 07/15/2017 CLINICAL DATA:  Abdominal distention EXAM: ABDOMEN - 2 VIEW COMPARISON:  None. FINDINGS: Mild gaseous distention of small bowel loops. No free air organomegaly. No suspicious calcification. Vascular calcifications noted. IMPRESSION: Mild gaseous distention of small bowel loops. This could reflect early small bowel obstruction or mild ileus. Electronically Signed   By: Rolm Baptise M.D.   On: 07/15/2017 10:50   Dg Hand Complete Right  Result Date: 06/17/2017 CLINICAL DATA:  Posterior right hand laceration following a fall on concrete today. EXAM: RIGHT HAND - COMPLETE 3+ VIEW COMPARISON:  Right wrist dated 06/08/2017 and right hand dated 05/26/2013. FINDINGS: Degenerative changes at the articulation of the trapezium and trapezoid with the scaphoid. No fracture or dislocation seen. IMPRESSION: 1. No fracture. 2. Stable degenerative changes at the articulation of the trapezium and trapezoid with the scaphoid. Electronically Signed   By: Claudie Revering M.D.   On: 06/17/2017 18:24   Ct Maxillofacial Wo Contrast  Result Date: 06/17/2017 CLINICAL DATA:  [place] today and struck head and face. EXAM: CT HEAD WITHOUT CONTRAST CT MAXILLOFACIAL WITHOUT CONTRAST CT CERVICAL SPINE WITHOUT CONTRAST TECHNIQUE: Multidetector CT imaging of the head, cervical spine, and maxillofacial structures were performed using the standard protocol without intravenous contrast. Multiplanar CT  image reconstructions of the cervical spine and maxillofacial structures were also generated. COMPARISON:  08/15/2009 head CT FINDINGS: CT HEAD FINDINGS Brain: Progressive age related cerebral atrophy, ventriculomegaly and periventricular white matter disease. No extra-axial fluid collections are identified. No CT findings for acute hemispheric infarction or intracranial hemorrhage. No mass lesions. The brainstem and cerebellum are normal. Vascular: No hyperdense vessels or aneurysm. Stable vascular calcifications. Skull: No acute skull fracture or bone lesion. Other: No scalp laceration or hematoma. CT MAXILLOFACIAL FINDINGS Osseous: Small fractures involving the left nasal bone and bony is a bridge. There also multiple lacerations and radiopaque foreign bodies in the soft tissues overlying the nose. The bony nasal septum is intact. No other acute facial bone fractures are identified. The mandibular condyles are normally located. No mandible fracture. Torus mandibularis noted incidentally. Orbits: The orbital bones are intact.  The globes are intact. Sinuses: Mucous retention cysts or possible polyps in the maxillary sinuses. The other paranasal sinuses are clear and the mastoid air cells and middle ear cavities are clear. Soft tissues: Soft tissue swelling and lacerations  involving the nose. CT CERVICAL SPINE FINDINGS Alignment: Normal Skull base and vertebrae: No acute fracture. Soft tissues and spinal canal: No prevertebral fluid or swelling. No visible canal hematoma. Disc levels: No significant disc protrusions, spinal or foraminal stenosis. Upper chest: The lung apices are grossly clear. Other: Extensive carotid artery calcifications. IMPRESSION: 1. Progressive cerebral atrophy, ventriculomegaly and periventricular white matter disease. 2. No new/acute intracranial findings or skull fracture. 3. Small nasal bone fractures and multiple lacerations and small radiopaque foreign bodies involving the soft tissues  of the nose. 4. No acute cervical spine fracture. Electronically Signed   By: Marijo Sanes M.D.   On: 06/17/2017 18:53    Time Spent in minutes  [place] M.D  PhDon 07/17/2017 at 11:46 AM  Between 7am to 7pm - Pager - [phone]  After 7pm go to [email or web address] - password [email or web address]  [facility] -  Office  [phone]

## 2017-07-18 ENCOUNTER — Encounter: Payer: Medicare Other | Admitting: Registered Nurse

## 2017-07-18 DIAGNOSIS — N400 Enlarged prostate without lower urinary tract symptoms: Secondary | ICD-10-CM | POA: Diagnosis present

## 2017-07-18 DIAGNOSIS — T83511S Infection and inflammatory reaction due to indwelling urethral catheter, sequela: Secondary | ICD-10-CM | POA: Diagnosis not present

## 2017-07-18 DIAGNOSIS — E8809 Other disorders of plasma-protein metabolism, not elsewhere classified: Secondary | ICD-10-CM | POA: Diagnosis not present

## 2017-07-18 DIAGNOSIS — R609 Edema, unspecified: Secondary | ICD-10-CM | POA: Diagnosis not present

## 2017-07-18 DIAGNOSIS — F0150 Vascular dementia without behavioral disturbance: Secondary | ICD-10-CM | POA: Diagnosis not present

## 2017-07-18 DIAGNOSIS — N390 Urinary tract infection, site not specified: Secondary | ICD-10-CM | POA: Diagnosis not present

## 2017-07-18 DIAGNOSIS — R4182 Altered mental status, unspecified: Secondary | ICD-10-CM | POA: Diagnosis not present

## 2017-07-18 DIAGNOSIS — T83098D Other mechanical complication of other indwelling urethral catheter, subsequent encounter: Secondary | ICD-10-CM | POA: Diagnosis not present

## 2017-07-18 DIAGNOSIS — E43 Unspecified severe protein-calorie malnutrition: Secondary | ICD-10-CM | POA: Diagnosis not present

## 2017-07-18 DIAGNOSIS — J449 Chronic obstructive pulmonary disease, unspecified: Secondary | ICD-10-CM | POA: Diagnosis present

## 2017-07-18 DIAGNOSIS — I248 Other forms of acute ischemic heart disease: Secondary | ICD-10-CM | POA: Diagnosis present

## 2017-07-18 DIAGNOSIS — M79602 Pain in left arm: Secondary | ICD-10-CM | POA: Diagnosis present

## 2017-07-18 DIAGNOSIS — H9193 Unspecified hearing loss, bilateral: Secondary | ICD-10-CM | POA: Diagnosis not present

## 2017-07-18 DIAGNOSIS — F0390 Unspecified dementia without behavioral disturbance: Secondary | ICD-10-CM | POA: Diagnosis present

## 2017-07-18 DIAGNOSIS — S40012A Contusion of left shoulder, initial encounter: Secondary | ICD-10-CM | POA: Diagnosis not present

## 2017-07-18 DIAGNOSIS — I482 Chronic atrial fibrillation: Secondary | ICD-10-CM | POA: Diagnosis present

## 2017-07-18 DIAGNOSIS — R369 Urethral discharge, unspecified: Secondary | ICD-10-CM | POA: Diagnosis not present

## 2017-07-18 DIAGNOSIS — N139 Obstructive and reflux uropathy, unspecified: Secondary | ICD-10-CM | POA: Diagnosis not present

## 2017-07-18 DIAGNOSIS — F0280 Dementia in other diseases classified elsewhere without behavioral disturbance: Secondary | ICD-10-CM | POA: Diagnosis not present

## 2017-07-18 DIAGNOSIS — Z5181 Encounter for therapeutic drug level monitoring: Secondary | ICD-10-CM | POA: Diagnosis not present

## 2017-07-18 DIAGNOSIS — R627 Adult failure to thrive: Secondary | ICD-10-CM | POA: Diagnosis present

## 2017-07-18 DIAGNOSIS — E785 Hyperlipidemia, unspecified: Secondary | ICD-10-CM | POA: Diagnosis not present

## 2017-07-18 DIAGNOSIS — E440 Moderate protein-calorie malnutrition: Secondary | ICD-10-CM | POA: Diagnosis not present

## 2017-07-18 DIAGNOSIS — R8290 Unspecified abnormal findings in urine: Secondary | ICD-10-CM | POA: Diagnosis not present

## 2017-07-18 DIAGNOSIS — F17210 Nicotine dependence, cigarettes, uncomplicated: Secondary | ICD-10-CM | POA: Diagnosis present

## 2017-07-18 DIAGNOSIS — M25522 Pain in left elbow: Secondary | ICD-10-CM | POA: Diagnosis not present

## 2017-07-18 DIAGNOSIS — N401 Enlarged prostate with lower urinary tract symptoms: Secondary | ICD-10-CM | POA: Diagnosis not present

## 2017-07-18 DIAGNOSIS — I480 Paroxysmal atrial fibrillation: Secondary | ICD-10-CM | POA: Diagnosis not present

## 2017-07-18 DIAGNOSIS — S60212A Contusion of left wrist, initial encounter: Secondary | ICD-10-CM | POA: Diagnosis not present

## 2017-07-18 DIAGNOSIS — T83511A Infection and inflammatory reaction due to indwelling urethral catheter, initial encounter: Secondary | ICD-10-CM | POA: Diagnosis present

## 2017-07-18 DIAGNOSIS — S59902A Unspecified injury of left elbow, initial encounter: Secondary | ICD-10-CM | POA: Diagnosis not present

## 2017-07-18 DIAGNOSIS — Z8701 Personal history of pneumonia (recurrent): Secondary | ICD-10-CM | POA: Diagnosis not present

## 2017-07-18 DIAGNOSIS — I700 Atherosclerosis of aorta: Secondary | ICD-10-CM | POA: Diagnosis not present

## 2017-07-18 DIAGNOSIS — R310 Gross hematuria: Secondary | ICD-10-CM | POA: Diagnosis not present

## 2017-07-18 DIAGNOSIS — R140 Abdominal distension (gaseous): Secondary | ICD-10-CM | POA: Diagnosis not present

## 2017-07-18 DIAGNOSIS — E039 Hypothyroidism, unspecified: Secondary | ICD-10-CM | POA: Diagnosis not present

## 2017-07-18 DIAGNOSIS — G9341 Metabolic encephalopathy: Secondary | ICD-10-CM | POA: Diagnosis present

## 2017-07-18 DIAGNOSIS — Z9181 History of falling: Secondary | ICD-10-CM | POA: Diagnosis not present

## 2017-07-18 DIAGNOSIS — R600 Localized edema: Secondary | ICD-10-CM | POA: Diagnosis not present

## 2017-07-18 DIAGNOSIS — R41841 Cognitive communication deficit: Secondary | ICD-10-CM | POA: Diagnosis not present

## 2017-07-18 DIAGNOSIS — R748 Abnormal levels of other serum enzymes: Secondary | ICD-10-CM | POA: Diagnosis not present

## 2017-07-18 DIAGNOSIS — R319 Hematuria, unspecified: Secondary | ICD-10-CM | POA: Diagnosis not present

## 2017-07-18 DIAGNOSIS — D62 Acute posthemorrhagic anemia: Secondary | ICD-10-CM | POA: Diagnosis not present

## 2017-07-18 DIAGNOSIS — I2510 Atherosclerotic heart disease of native coronary artery without angina pectoris: Secondary | ICD-10-CM | POA: Diagnosis not present

## 2017-07-18 DIAGNOSIS — Y846 Urinary catheterization as the cause of abnormal reaction of the patient, or of later complication, without mention of misadventure at the time of the procedure: Secondary | ICD-10-CM | POA: Diagnosis present

## 2017-07-18 DIAGNOSIS — Z7982 Long term (current) use of aspirin: Secondary | ICD-10-CM | POA: Diagnosis not present

## 2017-07-18 DIAGNOSIS — Z6822 Body mass index (BMI) 22.0-22.9, adult: Secondary | ICD-10-CM | POA: Diagnosis not present

## 2017-07-18 DIAGNOSIS — R339 Retention of urine, unspecified: Secondary | ICD-10-CM | POA: Diagnosis not present

## 2017-07-18 DIAGNOSIS — C3491 Malignant neoplasm of unspecified part of right bronchus or lung: Secondary | ICD-10-CM | POA: Diagnosis not present

## 2017-07-18 DIAGNOSIS — Z955 Presence of coronary angioplasty implant and graft: Secondary | ICD-10-CM | POA: Diagnosis not present

## 2017-07-18 DIAGNOSIS — R0602 Shortness of breath: Secondary | ICD-10-CM | POA: Diagnosis not present

## 2017-07-18 DIAGNOSIS — Z85828 Personal history of other malignant neoplasm of skin: Secondary | ICD-10-CM | POA: Diagnosis not present

## 2017-07-18 DIAGNOSIS — I10 Essential (primary) hypertension: Secondary | ICD-10-CM | POA: Diagnosis present

## 2017-07-18 DIAGNOSIS — C3411 Malignant neoplasm of upper lobe, right bronchus or lung: Secondary | ICD-10-CM | POA: Diagnosis not present

## 2017-07-18 DIAGNOSIS — R1312 Dysphagia, oropharyngeal phase: Secondary | ICD-10-CM | POA: Diagnosis not present

## 2017-07-18 DIAGNOSIS — M6281 Muscle weakness (generalized): Secondary | ICD-10-CM | POA: Diagnosis not present

## 2017-07-18 DIAGNOSIS — J189 Pneumonia, unspecified organism: Secondary | ICD-10-CM | POA: Diagnosis not present

## 2017-07-18 DIAGNOSIS — R05 Cough: Secondary | ICD-10-CM | POA: Diagnosis not present

## 2017-07-18 DIAGNOSIS — R338 Other retention of urine: Secondary | ICD-10-CM | POA: Diagnosis not present

## 2017-07-18 DIAGNOSIS — Z515 Encounter for palliative care: Secondary | ICD-10-CM | POA: Diagnosis not present

## 2017-07-18 DIAGNOSIS — I714 Abdominal aortic aneurysm, without rupture: Secondary | ICD-10-CM | POA: Diagnosis not present

## 2017-07-18 DIAGNOSIS — Z66 Do not resuscitate: Secondary | ICD-10-CM | POA: Diagnosis present

## 2017-07-18 DIAGNOSIS — C3431 Malignant neoplasm of lower lobe, right bronchus or lung: Secondary | ICD-10-CM | POA: Diagnosis present

## 2017-07-18 LAB — CBC
HCT: 28.2 % — ABNORMAL LOW (ref 39.0–52.0)
Hemoglobin: 9.1 g/dL — ABNORMAL LOW (ref 13.0–17.0)
MCH: 30.5 pg (ref 26.0–34.0)
MCHC: 32.3 g/dL (ref 30.0–36.0)
MCV: 94.6 fL (ref 78.0–100.0)
PLATELETS: 177 10*3/uL (ref 150–400)
RBC: 2.98 MIL/uL — ABNORMAL LOW (ref 4.22–5.81)
RDW: 15.4 % (ref 11.5–15.5)
WBC: 9.9 10*3/uL (ref 4.0–10.5)

## 2017-07-18 LAB — BASIC METABOLIC PANEL
Anion gap: 12 (ref 5–15)
BUN: 26 mg/dL — AB (ref 6–20)
CO2: 25 mmol/L (ref 22–32)
CREATININE: 0.95 mg/dL (ref 0.61–1.24)
Calcium: 8.2 mg/dL — ABNORMAL LOW (ref 8.9–10.3)
Chloride: 104 mmol/L (ref 101–111)
GFR calc Af Amer: >60 mL/min (ref >60)
Glucose, Bld: 130 mg/dL — ABNORMAL HIGH (ref 65–99)
Potassium: 3.7 mmol/L (ref 3.5–5.1)
SODIUM: 141 mmol/L (ref 135–145)

## 2017-07-18 LAB — MAGNESIUM: MAGNESIUM: 1.9 mg/dL (ref 1.7–2.4)

## 2017-07-18 MED ORDER — DIPHENHYDRAMINE HCL 25 MG PO CAPS: 25.0000 mg | ORAL_CAPSULE | Freq: Four times a day (QID) | ORAL | Status: DC | PRN

## 2017-07-18 MED ORDER — SENNOSIDES-DOCUSATE SODIUM 8.6-50 MG PO TABS: 1.0000 | ORAL_TABLET | Freq: Two times a day (BID) | ORAL | 0 refills | Status: AC

## 2017-07-18 MED ORDER — HALOPERIDOL LACTATE 5 MG/ML IJ SOLN
2.5000 mg | Freq: Once | INTRAMUSCULAR | Status: AC
  Administered 2017-07-18: 2.5 mg via INTRAMUSCULAR
  Filled 2017-07-18: qty 1

## 2017-07-18 MED ORDER — CLONAZEPAM 0.5 MG PO TABS: 0.2500 mg | ORAL_TABLET | Freq: Three times a day (TID) | ORAL | Status: DC | PRN

## 2017-07-18 MED ORDER — MAGNESIUM HYDROXIDE 400 MG/5ML PO SUSP
30.0000 mL | Freq: Two times a day (BID) | ORAL | Status: DC
  Administered 2017-07-18: 30 mL via ORAL
  Filled 2017-07-18: qty 30

## 2017-07-18 MED ORDER — CLONAZEPAM 0.5 MG PO TABS: 0.2500 mg | ORAL_TABLET | Freq: Three times a day (TID) | ORAL | 0 refills | Status: AC | PRN

## 2017-07-18 MED ORDER — AMOXICILLIN-POT CLAVULANATE 875-125 MG PO TABS: 1.0000 | ORAL_TABLET | Freq: Two times a day (BID) | ORAL | 0 refills | Status: AC

## 2017-07-18 MED ORDER — CLONAZEPAM 0.5 MG PO TABS
0.2500 mg | ORAL_TABLET | Freq: Three times a day (TID) | ORAL | Status: DC | PRN
  Administered 2017-07-18: 0.25 mg via ORAL
  Filled 2017-07-18: qty 1

## 2017-07-18 MED ORDER — POLYETHYLENE GLYCOL 3350 17 G PO PACK: 17.0000 g | PACK | Freq: Two times a day (BID) | ORAL | Status: DC

## 2017-07-18 MED ORDER — DIPHENHYDRAMINE HCL 25 MG PO CAPS: 25.0000 mg | ORAL_CAPSULE | Freq: Four times a day (QID) | ORAL | 0 refills | Status: DC | PRN

## 2017-07-18 MED ORDER — HALOPERIDOL 0.5 MG PO TABS
0.5000 mg | ORAL_TABLET | Freq: Three times a day (TID) | ORAL | Status: DC | PRN
  Filled 2017-07-18: qty 1

## 2017-07-18 MED ORDER — HALOPERIDOL 0.5 MG PO TABS: 0.5000 mg | ORAL_TABLET | Freq: Three times a day (TID) | ORAL | 0 refills | Status: DC | PRN

## 2017-07-18 MED ORDER — AMOXICILLIN-POT CLAVULANATE 875-125 MG PO TABS
1.0000 | ORAL_TABLET | Freq: Two times a day (BID) | ORAL | Status: DC
  Administered 2017-07-18: 1 via ORAL
  Filled 2017-07-18: qty 1

## 2017-07-18 MED ORDER — ASPIRIN EC 81 MG PO TBEC: 81.0000 mg | DELAYED_RELEASE_TABLET | Freq: Every day | ORAL | 2 refills | Status: AC

## 2017-07-18 NOTE — Progress Notes (Signed)
Pt discharged from the unit via ptar. Prior nurse called report to the facility. Pt stable at this time.

## 2017-07-18 NOTE — Clinical Social Work Placement (Signed)
Patient received and accepted bed offer at [facility]. Facility aware of patient's discharge and confirmed bed offer. PTAR contacted, patient's family notified. Patient's RN can call report to [phone], packet complete. CSW signing off, no other needs identified at this time.  CLINICAL SOCIAL WORK PLACEMENT  NOTE  Date:  07/18/2017  Patient Details  Name: Jeremy Macdonald MRN: [id number] Date of Birth: [birth year]  Clinical Social Work is seeking post-discharge placement for this patient at the [place] level of care (*CSW will initial, date and re-position this form in  chart as items are completed):  Yes   Patient/family provided with [facility] Work Department's list of facilities offering this level of care within the geographic area requested by the patient (or if unable, by the patient's family).  Yes   Patient/family informed of their freedom to choose among providers that offer the needed level of care, that participate in Medicare, Medicaid or managed care program needed by the patient, have an available bed and are willing to accept the patient.  Yes   Patient/family informed of [facility]'s ownership interest in [facility] and [facility], as well as of the fact that they are under no obligation to receive care at these facilities.  PASRR submitted to EDS on       PASRR number received on       Existing PASRR number confirmed on 07/17/17     FL2 transmitted to all facilities in geographic area requested by pt/family on 07/17/17     FL2 transmitted to all facilities within larger geographic area on       Patient informed that his/her managed care company has contracts with or will negotiate with certain facilities, including the following:  [facility]     Yes   Patient/family informed of bed offers received.  Patient chooses bed at [facility]     Physician recommends and patient chooses  bed at      Patient to be transferred to [facility] on 07/18/17.  Patient to be transferred to facility by PTAR     Patient family notified on 07/18/17 of transfer.  Name of family member notified:   wife [name] Bobette Mo     PHYSICIAN       Additional Comment:    _______________________________________________ Burnis Medin, LCSW 07/18/2017, 3:33 PM

## 2017-07-18 NOTE — Progress Notes (Signed)
Patient restless and agitated throughout the night, pulling tubes & lines, attempting to jump out of bed and hitting staff. A one time dose of Haldol was given IM. Staff and tele sitter present throughout the night. Will continue to monitor.

## 2017-07-18 NOTE — Care Management Important Message (Signed)
Important Message  Patient Details  Name: MAEL DELAP MRN: [id number] Date of Birth: 1926/05/31   Medicare Important Message Given:  Yes    Kerin Salen 07/18/2017, 12:07 [place] Message  Patient Details  Name: BRACH BIRDSALL MRN: [id number] Date of Birth: December 08, 1925   Medicare Important Message Given:  Yes    Kerin Salen 07/18/2017, 12:07 PM

## 2017-07-18 NOTE — Progress Notes (Signed)
CSW contacted by [facility] and informed that SNF is no longer able to offer patient a bed.   CSW provided update to patient's wife. Patient's wife inquired about next steps, noting that she has tried to care for patient in the home before and that she is unable to stay up 24 hours to provide care. CSW validated patient's wife's feelings and informed patient's wife that CSW will follow up with other SNFs to see who is able to offer patient a bed. CSW agreed to follow up with bed offers.  CSW will continue to follow and assist with discharge planning.  Abundio Miu, [place] Social Worker [facility] Cell#: [phone]

## 2017-07-18 NOTE — Discharge Summary (Addendum)
Discharge Summary  Jeremy Macdonald:[id number] DOB: 1925/11/03  PCP: Vernie Shanks, MD  Admit date: 07/14/2017 Discharge date: 07/18/2017  Time spent: >27mins, more than 50% time spent on coordination of care.  Recommendations for Outpatient Follow-up:  1. F/u with SNF MD  for hospital discharge follow up, repeat cbc/bmp at follow up. recommend palliative care to follow patient at snf 2. F/u urology for indwelling foley management  Discharge Diagnoses:  Active Hospital Problems   Diagnosis Date Noted  . Hematuria 07/15/2017  . Chronic anticoagulation 07/14/2017  . HTN (hypertension) 07/14/2017  . Gross hematuria   . Acute blood loss anemia   . Chronic obstructive pulmonary disease ([place])   . Dementia 06/21/2017  . Atrial fibrillation ([place]) 06/20/2017  . CAD (coronary artery disease), native coronary artery 04/22/2017    Resolved Hospital Problems  No resolved problems to display.    Discharge Condition: stable  Diet recommendation: heart healthy  Filed Weights   07/16/17 0540 07/17/17 0527 07/18/17 0511  Weight: 72.7 kg (160 lb 4.8 oz) 71.7 kg (158 lb 1.6 oz) 70 kg (154 lb 5.2 oz)    History of present illness: (per admitting MD) PCP:   Vernie Shanks, MD   Chief Complaint:  hematuria  HPI: this is a 84 y/o male who presents with hematuria since this AM. He has chronic urinary retention and foley in place. He apparently woke up during the night and was tugging on his foley. The patient has confusion. In the the ER he was seen by Urology and irrigated and flushed and continues to bleed. The patient is on Xarelto for a fib. He also has a AAA and a mural thrombus seen in November 2018.    He has been had urinary retention since 11/18, and has had hematuria on and off since.   In November had post complex LAD PCI 04/24/17, paroxysmal atrial fibrillation, abdominal aortic aneurysm and was on xarelto and plavix. The plavix was d/ced in 06/15/17 to decrease the  risk of bleeding but xarelto continued. There has been conversation re discontinuing xarelto because the patient has chronic dizziness and is a fall risk  He was d/c 4 days ago from [facility] rehab, during that hospitalization his foley had to be changed a few times with difficulty finally placed by Urology under cystocopy. Last night he tugged on it and has been bleeding since.  The hospitalist have been asked to brng patient overnight. History provided by wife and chart review. Patient is mostly confused. Per wife patient has been confused on and off recently since his fall where he suffered a contusion 1 month ago.    Hospital Course:  Active Problems:   CAD (coronary artery disease), native coronary artery   Atrial fibrillation (HCC)   Dementia   Chronic obstructive pulmonary disease (HCC)   Gross hematuria   Acute blood loss anemia   Chronic anticoagulation   HTN (hypertension)   Hematuria  Gross hematuria with chronic indwelling Foley, urine culture no growth. -Secondary to patient frequently pulling on his Foley with confusion.  Patient was taken off Plavix 3 months after PCI due to increased risk of bleeding.  Xarelto held. -urology consult appreciated.  Bedside cystoscopy on 2/10 showing only gross blood.  A 20 French coud tip three-way catheter placed followed by continuous bladder irrigation.  Now has clear urine.  H&H  stable. -Continue dutasteride and Flomax.   -keep foley in , follow up with urology.  Abdominal pain Resolved,  Possibly  From foley malfunction and  constipation.   CT abdomen without any obstruction, + large amount of stool.   Continue bowel regiment.    Lobar pneumonia, no cough, no hypoxia, no fever, no chest pain -CT abdomen: "Small right pleural effusion. Nodular airspace opacity in the right lower lobe could reflect early infiltrate/pneumonia. Recommend follow-up CT after treatment to exclude pulmonary nodule." -Incidental finding on CT abdomen,  likely contributing to abdominal pain.   -he received Rocephin and azithromycin in the hospital -Still some crackles right lower base, discharge on augmentin, repeat ct chest in a few weeks after finish abx treatment to r/o underline maliganancy  Paroxysmal A. fib Xarelto held for now due to hematuria.   Cardiology Dr Einar Gip consulted and recommend to continue baby aspirin, amiodarone, Cardizem, metoprolol and digoxin.   -Patient will follow up with Dr Annitta Needs as outpatient will decide on continuing anticoagulation.   (With his increased fall risk recently he may need to be off anticoagulation completely).  Coronary artery disease status post PCI On baby aspirin, statin and metoprolol.  Essential hypertension Stable.  Continue metoprolol and Cardizem.  Progressive dementia As per wife patient has been getting confused with multiple falls after being discharged from rehab recently.   Continue Seroquel daily at bedtime. Prn klonopin,  benadryl. Haldol discontinued due to concerns of interactive with amiodarone.     Code Status : DNR  Family Communication  : Wife and son at bedside  Disposition Plan  : SNF on 2/13 with indwelling foley  Barriers For Discharge : Improving symptoms  Consults  : Urology Cardiology (Dr Einar Gip)  Procedures  : CT abdomen  DVT Prophylaxis  : SCDs   Discharge Exam: BP 132/86 (BP Location: Right Arm)   Pulse (!) 51   Temp 98 F (36.7 C) (Axillary)   Resp 18   Ht 5\' 9"  (1.753 m)   Wt 70 kg (154 lb 5.2 oz)   SpO2 (!) 88%   BMI 22.79 kg/m   General: demented elderly, NAD Cardiovascular: IRRR Respiratory: mild left lower lobe crackles,  Ab: soft, + foley with clear urine  Discharge Instructions You were cared for by a hospitalist during your hospital stay. If you have any questions about your discharge medications or the care you received while you were in the hospital after you are discharged, you can call the unit and asked to  speak with the hospitalist on call if the hospitalist that took care of you is not available. Once you are discharged, your primary care physician will handle any further medical issues. Please note that NO REFILLS for any discharge medications will be authorized once you are discharged, as it is imperative that you return to your primary care physician (or establish a relationship with a primary care physician if you do not have one) for your aftercare needs so that they can reassess your need for medications and monitor your lab values.  Discharge Instructions    Diet - low sodium heart healthy   Complete by:  As directed    Increase activity slowly   Complete by:  As directed      Allergies as of 07/18/2017      Reactions   Phenergan [promethazine Hcl] Other (See Comments)   Confusion       Medication List    STOP taking these medications   docusate sodium 100 MG capsule Commonly known as:  COLACE   Rivaroxaban 15 MG Tabs tablet Commonly known as:  Alveda Reasons  TAKE these medications   acetaminophen 325 MG tablet Commonly known as:  TYLENOL Take 1-2 tablets (325-650 mg total) by mouth every 4 (four) hours as needed for mild pain.   amiodarone 200 MG tablet Commonly known as:  PACERONE Take 1 tablet (200 mg total) by mouth daily.   amoxicillin-clavulanate 875-125 MG tablet Commonly known as:  AUGMENTIN Take 1 tablet by mouth every 12 (twelve) hours for 4 days.   aspirin EC 81 MG tablet Take 1 tablet (81 mg total) by mouth daily.   atorvastatin 10 MG tablet Commonly known as:  LIPITOR Take 1 tablet (10 mg total) by mouth daily.   clonazePAM 0.5 MG tablet Commonly known as:  KLONOPIN Take 0.5 tablets (0.25 mg total) by mouth 3 (three) times daily as needed (severe agitation).   digoxin 0.125 MG tablet Commonly known as:  LANOXIN Take 1 tablet (0.125 mg total) by mouth daily.   diltiazem 60 MG tablet Commonly known as:  CARDIZEM Take 1 tablet (60 mg total) by mouth  every 8 (eight) hours.   diphenhydrAMINE 25 mg capsule Commonly known as:  BENADRYL Take 1 capsule (25 mg total) by mouth every 6 (six) hours as needed for itching or sleep (agitation).   Dutasteride-Tamsulosin HCl 0.5-0.4 MG Caps Commonly known as:  JALYN Take 1 tablet by mouth daily.   feeding supplement (ENSURE ENLIVE) Liqd Take 237 mLs by mouth 4 (four) times daily -  with meals and at bedtime.   megestrol 400 MG/10ML suspension Commonly known as:  MEGACE Take 10 mLs (400 mg total) by mouth 2 (two) times daily.   metoprolol tartrate 50 MG tablet Commonly known as:  LOPRESSOR Take 1 tablet (50 mg total) by mouth 2 (two) times daily.   multivitamin with minerals Tabs tablet Take 1 tablet by mouth daily.   polyethylene glycol packet Commonly known as:  MIRALAX / GLYCOLAX Take 17 g by mouth daily.   QUEtiapine 25 MG tablet Commonly known as:  SEROQUEL Take 1 tablet (25 mg total) by mouth daily at 8 pm.   senna-docusate 8.6-50 MG tablet Commonly known as:  Senokot-S Take 1 tablet by mouth 2 (two) times daily.      Allergies  Allergen Reactions  . Phenergan [Promethazine Hcl] Other (See Comments)    Confusion     Contact information for follow-up providers    Call McKenzie, Candee Furbish, MD.   Specialty:  Urology Why:  For an appointment next week to get your catheter out. Contact information: [address] [place] [address] [phone]        repeat ct chest in 3-4 weeks Follow up.   Why:  Small right pleural effusion. Nodular airspace opacity in the right lower lobe could reflect early infiltrate/pneumonia. Recommend follow-up CT after treatment to exclude pulmonary nodule.       Adrian Prows, MD Follow up.   Specialty:  Cardiology Contact information: [place] [address] [phone]            Contact information for after-discharge care    [facility] .   Service:  Skilled  Nursing Contact information: [address] [place] [phone]                   The results of significant diagnostics from this hospitalization (including imaging, microbiology, ancillary and laboratory) are listed below for reference.    Significant Diagnostic Studies: Dg Chest  1 View  Result Date: 06/20/2017 CLINICAL DATA:  Pain following fall EXAM: CHEST 1 VIEW COMPARISON:  March 08, 2017 FINDINGS: There is scarring in the right lower lobe region. There is no edema or consolidation. Heart is upper normal in size with pulmonary vascularity within normal limits. There is aortic atherosclerosis. No adenopathy. No pneumothorax. No bone lesions. IMPRESSION: Scarring right lower lobe. No edema or consolidation. Stable cardiac silhouette. There is aortic atherosclerosis. Aortic Atherosclerosis (ICD10-I70.0). Electronically Signed   By: Lowella Grip III M.D.   On: 06/20/2017 11:32   Dg Knee 1-2 Views Right  Result Date: 06/25/2017 CLINICAL DATA:  Pain when standing to right knee. EXAM: RIGHT KNEE - 1-2 VIEW COMPARISON:  06/20/2017 FINDINGS: Chondrocalcinosis in medial and lateral compartments. Early marginal spur formation about all 3 compartments. Normal alignment. There is a small effusion in the suprapatellar bursa. Negative for fracture. Extensive popliteal arterial calcifications. IMPRESSION: 1. Negative for fracture or other acute bone abnormality. 2. Chondrocalcinosis with early tricompartmental degenerative change and effusion, suggesting CPPD arthropathy. Electronically Signed   By: Lucrezia Europe M.D.   On: 06/25/2017 19:03   Ct Head Wo Contrast  Result Date: 06/20/2017 CLINICAL DATA:  Dizziness and pain following recent fall EXAM: CT HEAD WITHOUT CONTRAST CT CERVICAL SPINE WITHOUT CONTRAST TECHNIQUE: Multidetector CT imaging of the head and cervical spine was performed following the standard protocol without intravenous contrast. Multiplanar CT image  reconstructions of the cervical spine were also generated. COMPARISON:  CT head and CT cervical spine June 17, 2017 FINDINGS: CT HEAD FINDINGS Brain: There is persistent diffuse cerebral atrophy. There is no demonstrable mass, hemorrhage, extra-axial fluid collection, or midline shift. Widespread small vessel disease throughout the centra semiovale is stable. Small vessel disease is noted in each external capsule anteriorly. No acute infarct appreciable. Vascular: No hyperdense vessel. There is calcification in each carotid siphon and distal vertebral artery region. Skull: Bony calvarium appears intact. Fractures of the anterior left and right nasal bones remain stable compared to recent CT. No new fracture evident. Sinuses/Orbits: There is mucosal thickening in several ethmoid air cells bilaterally. There is a retention cyst in the inferior right maxillary antrum. There is a small retention cyst in the anterior left maxillary antrum. Orbits appear symmetric bilaterally. Other: Mastoid air cells are clear. CT CERVICAL SPINE FINDINGS Alignment: There is stable 2 mm retrolisthesis of C3 on C4. No new spondylolisthesis. Skull base and vertebrae: Skull base and craniocervical junction regions appear normal. There is mild pannus posterior to the odontoid which is not causing impression on the craniocervical junction. There is no demonstrable fracture. No blastic or lytic bone lesions. Soft tissues and spinal canal: Prevertebral soft tissues and predental space regions are normal. No paraspinous lesions are evident. There is no cord or canal hematoma. Disc levels: There is moderately severe disc space narrowing at C3-4. There is mild disc space narrowing at C5-6. There is facet hypertrophy at several levels without appreciable nerve root edema or effacement. No disc extrusion or stenosis. Upper chest: There is mild scarring in the lung apices. No upper lung zone edema or consolidation. Other: There is atherosclerotic  calcification in both subclavian arteries as well as in both carotid and vertebral arteries. Calcification appears fairly extensive in the extracranial carotid arteries. IMPRESSION: CT head: Diffuse atrophy with extensive supratentorial small vessel disease, unchanged compared to recent study. No mass, hemorrhage, or extra-axial fluid collection. Foci of arterial vascular calcification noted. Nasal bone fracture stable compared to recent study. Areas of  paranasal sinus disease noted. CT cervical spine: No fracture. Mild spondylolisthesis at C3-4 is felt to be due to underlying spondylosis. There is osteoarthritic changes several levels. There is extensive vascular calcification, most notably in the extracranial carotid arteries bilaterally. Electronically Signed   By: Lowella Grip III M.D.   On: 06/20/2017 11:04   Ct Cervical Spine Wo Contrast  Result Date: 06/20/2017 CLINICAL DATA:  Dizziness and pain following recent fall EXAM: CT HEAD WITHOUT CONTRAST CT CERVICAL SPINE WITHOUT CONTRAST TECHNIQUE: Multidetector CT imaging of the head and cervical spine was performed following the standard protocol without intravenous contrast. Multiplanar CT image reconstructions of the cervical spine were also generated. COMPARISON:  CT head and CT cervical spine June 17, 2017 FINDINGS: CT HEAD FINDINGS Brain: There is persistent diffuse cerebral atrophy. There is no demonstrable mass, hemorrhage, extra-axial fluid collection, or midline shift. Widespread small vessel disease throughout the centra semiovale is stable. Small vessel disease is noted in each external capsule anteriorly. No acute infarct appreciable. Vascular: No hyperdense vessel. There is calcification in each carotid siphon and distal vertebral artery region. Skull: Bony calvarium appears intact. Fractures of the anterior left and right nasal bones remain stable compared to recent CT. No new fracture evident. Sinuses/Orbits: There is mucosal thickening  in several ethmoid air cells bilaterally. There is a retention cyst in the inferior right maxillary antrum. There is a small retention cyst in the anterior left maxillary antrum. Orbits appear symmetric bilaterally. Other: Mastoid air cells are clear. CT CERVICAL SPINE FINDINGS Alignment: There is stable 2 mm retrolisthesis of C3 on C4. No new spondylolisthesis. Skull base and vertebrae: Skull base and craniocervical junction regions appear normal. There is mild pannus posterior to the odontoid which is not causing impression on the craniocervical junction. There is no demonstrable fracture. No blastic or lytic bone lesions. Soft tissues and spinal canal: Prevertebral soft tissues and predental space regions are normal. No paraspinous lesions are evident. There is no cord or canal hematoma. Disc levels: There is moderately severe disc space narrowing at C3-4. There is mild disc space narrowing at C5-6. There is facet hypertrophy at several levels without appreciable nerve root edema or effacement. No disc extrusion or stenosis. Upper chest: There is mild scarring in the lung apices. No upper lung zone edema or consolidation. Other: There is atherosclerotic calcification in both subclavian arteries as well as in both carotid and vertebral arteries. Calcification appears fairly extensive in the extracranial carotid arteries. IMPRESSION: CT head: Diffuse atrophy with extensive supratentorial small vessel disease, unchanged compared to recent study. No mass, hemorrhage, or extra-axial fluid collection. Foci of arterial vascular calcification noted. Nasal bone fracture stable compared to recent study. Areas of paranasal sinus disease noted. CT cervical spine: No fracture. Mild spondylolisthesis at C3-4 is felt to be due to underlying spondylosis. There is osteoarthritic changes several levels. There is extensive vascular calcification, most notably in the extracranial carotid arteries bilaterally. Electronically Signed    By: Lowella Grip III M.D.   On: 06/20/2017 11:04   Ct Abdomen Pelvis W Contrast  Result Date: 07/15/2017 CLINICAL DATA:  Abdominal distention EXAM: CT ABDOMEN AND PELVIS WITH CONTRAST TECHNIQUE: Multidetector CT imaging of the abdomen and pelvis was performed using the standard protocol following bolus administration of intravenous contrast. CONTRAST:  127mL ISOVUE-300 IOPAMIDOL (ISOVUE-300) INJECTION 61% COMPARISON:  Plain films earlier today.  CT 11/29/2016 FINDINGS: Lower chest: Small right pleural effusion. Dependent atelectasis in the lower lobes. Nodular appearing density in the right lower  lobe on image 7 measures up to 16 mm. This could reflect early infiltrate/pneumonia. This was not present on prior CT abdomen. Hepatobiliary: Scattered low densities in the liver compatible with cysts. Gallbladder unremarkable. No biliary ductal dilatation. Pancreas: No focal abnormality or ductal dilatation. Spleen: No focal abnormality.  Normal size. Adrenals/Urinary Tract: No adrenal mass. No hydronephrosis. Small cysts in the kidneys bilaterally. Foley catheter in the bladder which is decompressed. Stomach/Bowel: Left colonic diverticulosis. No active diverticulitis. Upper abdominal small bowel loops are mildly prominent, but there is no focal transition point. I favor this represents normal bowel gas pattern or slight ileus. Stomach is decompressed, unremarkable. Vascular/Lymphatic: Infrarenal abdominal aortic aneurysm, 4.7 cm maximally, stable since prior study. Extensive mural plaque. 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 No adenopathy. Reproductive: Marked prostate enlargement. Other: No free fluid or free air. Musculoskeletal: No acute bony abnormality. IMPRESSION: No evidence of bowel obstruction. 4.7 cm infrarenal abdominal aortic aneurysm. 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 Findings are stable. Left colonic diverticulosis.  No active diverticulitis. Marked  prostate enlargement. Small right pleural effusion. Nodular airspace opacity in the right lower lobe could reflect early infiltrate/pneumonia. Recommend follow-up CT after treatment to exclude pulmonary nodule. Electronically Signed   By: Rolm Baptise M.D.   On: 07/15/2017 13:31   US Renal  Result Date: 06/21/2017 CLINICAL DATA:  Oliguria. EXAM: RENAL / URINARY TRACT ULTRASOUND COMPLETE COMPARISON:  None. FINDINGS: Right Kidney: Length: 9.8 cm. Echogenicity is within normal limits. No mass or hydronephrosis visualized. Left Kidney: Length: 10.9 cm. Echogenicity within normal limits. No hydronephrosis visualized. 2.5 cm simple cyst is seen in lower pole. Bladder: Decompressed secondary to Foley catheter. Severely enlarged prostate gland is again noted, measuring 6.8 x 6.2 x 4.9 cm. IMPRESSION: No hydronephrosis or renal obstruction is noted. Severely enlarged prostate gland is again noted as described on prior CT scan. Electronically Signed   By: Marijo Conception, M.D.   On: 06/21/2017 09:24   Dg Knee Complete 4 Views Left  Result Date: 06/20/2017 CLINICAL DATA:  Fall.  Anterior knee pain, left greater than right. EXAM: LEFT KNEE - COMPLETE 4+ VIEW; RIGHT KNEE - COMPLETE 4+ VIEW COMPARISON:  None. FINDINGS: Mild degenerative changes are most evident in the medial and femoral compartments bilaterally. A small right-sided joint effusion is present. Chondrocalcinosis is present in the menisci bilaterally. Vascular calcifications are present. The joints are located bilaterally. No acute osseous abnormalities are present. IMPRESSION: 1. Bilateral chondrocalcinosis compatible with CPPD arthropathy. 2. Mild osseous degenerative changes in the medial and patellofemoral compartments bilaterally. 3. No acute osseous abnormality. 4. Extensive vascular calcifications.  Atherosclerosis. Electronically Signed   By: San Morelle M.D.   On: 06/20/2017 11:34   Dg Knee Complete 4 Views Right  Result Date:  06/20/2017 CLINICAL DATA:  Fall.  Anterior knee pain, left greater than right. EXAM: LEFT KNEE - COMPLETE 4+ VIEW; RIGHT KNEE - COMPLETE 4+ VIEW COMPARISON:  None. FINDINGS: Mild degenerative changes are most evident in the medial and femoral compartments bilaterally. A small right-sided joint effusion is present. Chondrocalcinosis is present in the menisci bilaterally. Vascular calcifications are present. The joints are located bilaterally. No acute osseous abnormalities are present. IMPRESSION: 1. Bilateral chondrocalcinosis compatible with CPPD arthropathy. 2. Mild osseous degenerative changes in the medial and patellofemoral compartments bilaterally. 3. No acute osseous abnormality. 4. Extensive vascular calcifications.  Atherosclerosis. Electronically Signed   By: San Morelle M.D.   On: 06/20/2017 11:34  Dg Abd 2 Views  Result Date: 07/15/2017 CLINICAL DATA:  Abdominal distention EXAM: ABDOMEN - 2 VIEW COMPARISON:  None. FINDINGS: Mild gaseous distention of small bowel loops. No free air organomegaly. No suspicious calcification. Vascular calcifications noted. IMPRESSION: Mild gaseous distention of small bowel loops. This could reflect early small bowel obstruction or mild ileus. Electronically Signed   By: Rolm Baptise M.D.   On: 07/15/2017 10:50    Microbiology: Recent Results (from the past 240 hour(s))  Urine culture     Status: None   Collection Time: 07/14/17  8:48 PM  Result Value Ref Range Status   Specimen Description   Final    URINE, RANDOM Performed at [facility] [address] [place], [place] [address]    Special Requests NONE  Final   Culture   Final    NO GROWTH Performed at [facility], [place] [address], [place], [place] [address]    Report Status 07/16/2017 FINAL  Final     Labs: Basic Metabolic Panel: Recent Labs  Lab 07/14/17 1744 07/15/17 0234 07/16/17 0152 07/18/17 0407  NA 138 137 139 141  K 4.1 4.0 3.8 3.7  CL 104  106 104 104  CO2 27 25 23 25   GLUCOSE 133* 112* 141* 130*  BUN 18 16 19  26*  CREATININE 0.89 0.77 0.93 0.95  CALCIUM 8.2* 7.6* 8.0* 8.2*  MG  --  1.6* 2.0 1.9   Liver Function Tests: Recent Labs  Lab 07/14/17 1744  AST 24  ALT 26  ALKPHOS 81  BILITOT 0.7  PROT 6.4*  ALBUMIN 2.3*   No results for input(s): LIPASE, AMYLASE in the last 168 hours. No results for input(s): AMMONIA in the last 168 hours. CBC: Recent Labs  Lab 07/14/17 1744  07/16/17 0152 07/16/17 1000 07/16/17 1819 07/17/17 0211 07/17/17 1000 07/18/17 0407  WBC 13.2*  --  8.5  --   --   --   --  9.9  NEUTROABS 10.5*  --   --   --   --   --   --   --   HGB 10.6*   < > 9.7* 9.1* 8.1* 8.8* 9.0* 9.1*  HCT 33.0*   < > 29.9* 28.1* 25.2* 27.0* 29.1* 28.2*  MCV 93.8  --  93.4  --   --   --   --  94.6  PLT 188  --  161  --   --   --   --  177   < > = values in this interval not displayed.   Cardiac Enzymes: No results for input(s): CKTOTAL, CKMB, CKMBINDEX, TROPONINI in the last 168 hours. BNP: BNP (last 3 results) Recent Labs    06/20/17 1033  BNP 806.8*    ProBNP (last 3 results) No results for input(s): PROBNP in the last 8760 hours.  CBG: No results for input(s): GLUCAP in the last 168 hours.     Signed:  Florencia Reasons MD, PhD  [facility] 07/18/2017, 4:24 PM

## 2017-07-19 ENCOUNTER — Encounter: Payer: Medicare Other | Admitting: Registered Nurse

## 2017-07-23 DIAGNOSIS — R369 Urethral discharge, unspecified: Secondary | ICD-10-CM | POA: Diagnosis not present

## 2017-07-23 DIAGNOSIS — T83098D Other mechanical complication of other indwelling urethral catheter, subsequent encounter: Secondary | ICD-10-CM | POA: Diagnosis not present

## 2017-07-23 DIAGNOSIS — R600 Localized edema: Secondary | ICD-10-CM | POA: Diagnosis not present

## 2017-07-23 DIAGNOSIS — R8290 Unspecified abnormal findings in urine: Secondary | ICD-10-CM | POA: Diagnosis not present

## 2017-07-24 DIAGNOSIS — I10 Essential (primary) hypertension: Secondary | ICD-10-CM | POA: Diagnosis not present

## 2017-07-24 DIAGNOSIS — Z5181 Encounter for therapeutic drug level monitoring: Secondary | ICD-10-CM | POA: Diagnosis not present

## 2017-07-24 DIAGNOSIS — R319 Hematuria, unspecified: Secondary | ICD-10-CM | POA: Diagnosis not present

## 2017-07-24 DIAGNOSIS — I2510 Atherosclerotic heart disease of native coronary artery without angina pectoris: Secondary | ICD-10-CM | POA: Diagnosis not present

## 2017-07-27 DIAGNOSIS — R600 Localized edema: Secondary | ICD-10-CM | POA: Diagnosis not present

## 2017-07-27 DIAGNOSIS — R339 Retention of urine, unspecified: Secondary | ICD-10-CM | POA: Diagnosis not present

## 2017-07-27 DIAGNOSIS — N401 Enlarged prostate with lower urinary tract symptoms: Secondary | ICD-10-CM | POA: Diagnosis not present

## 2017-07-27 DIAGNOSIS — M6281 Muscle weakness (generalized): Secondary | ICD-10-CM | POA: Diagnosis not present

## 2017-07-27 DIAGNOSIS — F0390 Unspecified dementia without behavioral disturbance: Secondary | ICD-10-CM | POA: Diagnosis not present

## 2017-07-30 ENCOUNTER — Emergency Department (HOSPITAL_COMMUNITY): Payer: Medicare Other

## 2017-07-30 ENCOUNTER — Encounter (HOSPITAL_COMMUNITY): Payer: Self-pay

## 2017-07-30 ENCOUNTER — Inpatient Hospital Stay (HOSPITAL_COMMUNITY)
Admission: EM | Admit: 2017-07-30 | Discharge: 2017-08-03 | DRG: 698 | Disposition: A | Discharge status: Hospice | Payer: Medicare Other | Attending: Internal Medicine | Admitting: Internal Medicine

## 2017-07-30 DIAGNOSIS — R0602 Shortness of breath: Secondary | ICD-10-CM | POA: Diagnosis not present

## 2017-07-30 DIAGNOSIS — N400 Enlarged prostate without lower urinary tract symptoms: Secondary | ICD-10-CM | POA: Diagnosis present

## 2017-07-30 DIAGNOSIS — E8809 Other disorders of plasma-protein metabolism, not elsewhere classified: Secondary | ICD-10-CM | POA: Diagnosis not present

## 2017-07-30 DIAGNOSIS — N390 Urinary tract infection, site not specified: Secondary | ICD-10-CM | POA: Diagnosis present

## 2017-07-30 DIAGNOSIS — I10 Essential (primary) hypertension: Secondary | ICD-10-CM | POA: Diagnosis present

## 2017-07-30 DIAGNOSIS — Z955 Presence of coronary angioplasty implant and graft: Secondary | ICD-10-CM

## 2017-07-30 DIAGNOSIS — I700 Atherosclerosis of aorta: Secondary | ICD-10-CM | POA: Diagnosis not present

## 2017-07-30 DIAGNOSIS — I248 Other forms of acute ischemic heart disease: Secondary | ICD-10-CM | POA: Diagnosis not present

## 2017-07-30 DIAGNOSIS — E039 Hypothyroidism, unspecified: Secondary | ICD-10-CM | POA: Diagnosis present

## 2017-07-30 DIAGNOSIS — R319 Hematuria, unspecified: Secondary | ICD-10-CM

## 2017-07-30 DIAGNOSIS — I2510 Atherosclerotic heart disease of native coronary artery without angina pectoris: Secondary | ICD-10-CM | POA: Diagnosis present

## 2017-07-30 DIAGNOSIS — R7989 Other specified abnormal findings of blood chemistry: Secondary | ICD-10-CM

## 2017-07-30 DIAGNOSIS — T83511A Infection and inflammatory reaction due to indwelling urethral catheter, initial encounter: Secondary | ICD-10-CM | POA: Diagnosis not present

## 2017-07-30 DIAGNOSIS — Y846 Urinary catheterization as the cause of abnormal reaction of the patient, or of later complication, without mention of misadventure at the time of the procedure: Secondary | ICD-10-CM | POA: Diagnosis present

## 2017-07-30 DIAGNOSIS — I482 Chronic atrial fibrillation: Secondary | ICD-10-CM | POA: Diagnosis present

## 2017-07-30 DIAGNOSIS — Z8249 Family history of ischemic heart disease and other diseases of the circulatory system: Secondary | ICD-10-CM

## 2017-07-30 DIAGNOSIS — Z515 Encounter for palliative care: Secondary | ICD-10-CM | POA: Diagnosis not present

## 2017-07-30 DIAGNOSIS — S60212A Contusion of left wrist, initial encounter: Secondary | ICD-10-CM | POA: Diagnosis not present

## 2017-07-30 DIAGNOSIS — R4182 Altered mental status, unspecified: Secondary | ICD-10-CM | POA: Diagnosis not present

## 2017-07-30 DIAGNOSIS — J449 Chronic obstructive pulmonary disease, unspecified: Secondary | ICD-10-CM | POA: Diagnosis present

## 2017-07-30 DIAGNOSIS — Z66 Do not resuscitate: Secondary | ICD-10-CM | POA: Diagnosis present

## 2017-07-30 DIAGNOSIS — Z801 Family history of malignant neoplasm of trachea, bronchus and lung: Secondary | ICD-10-CM

## 2017-07-30 DIAGNOSIS — C3431 Malignant neoplasm of lower lobe, right bronchus or lung: Secondary | ICD-10-CM | POA: Diagnosis not present

## 2017-07-30 DIAGNOSIS — Z6822 Body mass index (BMI) 22.0-22.9, adult: Secondary | ICD-10-CM

## 2017-07-30 DIAGNOSIS — S40012A Contusion of left shoulder, initial encounter: Secondary | ICD-10-CM | POA: Diagnosis not present

## 2017-07-30 DIAGNOSIS — I714 Abdominal aortic aneurysm, without rupture: Secondary | ICD-10-CM | POA: Diagnosis present

## 2017-07-30 DIAGNOSIS — F0390 Unspecified dementia without behavioral disturbance: Secondary | ICD-10-CM | POA: Diagnosis present

## 2017-07-30 DIAGNOSIS — C3490 Malignant neoplasm of unspecified part of unspecified bronchus or lung: Secondary | ICD-10-CM | POA: Diagnosis present

## 2017-07-30 DIAGNOSIS — I480 Paroxysmal atrial fibrillation: Secondary | ICD-10-CM | POA: Diagnosis present

## 2017-07-30 DIAGNOSIS — G9341 Metabolic encephalopathy: Secondary | ICD-10-CM | POA: Diagnosis not present

## 2017-07-30 DIAGNOSIS — E43 Unspecified severe protein-calorie malnutrition: Secondary | ICD-10-CM | POA: Diagnosis present

## 2017-07-30 DIAGNOSIS — M79602 Pain in left arm: Secondary | ICD-10-CM | POA: Diagnosis present

## 2017-07-30 DIAGNOSIS — R778 Other specified abnormalities of plasma proteins: Secondary | ICD-10-CM

## 2017-07-30 DIAGNOSIS — E440 Moderate protein-calorie malnutrition: Secondary | ICD-10-CM | POA: Diagnosis present

## 2017-07-30 DIAGNOSIS — R627 Adult failure to thrive: Secondary | ICD-10-CM | POA: Diagnosis present

## 2017-07-30 DIAGNOSIS — Z8701 Personal history of pneumonia (recurrent): Secondary | ICD-10-CM

## 2017-07-30 DIAGNOSIS — Z7982 Long term (current) use of aspirin: Secondary | ICD-10-CM

## 2017-07-30 DIAGNOSIS — Z85828 Personal history of other malignant neoplasm of skin: Secondary | ICD-10-CM

## 2017-07-30 DIAGNOSIS — L8990 Pressure ulcer of unspecified site, unspecified stage: Secondary | ICD-10-CM

## 2017-07-30 DIAGNOSIS — C3491 Malignant neoplasm of unspecified part of right bronchus or lung: Secondary | ICD-10-CM

## 2017-07-30 DIAGNOSIS — M25522 Pain in left elbow: Secondary | ICD-10-CM | POA: Diagnosis not present

## 2017-07-30 DIAGNOSIS — F17210 Nicotine dependence, cigarettes, uncomplicated: Secondary | ICD-10-CM | POA: Diagnosis present

## 2017-07-30 DIAGNOSIS — S59902A Unspecified injury of left elbow, initial encounter: Secondary | ICD-10-CM | POA: Diagnosis not present

## 2017-07-30 DIAGNOSIS — Z9181 History of falling: Secondary | ICD-10-CM

## 2017-07-30 DIAGNOSIS — R05 Cough: Secondary | ICD-10-CM | POA: Diagnosis not present

## 2017-07-30 LAB — URINALYSIS, ROUTINE W REFLEX MICROSCOPIC
Bilirubin Urine: NEGATIVE
GLUCOSE, UA: NEGATIVE mg/dL
Ketones, ur: NEGATIVE mg/dL
Nitrite: NEGATIVE
PH: 7 (ref 5.0–8.0)
Protein, ur: NEGATIVE mg/dL
SQUAMOUS EPITHELIAL / LPF: NONE SEEN
Specific Gravity, Urine: 1.011 (ref 1.005–1.030)

## 2017-07-30 LAB — DIGOXIN LEVEL: DIGOXIN LVL: 1 ng/mL (ref 0.8–2.0)

## 2017-07-30 LAB — CBC WITH DIFFERENTIAL/PLATELET
BASOS ABS: 0 10*3/uL (ref 0.0–0.1)
BASOS PCT: 0 %
EOS ABS: 0.1 10*3/uL (ref 0.0–0.7)
EOS PCT: 1 %
HCT: 31.4 % — ABNORMAL LOW (ref 39.0–52.0)
Hemoglobin: 9.6 g/dL — ABNORMAL LOW (ref 13.0–17.0)
Lymphocytes Relative: 10 %
Lymphs Abs: 0.9 10*3/uL (ref 0.7–4.0)
MCH: 28.6 pg (ref 26.0–34.0)
MCHC: 30.6 g/dL (ref 30.0–36.0)
MCV: 93.5 fL (ref 78.0–100.0)
MONO ABS: 0.8 10*3/uL (ref 0.1–1.0)
Monocytes Relative: 8 %
Neutro Abs: 7.9 10*3/uL — ABNORMAL HIGH (ref 1.7–7.7)
Neutrophils Relative %: 81 %
PLATELETS: 222 10*3/uL (ref 150–400)
RBC: 3.36 MIL/uL — ABNORMAL LOW (ref 4.22–5.81)
RDW: 16.5 % — AB (ref 11.5–15.5)
WBC: 9.7 10*3/uL (ref 4.0–10.5)

## 2017-07-30 LAB — COMPREHENSIVE METABOLIC PANEL
ALT: 25 U/L (ref 17–63)
AST: 31 U/L (ref 15–41)
Albumin: 1.8 g/dL — ABNORMAL LOW (ref 3.5–5.0)
Alkaline Phosphatase: 56 U/L (ref 38–126)
Anion gap: 10 (ref 5–15)
BUN: 26 mg/dL — AB (ref 6–20)
CHLORIDE: 100 mmol/L — AB (ref 101–111)
CO2: 30 mmol/L (ref 22–32)
Calcium: 8.3 mg/dL — ABNORMAL LOW (ref 8.9–10.3)
Creatinine, Ser: 1.14 mg/dL (ref 0.61–1.24)
GFR calc Af Amer: >60 mL/min (ref >60)
GFR, EST NON AFRICAN AMERICAN: 55 mL/min — AB (ref >60)
Glucose, Bld: 132 mg/dL — ABNORMAL HIGH (ref 65–99)
POTASSIUM: 4.1 mmol/L (ref 3.5–5.1)
SODIUM: 140 mmol/L (ref 135–145)
Total Bilirubin: 0.6 mg/dL (ref 0.3–1.2)
Total Protein: 5.8 g/dL — ABNORMAL LOW (ref 6.5–8.1)

## 2017-07-30 LAB — VITAMIN B12: VITAMIN B 12: 175 pg/mL — AB (ref 180–914)

## 2017-07-30 LAB — TROPONIN I
Troponin I: 0.06 ng/mL (ref <0.03)
Troponin I: 0.06 ng/mL (ref <0.03)

## 2017-07-30 LAB — PREALBUMIN: PREALBUMIN: 6.8 mg/dL — AB (ref 18–38)

## 2017-07-30 LAB — I-STAT CG4 LACTIC ACID, ED
LACTIC ACID, VENOUS: 1.23 mmol/L (ref 0.5–1.9)
Lactic Acid, Venous: 0.65 mmol/L (ref 0.5–1.9)

## 2017-07-30 LAB — AMMONIA: Ammonia: <9 umol/L — ABNORMAL LOW (ref 9–35)

## 2017-07-30 LAB — T4, FREE: Free T4: 0.95 ng/dL (ref 0.61–1.12)

## 2017-07-30 LAB — TSH: TSH: 7.424 u[IU]/mL — AB (ref 0.350–4.500)

## 2017-07-30 LAB — BRAIN NATRIURETIC PEPTIDE: B Natriuretic Peptide: 826.1 pg/mL — ABNORMAL HIGH (ref 0.0–100.0)

## 2017-07-30 MED ORDER — DIGOXIN 125 MCG PO TABS
0.1250 mg | ORAL_TABLET | Freq: Every day | ORAL | Status: DC
  Administered 2017-07-31 – 2017-08-03 (×4): 0.125 mg via ORAL
  Filled 2017-07-30 (×4): qty 1

## 2017-07-30 MED ORDER — QUETIAPINE FUMARATE 25 MG PO TABS
25.0000 mg | ORAL_TABLET | Freq: Every day | ORAL | Status: DC
  Administered 2017-07-30 – 2017-08-02 (×4): 25 mg via ORAL
  Filled 2017-07-30 (×4): qty 1

## 2017-07-30 MED ORDER — ADULT MULTIVITAMIN W/MINERALS CH
1.0000 | ORAL_TABLET | Freq: Every day | ORAL | Status: DC
  Administered 2017-07-30 – 2017-08-01 (×3): 1 via ORAL
  Filled 2017-07-30 (×3): qty 1

## 2017-07-30 MED ORDER — SODIUM CHLORIDE 0.9 % IV SOLN
1.0000 g | INTRAVENOUS | Status: DC
  Administered 2017-07-30 – 2017-07-31 (×2): 1 g via INTRAVENOUS
  Filled 2017-07-30 (×2): qty 10

## 2017-07-30 MED ORDER — ASPIRIN EC 81 MG PO TBEC
81.0000 mg | DELAYED_RELEASE_TABLET | Freq: Every day | ORAL | Status: DC
  Administered 2017-07-31 – 2017-08-01 (×2): 81 mg via ORAL
  Filled 2017-07-30 (×2): qty 1

## 2017-07-30 MED ORDER — ENOXAPARIN SODIUM 40 MG/0.4ML ~~LOC~~ SOLN
40.0000 mg | SUBCUTANEOUS | Status: DC
  Administered 2017-07-30 – 2017-08-02 (×4): 40 mg via SUBCUTANEOUS
  Filled 2017-07-30 (×4): qty 0.4

## 2017-07-30 MED ORDER — ONDANSETRON HCL 4 MG/2ML IJ SOLN: 4.0000 mg | Freq: Four times a day (QID) | INTRAMUSCULAR | Status: DC | PRN

## 2017-07-30 MED ORDER — MEGESTROL ACETATE 400 MG/10ML PO SUSP
400.0000 mg | Freq: Two times a day (BID) | ORAL | Status: DC
  Administered 2017-07-31 – 2017-08-01 (×3): 400 mg via ORAL
  Filled 2017-07-30 (×4): qty 10

## 2017-07-30 MED ORDER — DUTASTERIDE 0.5 MG PO CAPS
0.5000 mg | ORAL_CAPSULE | Freq: Every day | ORAL | Status: DC
  Administered 2017-07-31 – 2017-08-03 (×4): 0.5 mg via ORAL
  Filled 2017-07-30 (×4): qty 1

## 2017-07-30 MED ORDER — SODIUM CHLORIDE 0.9% FLUSH
3.0000 mL | Freq: Two times a day (BID) | INTRAVENOUS | Status: DC
  Administered 2017-07-30 – 2017-08-03 (×8): 3 mL via INTRAVENOUS

## 2017-07-30 MED ORDER — DILTIAZEM HCL 60 MG PO TABS
60.0000 mg | ORAL_TABLET | Freq: Three times a day (TID) | ORAL | Status: DC
  Administered 2017-07-31 – 2017-08-03 (×9): 60 mg via ORAL
  Filled 2017-07-30 (×10): qty 1

## 2017-07-30 MED ORDER — SODIUM CHLORIDE 0.9 % IV SOLN
INTRAVENOUS | Status: DC
  Administered 2017-07-30: 16:00:00 via INTRAVENOUS

## 2017-07-30 MED ORDER — ACETAMINOPHEN 325 MG PO TABS: 650.0000 mg | ORAL_TABLET | Freq: Four times a day (QID) | ORAL | Status: DC | PRN

## 2017-07-30 MED ORDER — TAMSULOSIN HCL 0.4 MG PO CAPS
0.4000 mg | ORAL_CAPSULE | Freq: Every day | ORAL | Status: DC
  Administered 2017-07-31 – 2017-08-03 (×4): 0.4 mg via ORAL
  Filled 2017-07-30 (×4): qty 1

## 2017-07-30 MED ORDER — FUROSEMIDE 40 MG PO TABS
40.0000 mg | ORAL_TABLET | Freq: Two times a day (BID) | ORAL | Status: DC
  Administered 2017-07-31 – 2017-08-03 (×7): 40 mg via ORAL
  Filled 2017-07-30 (×7): qty 1

## 2017-07-30 MED ORDER — ATORVASTATIN CALCIUM 10 MG PO TABS
10.0000 mg | ORAL_TABLET | Freq: Every day | ORAL | Status: DC
  Administered 2017-07-30 – 2017-07-31 (×2): 10 mg via ORAL
  Filled 2017-07-30 (×2): qty 1

## 2017-07-30 MED ORDER — DUTASTERIDE-TAMSULOSIN HCL 0.5-0.4 MG PO CAPS: 1.0000 | ORAL_CAPSULE | Freq: Every day | ORAL | Status: DC

## 2017-07-30 MED ORDER — IOPAMIDOL (ISOVUE-370) INJECTION 76%
INTRAVENOUS | Status: AC
  Administered 2017-07-30: 100 mL via INTRAVENOUS
  Filled 2017-07-30: qty 100

## 2017-07-30 MED ORDER — SENNOSIDES-DOCUSATE SODIUM 8.6-50 MG PO TABS
1.0000 | ORAL_TABLET | Freq: Two times a day (BID) | ORAL | Status: DC
  Administered 2017-07-30 – 2017-08-02 (×7): 1 via ORAL
  Filled 2017-07-30 (×7): qty 1

## 2017-07-30 MED ORDER — ONDANSETRON HCL 4 MG PO TABS: 4.0000 mg | ORAL_TABLET | Freq: Four times a day (QID) | ORAL | Status: DC | PRN

## 2017-07-30 MED ORDER — FOLIC ACID 1 MG PO TABS
1.0000 mg | ORAL_TABLET | Freq: Every day | ORAL | Status: DC
  Administered 2017-07-30 – 2017-08-01 (×3): 1 mg via ORAL
  Filled 2017-07-30 (×3): qty 1

## 2017-07-30 MED ORDER — AMIODARONE HCL 200 MG PO TABS
200.0000 mg | ORAL_TABLET | Freq: Every day | ORAL | Status: DC
  Administered 2017-07-31 – 2017-08-03 (×4): 200 mg via ORAL
  Filled 2017-07-30 (×4): qty 1

## 2017-07-30 MED ORDER — METOPROLOL TARTRATE 50 MG PO TABS
50.0000 mg | ORAL_TABLET | Freq: Two times a day (BID) | ORAL | Status: DC
  Administered 2017-07-30 – 2017-08-03 (×8): 50 mg via ORAL
  Filled 2017-07-30 (×8): qty 1

## 2017-07-30 MED ORDER — POLYETHYLENE GLYCOL 3350 17 G PO PACK
17.0000 g | PACK | Freq: Every day | ORAL | Status: DC
  Administered 2017-07-30 – 2017-08-02 (×4): 17 g via ORAL
  Filled 2017-07-30 (×4): qty 1

## 2017-07-30 MED ORDER — ACETAMINOPHEN 650 MG RE SUPP: 650.0000 mg | Freq: Four times a day (QID) | RECTAL | Status: DC | PRN

## 2017-07-30 MED ORDER — ENSURE ENLIVE PO LIQD
237.0000 mL | Freq: Three times a day (TID) | ORAL | Status: DC
  Administered 2017-07-30 – 2017-08-03 (×13): 237 mL via ORAL
  Filled 2017-07-30: qty 237

## 2017-07-30 MED ORDER — CLONAZEPAM 0.5 MG PO TABS: 0.2500 mg | ORAL_TABLET | Freq: Three times a day (TID) | ORAL | Status: DC | PRN

## 2017-07-30 MED ORDER — CLONAZEPAM 0.125 MG PO TBDP: 0.2500 mg | ORAL_TABLET | Freq: Three times a day (TID) | ORAL | Status: DC | PRN

## 2017-07-30 NOTE — ED Provider Notes (Signed)
[facility] EMERGENCY DEPARTMENT Provider Note   CSN: [id number] Arrival date & time: 07/30/17  1401     History   Chief Complaint Chief Complaint  Patient presents with  . Leg Swelling    HPI REO PORTELA is a 84 y.o. male.  HPI  The patient is an 84 year old male, he has a known history of dementia, high blood pressure, COPD, atrial fibrillation on anticoagulation, recently admitted to the hospital because of hematuria, this was causing Foley catheter complications, the Foley catheter was placed because of urinary retention during a prior admission.  Evidently the patient had been pulling on his Foley catheter which caused some bleeding.  There was some increased confusion.  He was admitted to the hospital and evidently stayed for 4 days   Pertinent medical history includes a recent admission in November 2018 when the patient had a percutaneous coronary intervention to the left anterior descending.  The Plavix was discontinued to decrease the risk of bleeding but Xarelto was continued.  He had a fall striking his face, had some bleeding, he went to [facility], ultimately he has been placed in a longer term rehab facility where he currently is.  The family reports that while he is in this facility he has had several days of worsening swelling of the legs, he has not been walking as much though he was able to ambulate with assistance.  He has been complaining of bilateral lower extremity pain and the swelling was treated with dressings and Coban wraps.  During the most recent hospitalization he was treated for a community-acquired pneumonia, had Rocephin and Zithromax, repeat CT of the chest was requested as an outpatient to make sure that this had resolved and to rule out underlying malignancy.  Xarelto was discontinued, continuing digoxin Cardizem metoprolol amiodarone and baby aspirin.  There was a comment about progressive dementia, I question whether this is  truly dementia versus a delirium, that being said the family states he has not really improved since his initial fall over a month ago.  The patient is not able to give much in the way of information, level 5 caveat applies.  The family does report that he had a fall in the nursing facility landing on his left side, he has not had evaluation of his left arm since the fall which she complains of with pain.  Past Medical History:  Diagnosis Date  . Abdominal aortic aneurysm (AAA) ([place])   . Arthritis    "shoulders" (04/24/2017)  . Atrial fibrillation with RVR ([place]) 03/2017   [name] 03/08/2017  . COPD (chronic obstructive pulmonary disease) ([place])   . Coronary artery disease   . Dementia   . Fall at home 06/17/2017   nasal fracture  . High cholesterol   . HOH (hard of hearing)   . Hypertension   . Pneumonia ~ 2003-2008 X 5   "5 straight years S/P pneumonia shot in ~ 2003"(04/24/2017)  . Skin cancer 2018   "cut it out of right arm"    Patient Active Problem List   Diagnosis Date Noted  . Hematuria 07/15/2017  . Chronic anticoagulation 07/14/2017  . HTN (hypertension) 07/14/2017  . Pain   . Debility 06/25/2017  . Chronic obstructive pulmonary disease ([place])   . Bilateral hearing loss   . Leukocytosis   . Gross hematuria   . Acute blood loss anemia   . Hypoalbuminemia   . Malnutrition of moderate degree 06/24/2017  . Dementia 06/21/2017  .  Hyperglycemia 06/21/2017  . BPH (benign prostatic hyperplasia) 06/21/2017  . Dizziness 06/21/2017  . Atrial fibrillation with rapid ventricular response ([place]) 06/20/2017  . Atrial fibrillation ([place]) 06/20/2017  . Post PTCA 04/24/2017  . CAD (coronary artery disease), native coronary artery 04/22/2017  . Angina pectoris ([place]) 04/22/2017  . PAF (paroxysmal atrial fibrillation) ([place]) 03/18/2017  . Abnormal nuclear stress test 03/18/2017    Past Surgical History:  Procedure Laterality Date  . APPENDECTOMY    . CARDIAC CATHETERIZATION    .  CATARACT EXTRACTION, BILATERAL Bilateral   . CORONARY ANGIOPLASTY WITH STENT PLACEMENT    . CORONARY ATHERECTOMY N/A 04/24/2017   Procedure: CORONARY ATHERECTOMY;  Surgeon: Adrian Prows, MD;  Location: [facility];  Service: Cardiovascular;  Laterality: N/A;  . CORONARY STENT INTERVENTION N/A 04/24/2017   Procedure: CORONARY STENT INTERVENTION;  Surgeon: Adrian Prows, MD;  Location: [facility];  Service: Cardiovascular;  Laterality: N/A;  . INCISION AND DRAINAGE  1950s   "RLE; got hurt in service; had to get a bunch of stuff out"  . LEFT HEART CATH AND CORONARY ANGIOGRAPHY N/A 03/19/2017   Procedure: LEFT HEART CATH AND CORONARY ANGIOGRAPHY;  Surgeon: Adrian Prows, MD;  Location: [facility];  Service: Cardiovascular;  Laterality: N/A;  . SKIN CANCER EXCISION Right 2018   "arms"  . TOOTH EXTRACTION         Home Medications    Prior to Admission medications   Medication Sig Start Date End Date Taking? Authorizing Provider  acetaminophen (TYLENOL) 325 MG tablet Take 1-2 tablets (325-650 mg total) by mouth every 4 (four) hours as needed for mild pain. 07/09/17   Love, Ivan Anchors, PA-C  amiodarone (PACERONE) 200 MG tablet Take 1 tablet (200 mg total) by mouth daily. 07/10/17   Love, Ivan Anchors, PA-C  aspirin EC 81 MG tablet Take 1 tablet (81 mg total) by mouth daily. 07/18/17 07/18/18  Florencia Reasons, MD  atorvastatin (LIPITOR) 10 MG tablet Take 1 tablet (10 mg total) by mouth daily. 07/10/17   Love, Ivan Anchors, PA-C  clonazePAM (KLONOPIN) 0.5 MG tablet Take 0.5 tablets (0.25 mg total) by mouth 3 (three) times daily as needed (severe agitation). 07/18/17   Florencia Reasons, MD  digoxin (LANOXIN) 0.125 MG tablet Take 1 tablet (0.125 mg total) by mouth daily. 07/10/17   Love, Ivan Anchors, PA-C  diltiazem (CARDIZEM) 60 MG tablet Take 1 tablet (60 mg total) by mouth every 8 (eight) hours. 07/10/17   Love, Ivan Anchors, PA-C  diphenhydrAMINE (BENADRYL) 25 mg capsule Take 1 capsule (25 mg total) by mouth every 6 (six)  hours as needed for itching or sleep (agitation). 07/18/17   Florencia Reasons, MD  Dutasteride-Tamsulosin HCl (JALYN) 0.5-0.4 MG CAPS Take 1 tablet by mouth daily. 07/10/17   Love, Ivan Anchors, PA-C  feeding supplement, ENSURE ENLIVE, (ENSURE ENLIVE) LIQD Take 237 mLs by mouth 4 (four) times daily -  with meals and at bedtime. Patient not taking: Reported on 07/14/2017 07/09/17   Love, Ivan Anchors, PA-C  megestrol (MEGACE) 400 MG/10ML suspension Take 10 mLs (400 mg total) by mouth 2 (two) times daily. 07/10/17   Love, Ivan Anchors, PA-C  metoprolol tartrate (LOPRESSOR) 50 MG tablet Take 1 tablet (50 mg total) by mouth 2 (two) times daily. 06/25/17   Raiford Noble Latif, DO  Multiple Vitamin (MULTIVITAMIN WITH MINERALS) TABS tablet Take 1 tablet by mouth daily. 07/10/17   Love, Ivan Anchors, PA-C  polyethylene glycol (MIRALAX / Floria Raveling) packet Take  17 g by mouth daily. Patient not taking: Reported on 07/14/2017 06/26/17   Raiford Noble Latif, DO  QUEtiapine (SEROQUEL) 25 MG tablet Take 1 tablet (25 mg total) by mouth daily at 8 pm. 07/10/17   Love, Ivan Anchors, PA-C  senna-docusate (SENOKOT-S) 8.6-50 MG tablet Take 1 tablet by mouth 2 (two) times daily. 07/18/17   Florencia Reasons, MD    Family History Family History  Problem Relation Age of Onset  . Heart attack Father   . Cancer Sister   . Lung cancer Brother     Social History Social History   Tobacco Use  . Smoking status: Current Every Day Smoker    Packs/day: 0.10    Years: 65.00    Pack years: 6.50    Types: Cigarettes  . Smokeless tobacco: Former Systems developer  . Tobacco comment: smokes cigarettes about 2/day now  Substance Use Topics  . Alcohol use: No  . Drug use: No     Allergies   Phenergan [promethazine hcl]   Review of Systems Review of Systems  Unable to perform ROS: Mental status change     Physical Exam Updated Vital Signs BP 116/74   Pulse 69   Temp 98.1 F (36.7 C) (Oral)   Resp 20   Ht 5\' 9"  (1.753 m)   Wt 69.9 kg (154 lb)   SpO2 100%   BMI 22.74  kg/m   Physical Exam  Constitutional: He appears well-developed and well-nourished. No distress.  HENT:  Head: Normocephalic and atraumatic.  Mouth/Throat: No oropharyngeal exudate.  Mucous membranes dehydrated  Eyes: Conjunctivae and EOM are normal. Pupils are equal, round, and reactive to light. Right eye exhibits no discharge. Left eye exhibits no discharge. No scleral icterus.  Neck: Normal range of motion. Neck supple. No JVD present. No thyromegaly present.  Cardiovascular: Normal rate, normal heart sounds and intact distal pulses. Exam reveals no gallop and no friction rub.  No murmur heard. Slightly irregular rhythm  Pulmonary/Chest: Effort normal and breath sounds normal. No respiratory distress. He has no wheezes. He has no rales.  No increased work of breathing, clear lung sounds, no distress  Abdominal: Soft. Bowel sounds are normal. He exhibits no distension and no mass. There is no tenderness.  Diffusely nontender abdomen  Musculoskeletal: Normal range of motion. He exhibits no edema or tenderness.  Legs unwrapped, there is not appear to be any significant wounds, there is a slight open wound to the right proximal lower extremity just distal to the knee anteriorly.  There is a small amount of blood around this but no purulence or surrounding erythema or tenderness.  There is mild pitting edema to the bilateral ankles but no significant edema to the legs in general.  There is decreased range of motion of the bilateral lower extremities at the hips knees and ankles.  The compartments are diffusely soft  Lymphadenopathy:    He has no cervical adenopathy.  Neurological: He is alert. Coordination normal.  The patient is able to follow commands, he appears generally weak, he does not answer questions with any level of orientation but is able to tell me his name and who the people in the room are.  Skin: Skin is warm and dry. No rash noted. No erythema.  Psychiatric: He has a normal  mood and affect. His behavior is normal.  Nursing note and vitals reviewed.    ED Treatments / Results  Labs (all labs ordered are listed, but only abnormal results are displayed) Labs  Reviewed - No data to display  ED ECG REPORT  I personally interpreted this EKG   Date: 07/30/2017   Rate: 75  Rhythm: atrial fibrillation  QRS Axis: normal  Intervals: normal  ST/T Wave abnormalities: nonspecific T wave changes  Conduction Disutrbances:none  Narrative Interpretation:   Old EKG Reviewed: none available  Radiology No results found.  Procedures Procedures (including critical care time)  Medications Ordered in ED Medications - No data to display   Initial Impression / Assessment and Plan / ED Course  I have reviewed the triage vital signs and the nursing notes.  Pertinent labs & imaging results that were available during my care of the patient were reviewed by me and considered in my medical decision making (see chart for details).  Clinical Course as of Jul 30 1837  Mon Jul 30, 2017  1710 TSH: (!) 7.424 [BM]  1711 Hemoglobin: (!) 9.6 [BM]  1711 BUN: (!) 26 [BM]  1711 Albumin: (!) 1.8 [BM]    Clinical Course User Index [BM] Noemi Chapel, MD   Because of the patient's altered mental status is unclear though it seems to be somewhat chronic worse at least subacute, he has multiple concerns from the family including leg swelling and no improvement in his mental status which they considered to be possibly related to urinary infection, there is no definite etiology to this at this time.  Will order CT scan, urinalysis, labs, recheck the chest to make sure pneumonia has resolved, electrolytes, at this time I do not think that an ultrasound of the legs is warranted given the bilateral symmetrical swelling of the legs making a DVT less likely.  Has multiple lab abnormalities as demonstrated above.  Hemoglobin is at baseline, the albumin is severely depressed, the TSH is elevated  consistent with a hypothyroid state, the patient does not have any history of thyroid dysfunction but being on amiodarone does raise some concern.  The urinalysis shows too numerous to count white blood cells, red blood cells and bacteria.  Nitrite negative.  No leukocytosis, no significant renal dysfunction  CT scan of the chest does show that he has a new evidenced lung cancer on the right side.  The patient has had progressive generalized weakness that does make me concerned that this could be related to his thyroid however his troponin is also slightly elevated at 0.06.  Will admit to the hospital for multiple reasons including generalized weakness, elevated troponin, UTI and hypothyroidism.  D/w Dr. Posey Pronto who will admit  Final Clinical Impressions(s) / ED Diagnoses   Final diagnoses:  Hypothyroidism, unspecified type  Hypoalbuminemia  Elevated troponin  Malignant neoplasm of right lung, unspecified part of lung ([place])  Urinary tract infection with hematuria, site unspecified      Noemi Chapel, MD 07/30/17 Drema Halon

## 2017-07-30 NOTE — H&P (Signed)
[facility] History and Physical   Patient: Jeremy Macdonald EVO:[id number]   PCP: Vernie Shanks, MD DOB: 14-Sep-1925   DOA: 07/30/2017   DOS: 07/30/2017   DOS: the patient was seen and examined on 07/30/2017   Patient coming from: The patient is coming from SNF  Chief Complaint: leg edema  HPI: Jeremy Macdonald is a 84 y.o. male with Past medical history of A. fib, CAD S/P PCI 1118, COPD, dementia, recurrent fall, hematuria with chronic indwelling Foley catheter. Patient was brought in due to worsening leg swelling.  Family noted this 5 days ago.  Patient was started on oral Lasix as well as Haematologist. Patient also had confusion.  Reported by family the patient was headache delusions and was trying to pull things from the air.  Did not have any auditory or visual hallucination. 1 week ago patient had a fall at the nursing home as well no workup was performed per family. Petra Kuba of the fall is not been unclear. Patient at the time of my evaluation denies any acute complaint no nausea no vomiting no breathing. No diarrhea no constipation reported. Patient has hematuria as well as chronic indwelling Foley catheter but currently no hematuria.  ED Course: Visited with above complaint, mild elevation of troponin, mild elevation of the TSH.  Leg swelling present.  Urine appears to be infected.  Patient was referred for admission for multiple issues.  At his baseline ambulates with support And is dependent for most of his ADL; does not manages his medication on his own.  Review of Systems: as mentioned in the history of present illness.  All other systems reviewed and are negative.  Past Medical History:  Diagnosis Date  . Abdominal aortic aneurysm (AAA) ([place])   . Arthritis    "shoulders" (04/24/2017)  . Atrial fibrillation with RVR ([place]) 03/2017   [name] 03/08/2017  . COPD (chronic obstructive pulmonary disease) ([place])   . Coronary artery disease   . Dementia   . Fall at home  06/17/2017   nasal fracture  . High cholesterol   . HOH (hard of hearing)   . Hypertension   . Pneumonia ~ 2003-2008 X 5   "5 straight years S/P pneumonia shot in ~ 2003"(04/24/2017)  . Skin cancer 2018   "cut it out of right arm"   Past Surgical History:  Procedure Laterality Date  . APPENDECTOMY    . CARDIAC CATHETERIZATION    . CATARACT EXTRACTION, BILATERAL Bilateral   . CORONARY ANGIOPLASTY WITH STENT PLACEMENT    . CORONARY ATHERECTOMY N/A 04/24/2017   Procedure: CORONARY ATHERECTOMY;  Surgeon: Adrian Prows, MD;  Location: [facility];  Service: Cardiovascular;  Laterality: N/A;  . CORONARY STENT INTERVENTION N/A 04/24/2017   Procedure: CORONARY STENT INTERVENTION;  Surgeon: Adrian Prows, MD;  Location: [facility];  Service: Cardiovascular;  Laterality: N/A;  . INCISION AND DRAINAGE  1950s   "RLE; got hurt in service; had to get a bunch of stuff out"  . LEFT HEART CATH AND CORONARY ANGIOGRAPHY N/A 03/19/2017   Procedure: LEFT HEART CATH AND CORONARY ANGIOGRAPHY;  Surgeon: Adrian Prows, MD;  Location: [facility];  Service: Cardiovascular;  Laterality: N/A;  . SKIN CANCER EXCISION Right 2018   "arms"  . TOOTH EXTRACTION     Social History:  reports that he has been smoking cigarettes.  He has a 6.50 pack-year smoking history. He has quit using smokeless tobacco. He reports that he does not drink  alcohol or use drugs.  Allergies  Allergen Reactions  . Phenergan [Promethazine Hcl] Other (See Comments)    Confusion    Family History  Problem Relation Age of Onset  . Heart attack Father   . Cancer Sister   . Lung cancer Brother      Prior to Admission medications   Medication Sig Start Date End Date Taking? Authorizing Provider  acetaminophen (TYLENOL) 325 MG tablet Take 1-2 tablets (325-650 mg total) by mouth every 4 (four) hours as needed for mild pain. 07/09/17  Yes Love, Ivan Anchors, PA-C  amiodarone (PACERONE) 200 MG tablet Take 1 tablet (200 mg total)  by mouth daily. 07/10/17  Yes Love, Ivan Anchors, PA-C  aspirin EC 81 MG tablet Take 1 tablet (81 mg total) by mouth daily. 07/18/17 07/18/18 Yes Florencia Reasons, MD  atorvastatin (LIPITOR) 10 MG tablet Take 1 tablet (10 mg total) by mouth daily. 07/10/17  Yes Love, Ivan Anchors, PA-C  clonazePAM (KLONOPIN) 0.5 MG tablet Take 0.5 tablets (0.25 mg total) by mouth 3 (three) times daily as needed (severe agitation). 07/18/17  Yes Florencia Reasons, MD  digoxin (LANOXIN) 0.125 MG tablet Take 1 tablet (0.125 mg total) by mouth daily. 07/10/17  Yes Love, Ivan Anchors, PA-C  diltiazem (CARDIZEM) 60 MG tablet Take 1 tablet (60 mg total) by mouth every 8 (eight) hours. 07/10/17  Yes Love, Ivan Anchors, PA-C  diphenhydrAMINE (BENADRYL) 25 mg capsule Take 1 capsule (25 mg total) by mouth every 6 (six) hours as needed for itching or sleep (agitation). 07/18/17  Yes Florencia Reasons, MD  Dutasteride-Tamsulosin HCl (JALYN) 0.5-0.4 MG CAPS Take 1 tablet by mouth daily. 07/10/17  Yes Love, Ivan Anchors, PA-C  feeding supplement, ENSURE ENLIVE, (ENSURE ENLIVE) LIQD Take 237 mLs by mouth 4 (four) times daily -  with meals and at bedtime. 07/09/17  Yes Love, Ivan Anchors, PA-C  furosemide (LASIX) 40 MG tablet Take 40 mg by mouth 2 (two) times daily.   Yes [provider]  megestrol (MEGACE) 400 MG/10ML suspension Take 10 mLs (400 mg total) by mouth 2 (two) times daily. 07/10/17  Yes Love, Ivan Anchors, PA-C  Melatonin 3 MG TABS Take 3 mg by mouth at bedtime.   Yes [provider]  metoprolol tartrate (LOPRESSOR) 50 MG tablet Take 1 tablet (50 mg total) by mouth 2 (two) times daily. 06/25/17  Yes Sheikh, Omair Latif, DO  Multiple Vitamin (MULTIVITAMIN WITH MINERALS) TABS tablet Take 1 tablet by mouth daily. 07/10/17  Yes Love, Ivan Anchors, PA-C  polyethylene glycol (MIRALAX / GLYCOLAX) packet Take 17 g by mouth daily. 06/26/17  Yes Sheikh, Omair Latif, DO  QUEtiapine (SEROQUEL) 25 MG tablet Take 1 tablet (25 mg total) by mouth daily at 8 pm. 07/10/17  Yes Love, Ivan Anchors, PA-C    senna-docusate (SENOKOT-S) 8.6-50 MG tablet Take 1 tablet by mouth 2 (two) times daily. 07/18/17  Yes Florencia Reasons, MD    Physical Exam: Vitals:   07/30/17 2000 07/30/17 2100 07/30/17 2200 07/30/17 2300  BP: 130/84 112/64 134/89 117/72  Pulse:  (!) 44 (!) 50 94  Resp: 20  18   Temp:    98.1 F (36.7 C)  TempSrc:    Oral  SpO2:  97% 94% 94%  Weight:      Height:        General: Alert, Awake and Oriented to Person. Appear in mild distress, affect appropriate Eyes: PERRL, Conjunctiva normal ENT: Oral Mucosa clear moist. Neck: no JVD, no Abnormal Mass  Or lumps Cardiovascular: S1 and S2 Present, no Murmur, Peripheral Pulses Present Respiratory: normal respiratory effort, Bilateral Air entry equal and Decreased, no use of accessory muscle, Clear to Auscultation, no Crackles, no wheezes Abdomen: Bowel Sound present, Soft and no tenderness, no hernia Skin: no redness, left leg Rash, no induration Extremities: bilateral Pedal edema, no calf tenderness Neurologic: Grossly no focal neuro deficit. Bilaterally Equal motor strength  Labs on Admission:  CBC: Recent Labs  Lab 07/30/17 1545  WBC 9.7  NEUTROABS 7.9*  HGB 9.6*  HCT 31.4*  MCV 93.5  PLT 621   Basic Metabolic Panel: Recent Labs  Lab 07/30/17 1545  NA 140  K 4.1  CL 100*  CO2 30  GLUCOSE 132*  BUN 26*  CREATININE 1.14  CALCIUM 8.3*   GFR: Estimated Creatinine Clearance: 43.4 mL/min (by C-G formula based on SCr of 1.14 mg/dL). Liver Function Tests: Recent Labs  Lab 07/30/17 1545  AST 31  ALT 25  ALKPHOS 56  BILITOT 0.6  PROT 5.8*  ALBUMIN 1.8*   No results for input(s): LIPASE, AMYLASE in the last 168 hours. Recent Labs  Lab 07/30/17 1545  AMMONIA <9*   Coagulation Profile: No results for input(s): INR, PROTIME in the last 168 hours. Cardiac Enzymes: Recent Labs  Lab 07/30/17 1545 07/30/17 2059  TROPONINI 0.06* 0.06*   BNP (last 3 results) No results for input(s): PROBNP in the last 8760  hours. HbA1C: No results for input(s): HGBA1C in the last 72 hours. CBG: No results for input(s): GLUCAP in the last 168 hours. Lipid Profile: No results for input(s): CHOL, HDL, LDLCALC, TRIG, CHOLHDL, LDLDIRECT in the last 72 hours. Thyroid Function Tests: Recent Labs    07/30/17 1545 07/30/17 2059  TSH 7.424*  --   FREET4  --  0.95   Anemia Panel: Recent Labs    07/30/17 2059  VITAMINB12 175*   Urine analysis:    Component Value Date/Time   COLORURINE YELLOW 07/30/2017 1558   APPEARANCEUR CLOUDY (A) 07/30/2017 1558   LABSPEC 1.011 07/30/2017 1558   PHURINE 7.0 07/30/2017 1558   GLUCOSEU NEGATIVE 07/30/2017 1558   HGBUR LARGE (A) 07/30/2017 1558   BILIRUBINUR NEGATIVE 07/30/2017 1558   KETONESUR NEGATIVE 07/30/2017 1558   PROTEINUR NEGATIVE 07/30/2017 1558   UROBILINOGEN 1.0 08/15/2009 1743   NITRITE NEGATIVE 07/30/2017 1558   LEUKOCYTESUR LARGE (A) 07/30/2017 1558    Radiological Exams on Admission: Dg Elbow Complete Left (3+view)  Result Date: 07/30/2017 CLINICAL DATA:  Fall, elbow pain. EXAM: LEFT ELBOW - COMPLETE 3+ VIEW COMPARISON:  None. FINDINGS: Osseous alignment is normal. No fracture line or displaced fracture fragment identified. No significant degenerative change. No appreciable joint effusion and adjacent soft tissues are unremarkable. IMPRESSION: Negative. Electronically Signed   By: Franki Cabot M.D.   On: 07/30/2017 16:43   Dg Wrist Complete Left  Result Date: 07/30/2017 CLINICAL DATA:  Fall, elbow and wrist pain.  Bruising to left wrist. EXAM: LEFT WRIST - COMPLETE 3+ VIEW COMPARISON:  None. FINDINGS: Osseous alignment is normal. No fracture line or displaced fracture fragment identified. Soft tissues about the left wrist are unremarkable. IMPRESSION: Negative. Electronically Signed   By: Franki Cabot M.D.   On: 07/30/2017 16:42   Ct Head Wo Contrast  Result Date: 07/30/2017 CLINICAL DATA:  84 year old male with steady decline. Initial encounter.  EXAM: CT HEAD WITHOUT CONTRAST TECHNIQUE: Contiguous axial images were obtained from the base of the skull through the vertex without intravenous contrast. COMPARISON:  06/20/2017.  FINDINGS: Brain: No intracranial hemorrhage or CT evidence of large acute infarct. Prominent chronic microvascular changes. Moderate global atrophy. No intracranial mass lesion noted on this unenhanced exam. Vascular: Vascular calcifications Skull: No skull fracture Sinuses/Orbits: No acute orbital abnormality. Minimal mucosal thickening inferior maxillary sinuses. Other: Mastoid air cells and middle ear cavities are clear. IMPRESSION: No acute intracranial abnormality. Specifically, no evidence of intracranial hemorrhage or CT evidence of large acute infarct. Prominent chronic microvascular changes. Atrophy. Electronically Signed   By: Genia Del M.D.   On: 07/30/2017 17:53   Ct Angio Chest Pe W And/or Wo Contrast  Result Date: 07/30/2017 CLINICAL DATA:  Shortness of breath. EXAM: CT ANGIOGRAPHY CHEST WITH CONTRAST TECHNIQUE: Multidetector CT imaging of the chest was performed using the standard protocol during bolus administration of intravenous contrast. Multiplanar CT image reconstructions and MIPs were obtained to evaluate the vascular anatomy. CONTRAST:  128mL ISOVUE-370 IOPAMIDOL (ISOVUE-370) INJECTION 76% COMPARISON:  Radiograph of same day. FINDINGS: Cardiovascular: Satisfactory opacification of the pulmonary arteries to the segmental level. No evidence of pulmonary embolism. Normal heart size. No pericardial effusion. Coronary artery calcifications are noted. Mediastinum/Nodes: Thyroid gland and esophagus are unremarkable. 14 mm right hilar lymph node is noted. Lungs/Pleura: 29 x 14 mm lobulated irregular density is noted in right lower lobe concerning for malignancy. No pneumothorax or pleural effusion is noted. Mild emphysematous disease is noted in the upper lobes. Probable scarring is noted in superior segment of  right lower lobe medially. Mild bibasilar subsegmental atelectasis is noted. Upper Abdomen: Probable hepatic cysts are noted. Musculoskeletal: No chest wall abnormality. No acute or significant osseous findings. Review of the MIP images confirms the above findings. IMPRESSION: No definite evidence of pulmonary embolus. 29 x 14 mm lobulated irregular density is noted in right lower lobe consistent with malignancy. 14 mm right hilar lymph node is also noted concerning for metastatic disease. Coronary artery calcifications are noted suggesting coronary artery disease. Aortic Atherosclerosis (ICD10-I70.0) and Emphysema (ICD10-J43.9). Electronically Signed   By: Marijo Conception, M.D.   On: 07/30/2017 18:07   Dg Chest Port 1 View  Result Date: 07/30/2017 CLINICAL DATA:  Cough. EXAM: PORTABLE CHEST 1 VIEW COMPARISON:  06/20/2017 and back to 05/21/2014. FINDINGS: Numerous leads and wires project over the chest. Midline trachea. Moderate cardiomegaly. Atherosclerosis in the transverse aorta. No pleural effusion or pneumothorax. Hyperinflation. Right mid lung spiculated density measures on the order of 3.0 x 2.1 cm. IMPRESSION: Right mid lung spiculated density is suspicious for primary bronchogenic carcinoma. Further evaluation with chest CT (ideally with contrast) recommended. Cardiomegaly.  Aortic Atherosclerosis (ICD10-I70.0). These results were called by telephone at the time of interpretation on 07/30/2017 at 3:46 pm to [facility], r.n., who verbally acknowledged these results. Electronically Signed   By: Abigail Miyamoto M.D.   On: 07/30/2017 15:49   Dg Shoulder Left  Result Date: 07/30/2017 CLINICAL DATA:  Fall.  Bruising. EXAM: LEFT SHOULDER - 2+ VIEW COMPARISON:  06/20/2017 chest radiograph. FINDINGS: AP and scapular views. Visualized portion of the left hemithorax is normal. No acute fracture or dislocation. IMPRESSION: No acute osseous abnormality. Electronically Signed   By: Abigail Miyamoto M.D.   On: 07/30/2017  16:42   EKG: Independently reviewed. normal sinus rhythm, nonspecific ST and T waves changes.  Assessment/Plan 1. Lung cancer [facility]) Patient presents with complaints of fatigue and leg swelling. Appears to have significant failure to thrive. Recurrent admission in the hospital with multiple other comorbidities. CT scan of the chest was performed which shows  a 3 cm x 4.5 cm lobulated irregular mass in the right lobe along with a 14 mm hilar lymph node consistent with a possible malignancy as well as metastatic disease. Discussed with patient's family son and wife at bedside. I felt that the patient is not a great candidate for any chemotherapy or radiation going in the future given his multiple comorbidities as well as severe dementia and they agreed with it. Recommended that no further workup should be performed and they agree with that as well. Palliative care consulted for further goals of care clarification. Patient is already DNR/DNI.  2.  Failure to thrive, leg edema. Hypoalbuminemia with third spacing. Patient appears to be having hypoalbuminemia causing third spacing. Patient was given IV Lasix with mild elevation in BUN right now. Currently will be using that stocking and monitor. Holding aggressive diuresis for now.  3.  Acute encephalopathy. Metabolic likely in the setting of UTI.  Chronic indwelling Foley catheter Patient will be started on IV ceftriaxone. Has chronic Foley catheter. Follow-up on the culture. Patient's catheter is difficult to change and need urology consultation. If the urine culture comes back positive and patient shows improvement in his mentation after a few days of IV antibiotics would consider changing the Foley catheter.  4.  Malnutrition. Moderate. Hypoalbuminemia. Dietary consult. Continue Megace.  5. chronic A. fib. Still in A. fib. Continue digoxin, Cardizem, Lopressor.  Also continue amiodarone  6.  Hypothyroidism. TSH is elevated,  check free T4. If if free T4 is low patient will require Synthroid supplementation as well as reconsideration of being on amiodarone.  7.  History of CAD. Elevated troponin. Patient's troponin remains elevated 0.60.6. Not consistent with ACS pattern. Denies having any chest pain.  Therefore I do not think this is ACS and more likely demand ischemia. Echocardiogram ordered. Monitor.  Nutrition: regular diet DVT Prophylaxis: subcutaneous Heparin  Advance goals of care discussion: DNR DNI   Consults: Palliative care   Family Communication: family was present at bedside, at the time of interview.  Opportunity was given to ask question and all questions were answered satisfactorily.  Disposition: Admitted as observation telemetry unit. Likely to be discharged SNF, in 1-2 days.  Author: Berle Mull, MD [facility] Pager: [phone] 07/30/2017  If 7PM-7AM, please contact night-coverage [email or web address] Password [email or web address]

## 2017-07-30 NOTE — ED Notes (Signed)
[facility]p aware of troponin

## 2017-07-30 NOTE — ED Notes (Signed)
Patient transported to X-ray 

## 2017-07-30 NOTE — ED Notes (Signed)
Patient transported to CT 

## 2017-07-30 NOTE — Progress Notes (Signed)
Pharmacy Antibiotic Note  Jeremy Macdonald is a 84 y.o. male admitted on 07/30/2017 with UTI.  Pharmacy has been consulted for ceftriaxone dosing.  UA showing numerous WBC, numerous RBC, rare bacteria, negative nitrite, and large leukocyte. WBC WNL. LA 0.65. Scr 1.14 (CrCl 43 mL/min).   Plan: Ceftriaxone 1 gram every 24 hours  Monitor cx results, clinical pictures, and LOT  Height: 5\' 9"  (175.3 cm) Weight: 154 lb (69.9 kg) IBW/kg (Calculated) : 70.7  Temp (24hrs), Avg:98.1 F (36.7 C), Min:98.1 F (36.7 C), Max:98.1 F (36.7 C)  Recent Labs  Lab 07/30/17 1545 07/30/17 1612 07/30/17 1904  WBC 9.7  --   --   CREATININE 1.14  --   --   LATICACIDVEN  --  1.23 0.65    Estimated Creatinine Clearance: 43.4 mL/min (by C-G formula based on SCr of 1.14 mg/dL).    Allergies  Allergen Reactions  . Phenergan [Promethazine Hcl] Other (See Comments)    Confusion     Antimicrobials this admission: Ceftiaxone 2/25 >>   Dose adjustments this admission: N/A  Microbiology results: 2/25 UCx: sent   Thank you for allowing pharmacy to be a part of this patient's care.  Doylene Canard, PharmD Clinical Pharmacist  Pager: [phone] Clinical Phone for 07/30/2017 until 3:30pm: [phone] If after 3:30pm, please call main pharmacy at [phone] 07/30/2017 8:50 PM

## 2017-07-30 NOTE — ED Triage Notes (Signed)
Pt arrived via [facility] from [facility] after wife called 98 today stating she feels he is having a steady decline. Legs are wrapped with coban, facility reports has increased lasix dosage for the last couple days.  Left hand, BUE and BLE edema.

## 2017-07-30 NOTE — ED Notes (Signed)
edp aware of troponin

## 2017-07-30 NOTE — ED Notes (Signed)
Pt family left, pt bed alarm was set on bed for safety and call light is within reach.

## 2017-07-31 ENCOUNTER — Other Ambulatory Visit (HOSPITAL_COMMUNITY): Payer: Medicare Other

## 2017-07-31 DIAGNOSIS — Z9181 History of falling: Secondary | ICD-10-CM | POA: Diagnosis not present

## 2017-07-31 DIAGNOSIS — T83511S Infection and inflammatory reaction due to indwelling urethral catheter, sequela: Secondary | ICD-10-CM | POA: Diagnosis not present

## 2017-07-31 DIAGNOSIS — R748 Abnormal levels of other serum enzymes: Secondary | ICD-10-CM | POA: Diagnosis not present

## 2017-07-31 DIAGNOSIS — E8809 Other disorders of plasma-protein metabolism, not elsewhere classified: Secondary | ICD-10-CM | POA: Diagnosis not present

## 2017-07-31 DIAGNOSIS — I10 Essential (primary) hypertension: Secondary | ICD-10-CM | POA: Diagnosis present

## 2017-07-31 DIAGNOSIS — R634 Abnormal weight loss: Secondary | ICD-10-CM | POA: Diagnosis not present

## 2017-07-31 DIAGNOSIS — R627 Adult failure to thrive: Secondary | ICD-10-CM | POA: Diagnosis present

## 2017-07-31 DIAGNOSIS — F0280 Dementia in other diseases classified elsewhere without behavioral disturbance: Secondary | ICD-10-CM | POA: Diagnosis not present

## 2017-07-31 DIAGNOSIS — T83511D Infection and inflammatory reaction due to indwelling urethral catheter, subsequent encounter: Secondary | ICD-10-CM | POA: Diagnosis not present

## 2017-07-31 DIAGNOSIS — Z955 Presence of coronary angioplasty implant and graft: Secondary | ICD-10-CM | POA: Diagnosis not present

## 2017-07-31 DIAGNOSIS — R338 Other retention of urine: Secondary | ICD-10-CM | POA: Diagnosis not present

## 2017-07-31 DIAGNOSIS — F0150 Vascular dementia without behavioral disturbance: Secondary | ICD-10-CM | POA: Diagnosis not present

## 2017-07-31 DIAGNOSIS — J984 Other disorders of lung: Secondary | ICD-10-CM | POA: Diagnosis not present

## 2017-07-31 DIAGNOSIS — R1310 Dysphagia, unspecified: Secondary | ICD-10-CM | POA: Diagnosis not present

## 2017-07-31 DIAGNOSIS — Z87891 Personal history of nicotine dependence: Secondary | ICD-10-CM | POA: Diagnosis not present

## 2017-07-31 DIAGNOSIS — Z436 Encounter for attention to other artificial openings of urinary tract: Secondary | ICD-10-CM | POA: Diagnosis not present

## 2017-07-31 DIAGNOSIS — N390 Urinary tract infection, site not specified: Secondary | ICD-10-CM | POA: Diagnosis not present

## 2017-07-31 DIAGNOSIS — Y846 Urinary catheterization as the cause of abnormal reaction of the patient, or of later complication, without mention of misadventure at the time of the procedure: Secondary | ICD-10-CM | POA: Diagnosis present

## 2017-07-31 DIAGNOSIS — M79602 Pain in left arm: Secondary | ICD-10-CM | POA: Diagnosis present

## 2017-07-31 DIAGNOSIS — F0390 Unspecified dementia without behavioral disturbance: Secondary | ICD-10-CM | POA: Diagnosis present

## 2017-07-31 DIAGNOSIS — R319 Hematuria, unspecified: Secondary | ICD-10-CM

## 2017-07-31 DIAGNOSIS — F17210 Nicotine dependence, cigarettes, uncomplicated: Secondary | ICD-10-CM | POA: Diagnosis present

## 2017-07-31 DIAGNOSIS — C3491 Malignant neoplasm of unspecified part of right bronchus or lung: Secondary | ICD-10-CM | POA: Diagnosis not present

## 2017-07-31 DIAGNOSIS — E039 Hypothyroidism, unspecified: Secondary | ICD-10-CM | POA: Diagnosis not present

## 2017-07-31 DIAGNOSIS — I4891 Unspecified atrial fibrillation: Secondary | ICD-10-CM | POA: Diagnosis not present

## 2017-07-31 DIAGNOSIS — Z7982 Long term (current) use of aspirin: Secondary | ICD-10-CM | POA: Diagnosis not present

## 2017-07-31 DIAGNOSIS — C801 Malignant (primary) neoplasm, unspecified: Secondary | ICD-10-CM | POA: Diagnosis not present

## 2017-07-31 DIAGNOSIS — J449 Chronic obstructive pulmonary disease, unspecified: Secondary | ICD-10-CM | POA: Diagnosis present

## 2017-07-31 DIAGNOSIS — Z85828 Personal history of other malignant neoplasm of skin: Secondary | ICD-10-CM | POA: Diagnosis not present

## 2017-07-31 DIAGNOSIS — L89159 Pressure ulcer of sacral region, unspecified stage: Secondary | ICD-10-CM | POA: Diagnosis not present

## 2017-07-31 DIAGNOSIS — E43 Unspecified severe protein-calorie malnutrition: Secondary | ICD-10-CM | POA: Diagnosis not present

## 2017-07-31 DIAGNOSIS — G9341 Metabolic encephalopathy: Secondary | ICD-10-CM | POA: Diagnosis present

## 2017-07-31 DIAGNOSIS — C3411 Malignant neoplasm of upper lobe, right bronchus or lung: Secondary | ICD-10-CM | POA: Diagnosis not present

## 2017-07-31 DIAGNOSIS — Z66 Do not resuscitate: Secondary | ICD-10-CM | POA: Diagnosis present

## 2017-07-31 DIAGNOSIS — Z6822 Body mass index (BMI) 22.0-22.9, adult: Secondary | ICD-10-CM | POA: Diagnosis not present

## 2017-07-31 DIAGNOSIS — N401 Enlarged prostate with lower urinary tract symptoms: Secondary | ICD-10-CM | POA: Diagnosis not present

## 2017-07-31 DIAGNOSIS — N400 Enlarged prostate without lower urinary tract symptoms: Secondary | ICD-10-CM | POA: Diagnosis present

## 2017-07-31 DIAGNOSIS — I2510 Atherosclerotic heart disease of native coronary artery without angina pectoris: Secondary | ICD-10-CM | POA: Diagnosis not present

## 2017-07-31 DIAGNOSIS — C3431 Malignant neoplasm of lower lobe, right bronchus or lung: Secondary | ICD-10-CM | POA: Diagnosis present

## 2017-07-31 DIAGNOSIS — Z515 Encounter for palliative care: Secondary | ICD-10-CM | POA: Diagnosis not present

## 2017-07-31 DIAGNOSIS — E440 Moderate protein-calorie malnutrition: Secondary | ICD-10-CM | POA: Diagnosis not present

## 2017-07-31 DIAGNOSIS — Z8701 Personal history of pneumonia (recurrent): Secondary | ICD-10-CM | POA: Diagnosis not present

## 2017-07-31 DIAGNOSIS — I482 Chronic atrial fibrillation: Secondary | ICD-10-CM | POA: Diagnosis present

## 2017-07-31 DIAGNOSIS — T83511A Infection and inflammatory reaction due to indwelling urethral catheter, initial encounter: Secondary | ICD-10-CM | POA: Diagnosis present

## 2017-07-31 DIAGNOSIS — I248 Other forms of acute ischemic heart disease: Secondary | ICD-10-CM | POA: Diagnosis present

## 2017-07-31 LAB — COMPREHENSIVE METABOLIC PANEL
ALT: 22 U/L (ref 17–63)
AST: 26 U/L (ref 15–41)
Albumin: 1.7 g/dL — ABNORMAL LOW (ref 3.5–5.0)
Alkaline Phosphatase: 54 U/L (ref 38–126)
Anion gap: 10 (ref 5–15)
BUN: 22 mg/dL — ABNORMAL HIGH (ref 6–20)
CHLORIDE: 101 mmol/L (ref 101–111)
CO2: 27 mmol/L (ref 22–32)
Calcium: 8.1 mg/dL — ABNORMAL LOW (ref 8.9–10.3)
Creatinine, Ser: 0.97 mg/dL (ref 0.61–1.24)
Glucose, Bld: 132 mg/dL — ABNORMAL HIGH (ref 65–99)
POTASSIUM: 4 mmol/L (ref 3.5–5.1)
Sodium: 138 mmol/L (ref 135–145)
Total Bilirubin: 0.6 mg/dL (ref 0.3–1.2)
Total Protein: 5.4 g/dL — ABNORMAL LOW (ref 6.5–8.1)

## 2017-07-31 LAB — CBC WITH DIFFERENTIAL/PLATELET
BASOS ABS: 0 10*3/uL (ref 0.0–0.1)
Basophils Relative: 0 %
EOS PCT: 1 %
Eosinophils Absolute: 0.1 10*3/uL (ref 0.0–0.7)
HCT: 32.8 % — ABNORMAL LOW (ref 39.0–52.0)
Hemoglobin: 10 g/dL — ABNORMAL LOW (ref 13.0–17.0)
LYMPHS PCT: 17 %
Lymphs Abs: 1.5 10*3/uL (ref 0.7–4.0)
MCH: 28.2 pg (ref 26.0–34.0)
MCHC: 30.5 g/dL (ref 30.0–36.0)
MCV: 92.4 fL (ref 78.0–100.0)
MONO ABS: 0.7 10*3/uL (ref 0.1–1.0)
Monocytes Relative: 8 %
Neutro Abs: 6.6 10*3/uL (ref 1.7–7.7)
Neutrophils Relative %: 74 %
PLATELETS: 153 10*3/uL (ref 150–400)
RBC: 3.55 MIL/uL — ABNORMAL LOW (ref 4.22–5.81)
RDW: 16.5 % — AB (ref 11.5–15.5)
WBC: 8.8 10*3/uL (ref 4.0–10.5)

## 2017-07-31 LAB — GLUCOSE, CAPILLARY: GLUCOSE-CAPILLARY: 129 mg/dL — AB (ref 65–99)

## 2017-07-31 LAB — TROPONIN I
TROPONIN I: 0.06 ng/mL — AB (ref <0.03)
Troponin I: 0.06 ng/mL (ref <0.03)

## 2017-07-31 MED ORDER — CYANOCOBALAMIN 1000 MCG/ML IJ SOLN
1000.0000 ug | Freq: Once | INTRAMUSCULAR | Status: AC
  Administered 2017-07-31: 1000 ug via SUBCUTANEOUS
  Filled 2017-07-31: qty 1

## 2017-07-31 NOTE — Evaluation (Signed)
Occupational Therapy Evaluation and Discharge Patient Details Name: Jeremy Macdonald MRN: [id number] DOB: 03-31-1926 Today's Date: 07/31/2017    History of Present Illness Jeremy Macdonald is a 84 y.o. male with Past medical history of A. fib, CAD S/P PCI 1118, COPD, dementia, recurrent fall (one a month ago and one recently in SNF), hematuria with chronic indwelling Foley catheter. Admitted with leg swelling and confusion. Found to have probable lung CA,, FTT, and Acute encephalopathy.   Clinical Impression   This 84 yo male admitted with above presents to acute OT with deficits below (see OT problem list) thus affecting his ability to A with basic ADLs. He will benefit from continued OT at SNF, defer remainder of OT to this facility, we will sign off.    Follow Up Recommendations  SNF;Supervision/Assistance - 24 hour    Equipment Recommendations  Other (comment)(TBD at next venue)       Precautions / Restrictions Precautions Precautions: Fall Precaution Comments: watch O2 Restrictions Weight Bearing Restrictions: No      Mobility Bed Mobility Overal bed mobility: Needs Assistance Bed Mobility: Supine to Sit     Supine to sit: Mod assist;+2 for physical assistance     General bed mobility comments: ModA to bring legs around and bring trunk up, patient then able to maintain midline sitting at EOB   Transfers Overall transfer level: Needs assistance Equipment used: 2 person hand held assist Transfers: Sit to/from Stand;Stand Pivot Transfers Sit to Stand: Max assist;+2 physical assistance Stand pivot transfers: Max assist;+2 physical assistance            Balance Overall balance assessment: Needs assistance Sitting-balance support: No upper extremity supported;Feet supported Sitting balance-Leahy Scale: Fair     Standing balance support: Bilateral upper extremity supported;During functional activity Standing balance-Leahy Scale: Poor Standing balance  comment: reliant on Bil UE support                            ADL either performed or assessed with clinical judgement   ADL Overall ADL's : Needs assistance/impaired Eating/Feeding: Bed level Eating/Feeding Details (indicate cue type and reason): Once food presented on utensil pt could feed self, once cup placed in pt's hand he would drink Grooming: Minimal assistance;Sitting Grooming Details (indicate cue type and reason): Mod cues Upper Body Bathing: Minimal assistance;Sitting Upper Body Bathing Details (indicate cue type and reason): Mod cues Lower Body Bathing: Maximal assistance Lower Body Bathing Details (indicate cue type and reason): Max A +2 sit<>stand Upper Body Dressing : Maximal assistance;Sitting Upper Body Dressing Details (indicate cue type and reason): Mod cues Lower Body Dressing: Total assistance Lower Body Dressing Details (indicate cue type and reason): Max A +2 sit<>stand Toilet Transfer: +2 for physical assistance;Maximal assistance;Stand-pivot Toilet Transfer Details (indicate cue type and reason): bed>recliner with constant cues Toileting- Clothing Manipulation and Hygiene: Total assistance Toileting - Clothing Manipulation Details (indicate cue type and reason): Mod A to maintain standing             Vision   Additional Comments: wear glasses per chart from 3 weeks ago and pt reports he wears reading glasses, pt with intermittent decreased  attention to tasks (needed to call his name to get him to refocus visually)            Pertinent Vitals/Pain Pain Assessment: Faces Faces Pain Scale: Hurts little more Pain Location: back Pain Descriptors / Indicators: Grimacing Pain Intervention(s): Limited activity within patient's tolerance;Repositioned  Hand Dominance  right   Extremity/Trunk Assessment Upper Extremity Assessment Upper Extremity Assessment: Generalized weakness              Cognition Arousal/Alertness: Awake/alert    Overall Cognitive Status: History of cognitive impairments - at baseline                                 General Comments: History of dementia; not oriented to place (thought he was at home and when told he was at [facility] he said "Oh no I'm not"), oriented to BD, increased time and tactile cues to follow commands              Home Living Family/patient expects to be discharged to:: Skilled nursing facility                                                 OT Problem List: Impaired balance (sitting and/or standing);Decreased strength;Decreased cognition;Decreased safety awareness;Pain;Decreased knowledge of use of DME or AE         OT Goals(Current goals can be found in the care plan section) Acute Rehab OT Goals Patient Stated Goal: agreeable to get up OT Goal Formulation: Patient unable to participate in goal setting Time For Goal Achievement: 08/14/17 Potential to Achieve Goals: Fair  OT Frequency:             Co-evaluation PT/OT/SLP Co-Evaluation/Treatment: Yes Reason for Co-Treatment: To address functional/ADL transfers   OT goals addressed during session: Strengthening/ROM      AM-PAC PT "6 Clicks" Daily Activity     Outcome Measure Help from another person eating meals?: A Lot Help from another person taking care of personal grooming?: A Lot Help from another person toileting, which includes using toliet, bedpan, or urinal?: Total Help from another person bathing (including washing, rinsing, drying)?: A Lot Help from another person to put on and taking off regular upper body clothing?: A Lot Help from another person to put on and taking off regular lower body clothing?: Total 6 Click Score: 10   End of Session Equipment Utilized During Treatment: Gait belt Nurse Communication: Mobility status(NT; also spoke to RN about swallow eval)  Activity Tolerance: Patient tolerated treatment well Patient left: in chair;with  call bell/phone within reach;with chair alarm set  OT Visit Diagnosis: Unsteadiness on feet (R26.81);Other abnormalities of gait and mobility (R26.89);Muscle weakness (generalized) (M62.81);Repeated falls (R29.6);History of falling (Z91.81);Pain Pain - part of body: (back)                Time: 1443-1540 OT Time Calculation (min): 33 min Charges:  OT General Charges $OT Visit: 1 Visit OT Evaluation $OT Eval Moderate Complexity: [address] [place] [id number] 07/31/2017

## 2017-07-31 NOTE — NC FL2 (Signed)
[place] LEVEL OF CARE SCREENING TOOL     IDENTIFICATION  Patient Name: Jeremy Macdonald Birthdate: 1925/08/03 Sex: male Admission Date (Current Location): 07/30/2017  [facility] and [place] Number:  Herbalist and Address:  [facility] [place], [place] [address] [place], [place] [address]      Provider Number: [id number]  Attending Physician Name and Address:  Geradine Girt, DO  Relative Name and Phone Number:  Jonmarc Bodkin, spouse, [phone]    Current Level of Care: Hospital Recommended Level of Care: [place] Prior Approval Number:    Date Approved/Denied:   PASRR Number: [id number]  Discharge Plan: SNF    Current Diagnoses: Patient Active Problem List   Diagnosis Date Noted  . Lung cancer ([place]) 07/30/2017  . UTI (urinary tract infection) due to urinary indwelling Foley catheter ([place]) 07/30/2017  . Hematuria 07/15/2017  . Chronic anticoagulation 07/14/2017  . HTN (hypertension) 07/14/2017  . Pain   . Debility 06/25/2017  . Chronic obstructive pulmonary disease ([place])   . Bilateral hearing loss   . Leukocytosis   . Gross hematuria   . Acute blood loss anemia   . Hypoalbuminemia   . Malnutrition of moderate degree 06/24/2017  . Dementia 06/21/2017  . Hyperglycemia 06/21/2017  . BPH (benign prostatic hyperplasia) 06/21/2017  . Dizziness 06/21/2017  . Atrial fibrillation with rapid ventricular response ([place]) 06/20/2017  . Atrial fibrillation ([place]) 06/20/2017  . Post PTCA 04/24/2017  . CAD (coronary artery disease), native coronary artery 04/22/2017  . Angina pectoris ([place]) 04/22/2017  . PAF (paroxysmal atrial fibrillation) ([place]) 03/18/2017  . Abnormal nuclear stress test 03/18/2017    Orientation RESPIRATION BLADDER Height & Weight     Self  O2(Nasal cannula 2L) Incontinent, Indwelling catheter Weight: 69.9 kg (154 lb) Height:  5\' 9"  (175.3 cm)  BEHAVIORAL SYMPTOMS/MOOD NEUROLOGICAL BOWEL  NUTRITION STATUS      Continent Diet(Please see DC Summary)  AMBULATORY STATUS COMMUNICATION OF NEEDS Skin   Limited Assist Verbally PU Stage and Appropriate Care                       Personal Care Assistance Level of Assistance  Bathing, Dressing, Feeding Bathing Assistance: Limited assistance Feeding assistance: Limited assistance Dressing Assistance: Limited assistance     Functional Limitations Info  Sight, Hearing, Speech Sight Info: Impaired Hearing Info: Impaired Speech Info: Adequate    SPECIAL CARE FACTORS FREQUENCY  PT (By licensed PT)     PT Frequency: 5x/week              Contractures Contractures Info: Not present    Additional Factors Info  Code Status, Allergies, Psychotropic Code Status Info: DNR Allergies Info: Phenergan Psychotropic Info: Seroquel         Current Medications (07/31/2017):  This is the current hospital active medication list Current Facility-Administered Medications  Medication Dose Route Frequency Provider Last Rate Last Dose  . acetaminophen (TYLENOL) tablet 650 mg  650 mg Oral Q6H PRN Lavina Hamman, MD       Or  . acetaminophen (TYLENOL) suppository 650 mg  650 mg Rectal Q6H PRN Lavina Hamman, MD      . amiodarone (PACERONE) tablet 200 mg  200 mg Oral Daily Lavina Hamman, MD      . aspirin EC tablet 81 mg  81 mg Oral Daily Lavina Hamman, MD      . atorvastatin (LIPITOR) tablet 10 mg  10 mg Oral q1800 Lavina Hamman, MD   10 mg at 07/30/17 2131  . cefTRIAXone (ROCEPHIN) 1 g in sodium chloride 0.9 % 100 mL IVPB  1 g Intravenous Q24H Lavina Hamman, MD   Stopped at 07/30/17 2201  . clonazePAM (KLONOPIN) disintegrating tablet 0.25 mg  0.25 mg Oral TID PRN Lavina Hamman, MD      . cyanocobalamin ((VITAMIN B-12)) injection 1,000 mcg  1,000 mcg Subcutaneous Once Lavina Hamman, MD      . digoxin Fonnie [facility]) tablet 0.125 mg  0.125 mg Oral Daily Lavina Hamman, MD      . diltiazem (CARDIZEM) tablet 60 mg  60 mg Oral  Q8H Lavina Hamman, MD   60 mg at 07/31/17 6294  . dutasteride (AVODART) capsule 0.5 mg  0.5 mg Oral Daily Lavina Hamman, MD       And  . tamsulosin [facility]) capsule 0.4 mg  0.4 mg Oral Daily Lavina Hamman, MD      . enoxaparin (LOVENOX) injection 40 mg  40 mg Subcutaneous Q24H Lavina Hamman, MD   40 mg at 07/30/17 2132  . feeding supplement (ENSURE ENLIVE) (ENSURE ENLIVE) liquid 237 mL  237 mL Oral TID WC & HS Lavina Hamman, MD   237 mL at 07/30/17 2128  . folic acid (FOLVITE) tablet 1 mg  1 mg Oral Daily Lavina Hamman, MD   1 mg at 07/30/17 2131  . furosemide (LASIX) tablet 40 mg  40 mg Oral BID Lavina Hamman, MD      . megestrol (MEGACE) 400 MG/10ML suspension 400 mg  400 mg Oral BID Lavina Hamman, MD      . metoprolol tartrate (LOPRESSOR) tablet 50 mg  50 mg Oral BID Lavina Hamman, MD   50 mg at 07/30/17 2132  . multivitamin with minerals tablet 1 tablet  1 tablet Oral Daily Lavina Hamman, MD   1 tablet at 07/30/17 2131  . ondansetron (ZOFRAN) tablet 4 mg  4 mg Oral Q6H PRN Lavina Hamman, MD       Or  . ondansetron [facility]) injection 4 mg  4 mg Intravenous Q6H PRN Lavina Hamman, MD      . polyethylene glycol (MIRALAX / GLYCOLAX) packet 17 g  17 g Oral Daily Lavina Hamman, MD   17 g at 07/30/17 2131  . QUEtiapine (SEROQUEL) tablet 25 mg  25 mg Oral Q2000 Lavina Hamman, MD   25 mg at 07/30/17 2135  . senna-docusate (Senokot-S) tablet 1 tablet  1 tablet Oral BID Lavina Hamman, MD   1 tablet at 07/30/17 2131  . sodium chloride flush (NS) 0.9 % injection 3 mL  3 mL Intravenous Q12H Lavina Hamman, MD   3 mL at 07/30/17 2130     Discharge Medications: Please see discharge summary for a list of discharge medications.  Relevant Imaging Results:  Relevant Lab Results:   Additional Information SSN: [id number]  Benard Halsted, LCSWA

## 2017-07-31 NOTE — Progress Notes (Signed)
Initial Nutrition Assessment  DOCUMENTATION CODES:   Severe malnutrition in context of chronic illness  INTERVENTION:  Continue Ensure Enlive po QID, each supplement provides 350 kcal and 20 grams of protein  Continue Megace  NUTRITION DIAGNOSIS:   Moderate Malnutrition related to chronic illness as evidenced by percent weight loss  GOAL:   Patient will meet greater than or equal to 90% of their needs  MONITOR:   PO intake, Skin, Supplement acceptance, I & O's, Weight trends  REASON FOR ASSESSMENT:   Consult Assessment of nutrition requirement/status  ASSESSMENT:   Jeremy Macdonald is an 84 yo male with PMH of A-Fib, CAD, S/P PCI, COPD, Dementia, Recurrent Falls, Lung Cancer, Hematuria with chronic foley cath. Presents with leg swelling, confusion, delusions, failure to thrive 1 week ago he fell at nursing home but no work-up was done. Palliative consulted to clarify [place].  Spoke with Jeremy Macdonald and wife at bedside. She reports he has not eaten much over the past 6 weeks, only eating bites. Maybe 1 meal a day total from all the bites he eats. Had a few pieces of [place] bacon this morning, but did not eat anything else. Down from 170 pounds to 154 pounds during this 6 week time span, a 9.4% severe weight loss. Patient does like ensure, was drinking QID upon previous admission.   Meal Completion: 40%  Labs reviewed Medications reviewed and include:  Folic Acid, MVI w/ Minerals, Senokot-S, Miralax  Diet Order:  Diet regular Room service appropriate? Yes; Fluid consistency: Thin  EDUCATION NEEDS:   Not appropriate for education at this time  Skin:  Skin Assessment: Skin Integrity Issues: Skin Integrity Issues:: Stage II Stage II: coccyx  Last BM:  07/17/2017  Height:   Ht Readings from Last 1 Encounters:  07/30/17 5\' 9"  (1.753 m)    Weight:   Wt Readings from Last 1 Encounters:  07/30/17 154 lb (69.9 kg)    Ideal Body Weight:  72.7 kg  BMI:  Body mass index  is 22.74 kg/m.  Estimated Nutritional Needs:   Kcal:  1700-1900 calories  Protein:  105-119 grams  Fluid:  Per MD  Satira Anis. Zyria Fiscus, MS, RD LDN Inpatient Clinical Dietitian Pager [phone]

## 2017-07-31 NOTE — Evaluation (Signed)
Physical Therapy Evaluation Patient Details Name: Jeremy Macdonald MRN: [id number] DOB: 11-18-1925 Today's Date: 07/31/2017   History of Present Illness  Jeremy Macdonald is a 84 y.o. male with Past medical history of A. fib, CAD S/P PCI 1118, COPD, dementia, recurrent fall (one a month ago and one recently in SNF), hematuria with chronic indwelling Foley catheter. Admitted with leg swelling and confusion. Found to have probable lung CA,, FTT, and Acute encephalopathy.  Clinical Impression  Pt admitted with above diagnosis. Pt currently with functional limitations due to the deficits listed below (see PT Problem List). Pt very weak on eval, required mod A +2 for bed mobility and max A +2 for sit to stand and transfer to chair. Pt was not safe and did not have sufficient strength to ambulate today.   Pt will benefit from skilled PT to increase their independence and safety with mobility to allow discharge to the venue listed below.       Follow Up Recommendations SNF    Equipment Recommendations  None recommended by PT    Recommendations for Other Services       Precautions / Restrictions Precautions Precautions: Fall Precaution Comments: watch O2 Restrictions Weight Bearing Restrictions: No      Mobility  Bed Mobility Overal bed mobility: Needs Assistance Bed Mobility: Supine to Sit     Supine to sit: Mod assist;+2 for physical assistance     General bed mobility comments: ModA to bring legs around and bring trunk up, patient then able to maintain midline sitting at EOB   Transfers Overall transfer level: Needs assistance Equipment used: 2 person hand held assist Transfers: Sit to/from Stand;Stand Pivot Transfers Sit to Stand: Max assist;+2 physical assistance Stand pivot transfers: Max assist;+2 physical assistance       General transfer comment: mod A +2 for power up and pt had difficulty stepping feet to chair, Could not take bkwd steps at all, chair had to  be brought up to him. Max A +2 for support  Ambulation/Gait             General Gait Details: unable due to decreased safety and fatigue with transfer  Stairs            Wheelchair Mobility    Modified Rankin (Stroke Patients Only)       Balance Overall balance assessment: Needs assistance Sitting-balance support: Single extremity supported;Feet supported Sitting balance-Leahy Scale: Fair     Standing balance support: Bilateral upper extremity supported;During functional activity Standing balance-Leahy Scale: Poor Standing balance comment: heavily reliant on Bil UE support                              Pertinent Vitals/Pain Pain Assessment: Faces Faces Pain Scale: Hurts little more Pain Location: back Pain Descriptors / Indicators: Grimacing Pain Intervention(s): Monitored during session;Repositioned    Home Living Family/patient expects to be discharged to:: Skilled nursing facility Living Arrangements: Spouse/significant other Available Help at Discharge: Family Type of Home: Apartment Home Access: Level entry     Home Layout: One level Home Equipment: Walker - standard;Cane - single point      Prior Function Level of Independence: Needs assistance   Gait / Transfers Assistance Needed: pt has been falling recently, fell at SNF before coming in  ADL's / Homemaking Assistance Needed: asisst needed at SNF        Hand Dominance   Dominant Hand: Right  Extremity/Trunk Assessment   Upper Extremity Assessment Upper Extremity Assessment: Defer to OT evaluation    Lower Extremity Assessment Lower Extremity Assessment: Generalized weakness    Cervical / Trunk Assessment Cervical / Trunk Assessment: Kyphotic  Communication   Communication: HOH  Cognition Arousal/Alertness: Awake/alert Behavior During Therapy: WFL for tasks assessed/performed Overall Cognitive Status: History of cognitive impairments - at baseline                                  General Comments: History of dementia; not oriented to place (thought he was at home and when told he was at [facility] he said "Oh no I'm not"), oriented to BD, increased time and tactile cues to follow commands      General Comments General comments (skin integrity, edema, etc.): Pt had difficulty focusing vision today, he did report that he wears glasses but he would not see items such as a cup or remote that were right in front of them and then when he was cued to look at them specifically he would focus and could see them. O2 sats dropped to 89% on RA, 2L O2 replaced and sats into 90's    Exercises     Assessment/Plan    PT Assessment Patient needs continued PT services  PT Problem List Decreased cognition;Decreased knowledge of use of DME;Decreased knowledge of precautions;Decreased mobility;Decreased balance;Decreased activity tolerance;Decreased strength;Pain;Decreased safety awareness;Decreased coordination       PT Treatment Interventions DME instruction;Gait training;Functional mobility training;Therapeutic activities;Therapeutic exercise;Balance training;Neuromuscular re-education;Cognitive remediation;Patient/family education    PT Goals (Current goals can be found in the Care Plan section)  Acute Rehab PT Goals Patient Stated Goal: agreeable to get up PT Goal Formulation: With patient Time For Goal Achievement: 08/14/17 Potential to Achieve Goals: Fair    Frequency Min 2X/week   Barriers to discharge        Co-evaluation PT/OT/SLP Co-Evaluation/Treatment: Yes Reason for Co-Treatment: Complexity of the patient's impairments (multi-system involvement);For patient/therapist safety;Necessary to address cognition/behavior during functional activity PT goals addressed during session: Mobility/safety with mobility;Balance OT goals addressed during session: Strengthening/ROM       AM-PAC PT "6 Clicks" Daily Activity  Outcome  Measure Difficulty turning over in bed (including adjusting bedclothes, sheets and blankets)?: Unable Difficulty moving from lying on back to sitting on the side of the bed? : Unable Difficulty sitting down on and standing up from a chair with arms (e.g., wheelchair, bedside commode, etc,.)?: Unable Help needed moving to and from a bed to chair (including a wheelchair)?: A Lot Help needed walking in hospital room?: Total Help needed climbing 3-5 steps with a railing? : Total 6 Click Score: 7    End of Session Equipment Utilized During Treatment: Gait belt Activity Tolerance: Patient limited by fatigue Patient left: in chair;with call bell/phone within reach;with chair alarm set Nurse Communication: Mobility status PT Visit Diagnosis: Unsteadiness on feet (R26.81);Muscle weakness (generalized) (M62.81);Difficulty in walking, not elsewhere classified (R26.2);History of falling (Z91.81)    Time: 2633-3545 PT Time Calculation (min) (ACUTE ONLY): 18 min   Charges:   PT Evaluation $PT Eval Moderate Complexity: 1 Mod     PT G Codes:        Leighton Roach, PT  Acute Rehab Services  [place] 07/31/2017, 11:11 AM

## 2017-07-31 NOTE — Evaluation (Addendum)
Clinical/Bedside Swallow Evaluation Patient Details  Name: Jeremy Macdonald MRN: [id number] Date of Birth: Jul 01, 1925  Today's Date: 07/31/2017 Time: SLP Start Time (ACUTE ONLY): 1043 SLP Stop Time (ACUTE ONLY): 1056 SLP Time Calculation (min) (ACUTE ONLY): 13 min  Past Medical History:  Past Medical History:  Diagnosis Date  . Abdominal aortic aneurysm (AAA) ([place])   . Arthritis    "shoulders" (04/24/2017)  . Atrial fibrillation with RVR ([place]) 03/2017   [name] 03/08/2017  . COPD (chronic obstructive pulmonary disease) ([place])   . Coronary artery disease   . Dementia   . Fall at home 06/17/2017   nasal fracture  . High cholesterol   . HOH (hard of hearing)   . Hypertension   . Pneumonia ~ 2003-2008 X 5   "5 straight years S/P pneumonia shot in ~ 2003"(04/24/2017)  . Skin cancer 2018   "cut it out of right arm"   Past Surgical History:  Past Surgical History:  Procedure Laterality Date  . APPENDECTOMY    . CARDIAC CATHETERIZATION    . CATARACT EXTRACTION, BILATERAL Bilateral   . CORONARY ANGIOPLASTY WITH STENT PLACEMENT    . CORONARY ATHERECTOMY N/A 04/24/2017   Procedure: CORONARY ATHERECTOMY;  Surgeon: Adrian Prows, MD;  Location: [facility];  Service: Cardiovascular;  Laterality: N/A;  . CORONARY STENT INTERVENTION N/A 04/24/2017   Procedure: CORONARY STENT INTERVENTION;  Surgeon: Adrian Prows, MD;  Location: [facility];  Service: Cardiovascular;  Laterality: N/A;  . INCISION AND DRAINAGE  1950s   "RLE; got hurt in service; had to get a bunch of stuff out"  . LEFT HEART CATH AND CORONARY ANGIOGRAPHY N/A 03/19/2017   Procedure: LEFT HEART CATH AND CORONARY ANGIOGRAPHY;  Surgeon: Adrian Prows, MD;  Location: [facility];  Service: Cardiovascular;  Laterality: N/A;  . SKIN CANCER EXCISION Right 2018   "arms"  . TOOTH EXTRACTION     HPI:  Jeremy Macdonald is a 84 y.o. male with Past medical history of A. fib, CAD S/P PCI 1118, COPD, dementia,  recurrent fall (one a month ago and one recently in SNF), hematuria with chronic indwelling Foley catheter. Admitted with leg swelling and confusion. Found to have probable lung CA,, FTT, and Acute encephalopathy. CT Angio Chest revealing No definite evidence of pulmonary embolus and lobulated irregular density is noted in RLL consistent with malignancy and right hilar lymph node is also noted concerning for metastatic disease.   Assessment / Plan / Recommendation Clinical Impression    Pt presents with signs concerning for aspiration (delayed/immediate throat clear and cough) with all consistencies trialed. Pt required min verbal cues to maintain upright posture for oral intake and to ensure oral clearance during assessment. Pt's wife was present for assessment and was educated on overt signs of aspiration and potential treatment recommendations (objective evaluation vs modified diet vs aspiration precautions). Wife does not want to pursue further testing and reports not wanting to modify pt's diet at this time. She wants to continue with current unrestricted diet to encourage oral intake, acknowledging the risk of aspiration. Therefore, recommend continuing regular solids and thin liquid diet with aspiration precautions (small/slow bites and sips, eating only when upright/alert). SLP will followfor additional education and interventions in accordance to pt/family's GOC.    SLP Visit Diagnosis: Dysphagia, unspecified (R13.10)    Aspiration Risk  Moderate aspiration risk    Diet Recommendation Regular;Thin liquid   Liquid Administration via: Cup;Straw Medication Administration: Crushed with puree Supervision: Full supervision/cueing  for compensatory strategies;Staff to assist with self feeding Compensations: Slow rate;Small sips/bites;Minimize environmental distractions Postural Changes: Seated upright at 90 degrees;Remain upright for at least 30 minutes after po intake    Other  Recommendations  Oral Care Recommendations: Oral care BID   Follow up Recommendations Other (comment)(tba)      Frequency and Duration min 2x/week  1 week       Prognosis Prognosis for Safe Diet Advancement: Guarded Barriers to Reach Goals: Cognitive deficits;Severity of deficits      Swallow Study   General HPI: Jeremy Macdonald is a 84 y.o. male with Past medical history of A. fib, CAD S/P PCI 1118, COPD, dementia, recurrent fall (one a month ago and one recently in SNF), hematuria with chronic indwelling Foley catheter. Admitted with leg swelling and confusion. Found to have probable lung CA,, FTT, and Acute encephalopathy. CT Angio Chest revealing No definite evidence of pulmonary embolus and lobulated irregular density is noted in RLL consistent with malignancy and right hilar lymph node is also noted concerning for metastatic disease. Type of Study: Bedside Swallow Evaluation Previous Swallow Assessment: none in chart Diet Prior to this Study: Regular;Thin liquids Temperature Spikes Noted: No Respiratory Status: Nasal cannula History of Recent Intubation: No Behavior/Cognition: Alert;Requires cueing Oral Cavity Assessment: Within Functional Limits Oral Care Completed by SLP: No Oral Cavity - Dentition: Adequate natural dentition Self-Feeding Abilities: Needs assist Patient Positioning: Upright in chair Baseline Vocal Quality: Normal    Oral/Motor/Sensory Function Overall Oral Motor/Sensory Function: Within functional limits   Ice Chips Ice chips: Not tested   Thin Liquid Thin Liquid: Impaired Presentation: Straw Pharyngeal  Phase Impairments: Throat Clearing - Delayed;Throat Clearing - Immediate;Cough - Immediate;Cough - Delayed    Nectar Thick Nectar Thick Liquid: Not tested   Honey Thick Honey Thick Liquid: Not tested   Puree Puree: Impaired Presentation: Spoon Pharyngeal Phase Impairments: Throat Clearing - Delayed;Throat Clearing - Immediate;Cough - Delayed   Solid   GO    Solid: Impaired Presentation: Self Fed Pharyngeal Phase Impairments: Throat Clearing - Delayed;Throat Clearing - Immediate;Cough - Delayed        [place] Emil Weigold 07/31/2017,1:57 PM   [place] Rosaland Shiffman SLP Student Clinician

## 2017-07-31 NOTE — Consult Note (Signed)
Consultation Note Date: 07/31/2017   Patient Name: Jeremy Macdonald  DOB: [birth year]  MRN: [id number]  Age / Sex: 84 y.o., male  PCP: Vernie Shanks, MD Referring Physician: Geradine Girt, DO  Reason for Consultation: Establishing goals of care, Non pain symptom management, Pain control and Psychosocial/spiritual support  HPI/Patient Profile: 84 y.o. male  admitted on 07/30/2017 with increased confusion, failure to thrive, and leg swelling. He has a past medical history of A-Fib, CAD S/P PCI 11/18, COPD, dementia, recurrent fall (one a month ago and one recently in SNF), hematuria with chronic indwelling Foley catheter. The family reported that the patient was having delusions and was trying to pull things from the air prior to admission. They denied observing any auditory or visual hallucinations. The family also reported that over a week ago he had a fall at the nursing home with no work-up, however he continues to complain of some left arm pain. Petra Kuba of the fall is unclear. Patient has hematuria with a chronic indwelling Foley catheter (via urology). At his baseline ambulates with support and is dependent for most of his ADLs; as well as medication management.   Clinical Assessment and Goals of Care: I have reviewed medical records including MAR, labs, and radiology results,  assessed the patient and then spoke over the phone with his wife ([name]), to discuss diagnosis prognosis, GOC, EOL wishes, disposition and options. There was no family at the bedside.   I introduced Palliative Medicine as specialized medical care for people living with serious illness. It focuses on providing relief from the symptoms and stress of a serious illness. The goal is to improve quality of life for both the patient and the family.  Mr. Ogas was sitting up in the chair attempting to each lunch. He is alert and  cooperative however he is a poor historian given his dementia. On assessment he was alert to self. Able to verify his name and dob only. He was able to follow commands and denies pain. I spoke with his wife, Mrs. Pearley Baranek over the phone and we discussed a brief life review of the patient. She reported he was a former smoker and they had been married for over 79 years. They have 3 children, 2 daughters (1 deceased) and a son (whom is greatly involved in his care).   As far as functional and nutritional status, Mrs. Heatwole reported that family has noticed a severe decline in his health over the past 6-8 weeks. She reports "he is just going downhill." Prior to admission he was at [facility] facility for rehabilitation and she has noticed further decline. He was participating in therapy during his stay however, he had fallen several times over the past month. She reports Mr. Blyth stating at times "he felt weak". He was able to ambulate with assistance and a walker until about a week ago when his legs began swelling, and it seemed harder for him to move around. She also reports over the past 6-8 weeks a significant decrease  in his appetite and po intake. She stated she could barely get him to eat more than 10-20% of his meals. He would do ok with ensure supplements at times. She verbalizes that she is uncertain of his current weight but knows he has lost at least 20lbs over the past month.   We discussed his current illness and what it means in the larger context of his on-going co-morbidities.  Natural disease trajectory and expectations at EOL were discussed. Wife was very accepting of this conversation, however states she needed to talk things over with her son and decide were to go from here. She verbalized that they did not want him returning to [facility] facility as they were not pleased with the care he received. However, she feels she is not capable of caring for him in the home by  herself.   Family goals of care meeting is scheduled for tomorrow 2/27 @ 1530 with wife and son.   Questions and concerns were addressed.  The family was encouraged to call with questions or concerns.  PMT will continue to support holistically and make any further recommendations post [place] meeting.   Primary Decision Maker: [name] Andreas-Wife     SUMMARY OF RECOMMENDATIONS    DNR/DNI  Agree with attending continue treating the treatable  Palliative to meet with family on 2/27 @1530  for goals of care  Agree with ensure for supplemental nutritional support per Dietitian   Code Status/Advance Care Planning:  DNR   Additional Recommendations (Limitations, Scope, Preferences):  Full Scope Treatment-continue treating the treatable, per attending, family requesting no work-up in regards to new lung cancer dx.   Psycho-social/Spiritual:   Desire for further Chaplaincy support:NO  Additional Recommendations: Education on Hospice  Prognosis:   Unable to determine-Guarded with anticipation of poor prognosis given new malignancy, dementia, failure to thrive, very limited mobility, minimal PO intake with an albumin of 1.7 and other co morbidities.   Discharge Planning: To Be Determined      Primary Diagnoses: Present on Admission: . Lung cancer ([place]) . PAF (paroxysmal atrial fibrillation) ([place]) . Malnutrition of moderate degree . Hypoalbuminemia . HTN (hypertension) . Dementia . CAD (coronary artery disease), native coronary artery . BPH (benign prostatic hyperplasia) . UTI (urinary tract infection) due to urinary indwelling Foley catheter ([place])   I have reviewed the medical record, interviewed the patient and family, and examined the patient. The following aspects are pertinent.  Past Medical History:  Diagnosis Date  . Abdominal aortic aneurysm (AAA) ([place])   . Arthritis    "shoulders" (04/24/2017)  . Atrial fibrillation with RVR ([place]) 03/2017   [name] 03/08/2017   . COPD (chronic obstructive pulmonary disease) ([place])   . Coronary artery disease   . Dementia   . Fall at home 06/17/2017   nasal fracture  . High cholesterol   . HOH (hard of hearing)   . Hypertension   . Pneumonia ~ 2003-2008 X 5   "5 straight years S/P pneumonia shot in ~ 2003"(04/24/2017)  . Skin cancer 2018   "cut it out of right arm"   Social History   Socioeconomic History  . Marital status: Married    Spouse name: None  . Number of children: None  . Years of education: None  . Highest education level: None  Social Needs  . Financial resource strain: None  . Food insecurity - worry: None  . Food insecurity - inability: None  . Transportation needs - medical: None  . Transportation needs -  non-medical: None  Occupational History  . Occupation: Retired    Comment: Chief Technology Officer  . Smoking status: Current Every Day Smoker    Packs/day: 0.10    Years: 65.00    Pack years: 6.50    Types: Cigarettes  . Smokeless tobacco: Former Systems developer  . Tobacco comment: smokes cigarettes about 2/day now  Substance and Sexual Activity  . Alcohol use: No  . Drug use: No  . Sexual activity: No  Other Topics Concern  . None  Social History Narrative  . None   Family History  Problem Relation Age of Onset  . Heart attack Father   . Cancer Sister   . Lung cancer Brother    Scheduled Meds: . amiodarone  200 mg Oral Daily  . aspirin EC  81 mg Oral Daily  . atorvastatin  10 mg Oral q1800  . digoxin  0.125 mg Oral Daily  . diltiazem  60 mg Oral Q8H  . dutasteride  0.5 mg Oral Daily   And  . tamsulosin  0.4 mg Oral Daily  . enoxaparin (LOVENOX) injection  40 mg Subcutaneous Q24H  . feeding supplement (ENSURE ENLIVE)  237 mL Oral TID WC & HS  . folic acid  1 mg Oral Daily  . furosemide  40 mg Oral BID  . megestrol  400 mg Oral BID  . metoprolol tartrate  50 mg Oral BID  . multivitamin with minerals  1 tablet Oral Daily  . polyethylene glycol  17 g Oral Daily  .  QUEtiapine  25 mg Oral Q2000  . senna-docusate  1 tablet Oral BID  . sodium chloride flush  3 mL Intravenous Q12H   Continuous Infusions: . cefTRIAXone (ROCEPHIN)  IV Stopped (07/30/17 2201)   PRN Meds:.acetaminophen **OR** acetaminophen, clonazepam, ondansetron **OR** ondansetron (ZOFRAN) IV Medications Prior to Admission:  Prior to Admission medications   Medication Sig Start Date End Date Taking? Authorizing Provider  acetaminophen (TYLENOL) 325 MG tablet Take 1-2 tablets (325-650 mg total) by mouth every 4 (four) hours as needed for mild pain. 07/09/17  Yes Love, Ivan Anchors, PA-C  amiodarone (PACERONE) 200 MG tablet Take 1 tablet (200 mg total) by mouth daily. 07/10/17  Yes Love, Ivan Anchors, PA-C  aspirin EC 81 MG tablet Take 1 tablet (81 mg total) by mouth daily. 07/18/17 07/18/18 Yes Florencia Reasons, MD  atorvastatin (LIPITOR) 10 MG tablet Take 1 tablet (10 mg total) by mouth daily. 07/10/17  Yes Love, Ivan Anchors, PA-C  clonazePAM (KLONOPIN) 0.5 MG tablet Take 0.5 tablets (0.25 mg total) by mouth 3 (three) times daily as needed (severe agitation). 07/18/17  Yes Florencia Reasons, MD  digoxin (LANOXIN) 0.125 MG tablet Take 1 tablet (0.125 mg total) by mouth daily. 07/10/17  Yes Love, Ivan Anchors, PA-C  diltiazem (CARDIZEM) 60 MG tablet Take 1 tablet (60 mg total) by mouth every 8 (eight) hours. 07/10/17  Yes Love, Ivan Anchors, PA-C  diphenhydrAMINE (BENADRYL) 25 mg capsule Take 1 capsule (25 mg total) by mouth every 6 (six) hours as needed for itching or sleep (agitation). 07/18/17  Yes Florencia Reasons, MD  Dutasteride-Tamsulosin HCl (JALYN) 0.5-0.4 MG CAPS Take 1 tablet by mouth daily. 07/10/17  Yes Love, Ivan Anchors, PA-C  feeding supplement, ENSURE ENLIVE, (ENSURE ENLIVE) LIQD Take 237 mLs by mouth 4 (four) times daily -  with meals and at bedtime. 07/09/17  Yes Love, Ivan Anchors, PA-C  furosemide (LASIX) 40 MG tablet Take 40 mg by mouth 2 (two) times daily.  Yes [provider]  megestrol (MEGACE) 400 MG/10ML suspension Take 10 mLs  (400 mg total) by mouth 2 (two) times daily. 07/10/17  Yes Love, Ivan Anchors, PA-C  Melatonin 3 MG TABS Take 3 mg by mouth at bedtime.   Yes [provider]  metoprolol tartrate (LOPRESSOR) 50 MG tablet Take 1 tablet (50 mg total) by mouth 2 (two) times daily. 06/25/17  Yes Sheikh, Omair Latif, DO  Multiple Vitamin (MULTIVITAMIN WITH MINERALS) TABS tablet Take 1 tablet by mouth daily. 07/10/17  Yes Love, Ivan Anchors, PA-C  polyethylene glycol (MIRALAX / GLYCOLAX) packet Take 17 g by mouth daily. 06/26/17  Yes Sheikh, Omair Latif, DO  QUEtiapine (SEROQUEL) 25 MG tablet Take 1 tablet (25 mg total) by mouth daily at 8 pm. 07/10/17  Yes Love, Ivan Anchors, PA-C  senna-docusate (SENOKOT-S) 8.6-50 MG tablet Take 1 tablet by mouth 2 (two) times daily. 07/18/17  Yes Florencia Reasons, MD   Allergies  Allergen Reactions  . Phenergan [Promethazine Hcl] Other (See Comments)    Confusion    Review of Systems-Unable to assess due to dementia   Physical Exam  Cardiovascular: Normal rate and normal pulses. An irregular rhythm present.  Left leg edema  Pulmonary/Chest: Effort normal. He has decreased breath sounds.  Musculoskeletal:  Generalized weakness   Neurological: He is disoriented.  Hx of dementia, alert to self only.     Vital Signs: BP (!) 107/57 (BP Location: Right Arm)   Pulse 88   Temp 98.1 F (36.7 C) (Oral)   Resp 16   Ht 5\' 9"  (1.753 m)   Wt 69.9 kg (154 lb)   SpO2 99%   BMI 22.74 kg/m  Pain Assessment: No/denies pain   Pain Score: 0-No pain   SpO2: SpO2: 99 % O2 Device:SpO2: 99 % O2 Flow Rate: .O2 Flow Rate (L/min): 2 L/min  IO: Intake/output summary:   Intake/Output Summary (Last 24 hours) at 07/31/2017 1343 Last data filed at 07/31/2017 1122 Gross per 24 hour  Intake 974 ml  Output 150 ml  Net 824 ml    LBM:   Baseline Weight: Weight: 69.9 kg (154 lb) Most recent weight: Weight: 69.9 kg (154 lb)     Palliative Assessment/Data:PPS 30%     Time In: 1300 Time Out: 1345 Time  Total: 45 min.   Greater than 50%  of this time was spent counseling and coordinating care related to the above assessment and plan.  Signed by: Alda Lea, AGNP-C Florentina Jenny, PA-C Palliative Medicine Team  Phone: [phone] Fax: [phone]    Please contact Palliative Medicine Team phone at [phone] for questions and concerns.  For individual provider: See Shea Evans

## 2017-07-31 NOTE — Progress Notes (Signed)
Palliative Medicine consult noted. Due to high referral volume, there may be a delay seeing this patient. Please call the Palliative Medicine Team office at [phone] if recommendations are needed in the interim.  Thank you for inviting [place] to see this patient.  Marjie Skiff Johnathon Olden, RN, BSN, [facility] Palliative Medicine Team 07/31/2017 8:16 AM Office [phone]

## 2017-07-31 NOTE — Progress Notes (Signed)
PROGRESS NOTE    Jeremy Macdonald  TMA:[id number] DOB: Dec 26, 1925 DOA: 07/30/2017 PCP: Vernie Shanks, MD   Outpatient Specialists:     Brief Narrative:  84 y.o. male  admitted on 07/30/2017 with increased confusion, failure to thrive, and leg swelling. He has a past medical history of A-Fib, CAD S/P PCI 11/18, COPD, dementia, recurrent fall (one a month ago and one recently in SNF), hematuria with chronic indwelling Foley catheter.  He has has 3 recent hospitalizations in the last 2 months and 4 in last 6 months.     Assessment & Plan:   Principal Problem:   Lung cancer ([place]) Active Problems:   PAF (paroxysmal atrial fibrillation) (HCC)   CAD (coronary artery disease), native coronary artery   Dementia   BPH (benign prostatic hyperplasia)   Malnutrition of moderate degree   Hypoalbuminemia   HTN (hypertension)   UTI (urinary tract infection) due to urinary indwelling Foley catheter (HCC)   Protein-calorie malnutrition, severe   Lung cancer ([place]) -significant failure to thrive. -CT scan of the chest was performed which shows a 3 cm x 4.5 cm lobulated irregular mass in the right lobe along with a 14 mm hilar lymph node consistent with a possible malignancy as well as metastatic disease. - not a great candidate for any chemotherapy or radiation going in the future given his multiple comorbidities as well as severe dementia and they agreed with it. -palliative care meeting in the AM  Failure to thrive, leg edema. -see above re: palliative care meeting  Acute encephalopathy. Metabolic likely in the setting of UTI has Chronic indwelling Foley catheter IV ceftriaxone. Follow-up on the culture. Patient's catheter is difficult to change and need urology consultation if it is to be changed  Protein calorie Malnutrition. Moderate. SLP eval: regular thin with aspiration precautions  chronic A. fib. Still in A. fib. Continue digoxin, Cardizem,  Lopressor  Hypothyroidism. TSH is elevated -free t4 normal  Low B12 -will replace IM if not made comfort care   History of CAD with Elevated troponin. Not consistent with ACS pattern. -more likely demand ischemia.  Max assist with PT/OT    DVT prophylaxis:  SCD's  Code Status: DNR  Family Communication: Wife at bedside  Disposition Plan:  Palliative care meeting   Consultants:   Palliative care    Subjective: Working with speech  Objective: Vitals:   07/30/17 2200 07/30/17 2300 07/31/17 0700 07/31/17 1415  BP: 134/89 117/72 (!) 107/57 109/60  Pulse: (!) 50 94 88 84  Resp: 18  16 17   Temp:  98.1 F (36.7 C) 98.1 F (36.7 C) 98.2 F (36.8 C)  TempSrc:  Oral Oral Oral  SpO2: 94% 94% 99% 99%  Weight:      Height:        Intake/Output Summary (Last 24 hours) at 07/31/2017 1518 Last data filed at 07/31/2017 1416 Gross per 24 hour  Intake 1094 ml  Output 150 ml  Net 944 ml   Filed Weights   07/30/17 1408  Weight: 69.9 kg (154 lb)    Examination:  General exam: in chair- appears stated age, frail Respiratory system: diminished Cardiovascular system: S1 & S2 heard, RRR. No JVD, murmurs, rubs, gallops or clicks. No pedal edema. Gastrointestinal system: +BS, soft Central nervous system: Alert     Data Reviewed: I have personally reviewed following labs and imaging studies  CBC: Recent Labs  Lab 07/30/17 1545 07/31/17 0936  WBC 9.7 8.8  NEUTROABS 7.9* 6.6  HGB  9.6* 10.0*  HCT 31.4* 32.8*  MCV 93.5 92.4  PLT 222 616   Basic Metabolic Panel: Recent Labs  Lab 07/30/17 1545 07/31/17 1157  NA 140 138  K 4.1 4.0  CL 100* 101  CO2 30 27  GLUCOSE 132* 132*  BUN 26* 22*  CREATININE 1.14 0.97  CALCIUM 8.3* 8.1*   GFR: Estimated Creatinine Clearance: 51 mL/min (by C-G formula based on SCr of 0.97 mg/dL). Liver Function Tests: Recent Labs  Lab 07/30/17 1545 07/31/17 1157  AST 31 26  ALT 25 22  ALKPHOS 56 54  BILITOT 0.6 0.6   PROT 5.8* 5.4*  ALBUMIN 1.8* 1.7*   No results for input(s): LIPASE, AMYLASE in the last 168 hours. Recent Labs  Lab 07/30/17 1545  AMMONIA <9*   Coagulation Profile: No results for input(s): INR, PROTIME in the last 168 hours. Cardiac Enzymes: Recent Labs  Lab 07/30/17 1545 07/30/17 2059 07/31/17 0320 07/31/17 0936  TROPONINI 0.06* 0.06* 0.06* 0.06*   BNP (last 3 results) No results for input(s): PROBNP in the last 8760 hours. HbA1C: No results for input(s): HGBA1C in the last 72 hours. CBG: No results for input(s): GLUCAP in the last 168 hours. Lipid Profile: No results for input(s): CHOL, HDL, LDLCALC, TRIG, CHOLHDL, LDLDIRECT in the last 72 hours. Thyroid Function Tests: Recent Labs    07/30/17 1545 07/30/17 2059  TSH 7.424*  --   FREET4  --  0.95   Anemia Panel: Recent Labs    07/30/17 2059  VITAMINB12 175*   Urine analysis:    Component Value Date/Time   COLORURINE YELLOW 07/30/2017 1558   APPEARANCEUR CLOUDY (A) 07/30/2017 1558   LABSPEC 1.011 07/30/2017 1558   PHURINE 7.0 07/30/2017 1558   GLUCOSEU NEGATIVE 07/30/2017 1558   HGBUR LARGE (A) 07/30/2017 1558   BILIRUBINUR NEGATIVE 07/30/2017 1558   KETONESUR NEGATIVE 07/30/2017 1558   PROTEINUR NEGATIVE 07/30/2017 1558   UROBILINOGEN 1.0 08/15/2009 1743   NITRITE NEGATIVE 07/30/2017 1558   LEUKOCYTESUR LARGE (A) 07/30/2017 1558     )No results found for this or any previous visit (from the past 240 hour(s)).    Anti-infectives (From admission, onward)   Start     Dose/Rate Route Frequency Ordered Stop   07/30/17 2100  cefTRIAXone (ROCEPHIN) 1 g in sodium chloride 0.9 % 100 mL IVPB     1 g 200 mL/hr over 30 Minutes Intravenous Every 24 hours 07/30/17 2049         Radiology Studies: Dg Elbow Complete Left (3+view)  Result Date: 07/30/2017 CLINICAL DATA:  Fall, elbow pain. EXAM: LEFT ELBOW - COMPLETE 3+ VIEW COMPARISON:  None. FINDINGS: Osseous alignment is normal. No fracture line or  displaced fracture fragment identified. No significant degenerative change. No appreciable joint effusion and adjacent soft tissues are unremarkable. IMPRESSION: Negative. Electronically Signed   By: Franki Cabot M.D.   On: 07/30/2017 16:43   Dg Wrist Complete Left  Result Date: 07/30/2017 CLINICAL DATA:  Fall, elbow and wrist pain.  Bruising to left wrist. EXAM: LEFT WRIST - COMPLETE 3+ VIEW COMPARISON:  None. FINDINGS: Osseous alignment is normal. No fracture line or displaced fracture fragment identified. Soft tissues about the left wrist are unremarkable. IMPRESSION: Negative. Electronically Signed   By: Franki Cabot M.D.   On: 07/30/2017 16:42   Ct Head Wo Contrast  Result Date: 07/30/2017 CLINICAL DATA:  84 year old male with steady decline. Initial encounter. EXAM: CT HEAD WITHOUT CONTRAST TECHNIQUE: Contiguous axial images were obtained from the  base of the skull through the vertex without intravenous contrast. COMPARISON:  06/20/2017. FINDINGS: Brain: No intracranial hemorrhage or CT evidence of large acute infarct. Prominent chronic microvascular changes. Moderate global atrophy. No intracranial mass lesion noted on this unenhanced exam. Vascular: Vascular calcifications Skull: No skull fracture Sinuses/Orbits: No acute orbital abnormality. Minimal mucosal thickening inferior maxillary sinuses. Other: Mastoid air cells and middle ear cavities are clear. IMPRESSION: No acute intracranial abnormality. Specifically, no evidence of intracranial hemorrhage or CT evidence of large acute infarct. Prominent chronic microvascular changes. Atrophy. Electronically Signed   By: Genia Del M.D.   On: 07/30/2017 17:53   Ct Angio Chest Pe W And/or Wo Contrast  Result Date: 07/30/2017 CLINICAL DATA:  Shortness of breath. EXAM: CT ANGIOGRAPHY CHEST WITH CONTRAST TECHNIQUE: Multidetector CT imaging of the chest was performed using the standard protocol during bolus administration of intravenous contrast.  Multiplanar CT image reconstructions and MIPs were obtained to evaluate the vascular anatomy. CONTRAST:  156mL ISOVUE-370 IOPAMIDOL (ISOVUE-370) INJECTION 76% COMPARISON:  Radiograph of same day. FINDINGS: Cardiovascular: Satisfactory opacification of the pulmonary arteries to the segmental level. No evidence of pulmonary embolism. Normal heart size. No pericardial effusion. Coronary artery calcifications are noted. Mediastinum/Nodes: Thyroid gland and esophagus are unremarkable. 14 mm right hilar lymph node is noted. Lungs/Pleura: 29 x 14 mm lobulated irregular density is noted in right lower lobe concerning for malignancy. No pneumothorax or pleural effusion is noted. Mild emphysematous disease is noted in the upper lobes. Probable scarring is noted in superior segment of right lower lobe medially. Mild bibasilar subsegmental atelectasis is noted. Upper Abdomen: Probable hepatic cysts are noted. Musculoskeletal: No chest wall abnormality. No acute or significant osseous findings. Review of the MIP images confirms the above findings. IMPRESSION: No definite evidence of pulmonary embolus. 29 x 14 mm lobulated irregular density is noted in right lower lobe consistent with malignancy. 14 mm right hilar lymph node is also noted concerning for metastatic disease. Coronary artery calcifications are noted suggesting coronary artery disease. Aortic Atherosclerosis (ICD10-I70.0) and Emphysema (ICD10-J43.9). Electronically Signed   By: Marijo Conception, M.D.   On: 07/30/2017 18:07   Dg Chest Port 1 View  Result Date: 07/30/2017 CLINICAL DATA:  Cough. EXAM: PORTABLE CHEST 1 VIEW COMPARISON:  06/20/2017 and back to 05/21/2014. FINDINGS: Numerous leads and wires project over the chest. Midline trachea. Moderate cardiomegaly. Atherosclerosis in the transverse aorta. No pleural effusion or pneumothorax. Hyperinflation. Right mid lung spiculated density measures on the order of 3.0 x 2.1 cm. IMPRESSION: Right mid lung  spiculated density is suspicious for primary bronchogenic carcinoma. Further evaluation with chest CT (ideally with contrast) recommended. Cardiomegaly.  Aortic Atherosclerosis (ICD10-I70.0). These results were called by telephone at the time of interpretation on 07/30/2017 at 3:46 pm to [facility], r.n., who verbally acknowledged these results. Electronically Signed   By: Abigail Miyamoto M.D.   On: 07/30/2017 15:49   Dg Shoulder Left  Result Date: 07/30/2017 CLINICAL DATA:  Fall.  Bruising. EXAM: LEFT SHOULDER - 2+ VIEW COMPARISON:  06/20/2017 chest radiograph. FINDINGS: AP and scapular views. Visualized portion of the left hemithorax is normal. No acute fracture or dislocation. IMPRESSION: No acute osseous abnormality. Electronically Signed   By: Abigail Miyamoto M.D.   On: 07/30/2017 16:42        Scheduled Meds: . amiodarone  200 mg Oral Daily  . aspirin EC  81 mg Oral Daily  . atorvastatin  10 mg Oral q1800  . digoxin  0.125 mg Oral Daily  .  diltiazem  60 mg Oral Q8H  . dutasteride  0.5 mg Oral Daily   And  . tamsulosin  0.4 mg Oral Daily  . enoxaparin (LOVENOX) injection  40 mg Subcutaneous Q24H  . feeding supplement (ENSURE ENLIVE)  237 mL Oral TID WC & HS  . folic acid  1 mg Oral Daily  . furosemide  40 mg Oral BID  . megestrol  400 mg Oral BID  . metoprolol tartrate  50 mg Oral BID  . multivitamin with minerals  1 tablet Oral Daily  . polyethylene glycol  17 g Oral Daily  . QUEtiapine  25 mg Oral Q2000  . senna-docusate  1 tablet Oral BID  . sodium chloride flush  3 mL Intravenous Q12H   Continuous Infusions: . cefTRIAXone (ROCEPHIN)  IV Stopped (07/30/17 2201)     LOS: 0 days    Time spent: 35 min    Geradine Girt, DO [facility] Pager [phone]  If 7PM-7AM, please contact night-coverage [email or web address] Password [facility] 07/31/2017, 3:18 PM

## 2017-08-01 DIAGNOSIS — I2510 Atherosclerotic heart disease of native coronary artery without angina pectoris: Secondary | ICD-10-CM

## 2017-08-01 DIAGNOSIS — L8990 Pressure ulcer of unspecified site, unspecified stage: Secondary | ICD-10-CM

## 2017-08-01 DIAGNOSIS — R748 Abnormal levels of other serum enzymes: Secondary | ICD-10-CM

## 2017-08-01 MED ORDER — AMOXICILLIN 500 MG PO CAPS
500.0000 mg | ORAL_CAPSULE | Freq: Two times a day (BID) | ORAL | Status: DC
  Administered 2017-08-01 – 2017-08-03 (×5): 500 mg via ORAL
  Filled 2017-08-01 (×5): qty 1

## 2017-08-01 MED ORDER — HYDROCODONE-HOMATROPINE 5-1.5 MG/5ML PO SYRP: 5.0000 mL | ORAL_SOLUTION | Freq: Four times a day (QID) | ORAL | Status: DC | PRN

## 2017-08-01 MED ORDER — MORPHINE SULFATE (PF) 2 MG/ML IV SOLN: 2.0000 mg | INTRAVENOUS | Status: DC | PRN

## 2017-08-01 NOTE — Progress Notes (Signed)
PROGRESS NOTE    Jeremy Macdonald  XBM:[id number] DOB: 07-Nov-1925 DOA: 07/30/2017 PCP: Vernie Shanks, MD   Outpatient Specialists:     Brief Narrative:  84 y.o. male  admitted on 07/30/2017 with increased confusion, failure to thrive, and leg swelling. He has a past medical history of A-Fib, CAD S/P PCI 11/18, COPD, dementia, recurrent fall (one a month ago and one recently in SNF), hematuria with chronic indwelling Foley catheter.  He has has 3 recent hospitalizations in the last 2 months and 4 in last 6 months.     Assessment & Plan:   Principal Problem:   Lung cancer ([place]) Active Problems:   PAF (paroxysmal atrial fibrillation) (HCC)   CAD (coronary artery disease), native coronary artery   Dementia   BPH (benign prostatic hyperplasia)   Malnutrition of moderate degree   Hypoalbuminemia   HTN (hypertension)   UTI (urinary tract infection) due to urinary indwelling Foley catheter (HCC)   Protein-calorie malnutrition, severe   Pressure injury of skin   Lung cancer (HCC) -significant failure to thrive. -CT scan of the chest was performed which shows a 3 cm x 4.5 cm lobulated irregular mass in the right lobe along with a 14 mm hilar lymph node consistent with a possible malignancy as well as metastatic disease. - not a great candidate for any chemotherapy or radiation going in the future given his multiple comorbidities as well as severe dementia and they agreed with it. -palliative care meeting today  Failure to thrive, leg edema. -see above re: palliative care meeting  Acute encephalopathy. Metabolic likely in the setting of UTI has Chronic indwelling Foley catheter PO abx Patient's catheter is difficult to change and need urology consultation if it is to be changed  Protein calorie Malnutrition. Moderate. SLP eval: regular thin with aspiration precautions  chronic A. fib. Continue digoxin, Cardizem, Lopressor  Abnormal TSH TSH is elevated -free t4  normal  Low B12 -will replace IM if not made comfort care   History of CAD with Elevated troponin. Not consistent with ACS pattern. -more likely demand ischemia.      DVT prophylaxis:  SCD's  Code Status: DNR  Family Communication: Wife at bedside 2/26- no family present yet today  Disposition Plan:  Palliative care meeting   Consultants:   Palliative care    Subjective: Michela Pitcher he ate a "good" breakfast  Objective: Vitals:   08/01/17 0530 08/01/17 0600 08/01/17 0840 08/01/17 1406  BP: (!) 107/58  (!) 110/50 123/87  Pulse: 70  75 80  Resp: 18   18  Temp: 98.1 F (36.7 C)   98.3 F (36.8 C)  TempSrc: Axillary   Oral  SpO2: 98%  99% 94%  Weight:  70.3 kg (154 lb 15.7 oz)    Height:        Intake/Output Summary (Last 24 hours) at 08/01/2017 1554 Last data filed at 08/01/2017 1406 Gross per 24 hour  Intake 1435 ml  Output 700 ml  Net 735 ml   Filed Weights   07/30/17 1408 08/01/17 0600  Weight: 69.9 kg (154 lb) 70.3 kg (154 lb 15.7 oz)    Examination:  General exam: in bed- frail appearing, wearing mittens Respiratory system: wet upper airway, no increase work of breathing Cardiovascular system: rrr Gastrointestinal system: +BS Central nervous system: alert and conversant but speech difficult to understand     Data Reviewed: I have personally reviewed following labs and imaging studies  CBC: Recent Labs  Lab 07/30/17 1545  07/31/17 0936  WBC 9.7 8.8  NEUTROABS 7.9* 6.6  HGB 9.6* 10.0*  HCT 31.4* 32.8*  MCV 93.5 92.4  PLT 222 563   Basic Metabolic Panel: Recent Labs  Lab 07/30/17 1545 07/31/17 1157  NA 140 138  K 4.1 4.0  CL 100* 101  CO2 30 27  GLUCOSE 132* 132*  BUN 26* 22*  CREATININE 1.14 0.97  CALCIUM 8.3* 8.1*   GFR: Estimated Creatinine Clearance: 51.3 mL/min (by C-G formula based on SCr of 0.97 mg/dL). Liver Function Tests: Recent Labs  Lab 07/30/17 1545 07/31/17 1157  AST 31 26  ALT 25 22  ALKPHOS 56 54    BILITOT 0.6 0.6  PROT 5.8* 5.4*  ALBUMIN 1.8* 1.7*   No results for input(s): LIPASE, AMYLASE in the last 168 hours. Recent Labs  Lab 07/30/17 1545  AMMONIA <9*   Coagulation Profile: No results for input(s): INR, PROTIME in the last 168 hours. Cardiac Enzymes: Recent Labs  Lab 07/30/17 1545 07/30/17 2059 07/31/17 0320 07/31/17 0936  TROPONINI 0.06* 0.06* 0.06* 0.06*   BNP (last 3 results) No results for input(s): PROBNP in the last 8760 hours. HbA1C: No results for input(s): HGBA1C in the last 72 hours. CBG: Recent Labs  Lab 07/31/17 2052  GLUCAP 129*   Lipid Profile: No results for input(s): CHOL, HDL, LDLCALC, TRIG, CHOLHDL, LDLDIRECT in the last 72 hours. Thyroid Function Tests: Recent Labs    07/30/17 1545 07/30/17 2059  TSH 7.424*  --   FREET4  --  0.95   Anemia Panel: Recent Labs    07/30/17 2059  VITAMINB12 175*   Urine analysis:    Component Value Date/Time   COLORURINE YELLOW 07/30/2017 1558   APPEARANCEUR CLOUDY (A) 07/30/2017 1558   LABSPEC 1.011 07/30/2017 1558   PHURINE 7.0 07/30/2017 1558   GLUCOSEU NEGATIVE 07/30/2017 1558   HGBUR LARGE (A) 07/30/2017 1558   BILIRUBINUR NEGATIVE 07/30/2017 1558   KETONESUR NEGATIVE 07/30/2017 1558   PROTEINUR NEGATIVE 07/30/2017 1558   UROBILINOGEN 1.0 08/15/2009 1743   NITRITE NEGATIVE 07/30/2017 1558   LEUKOCYTESUR LARGE (A) 07/30/2017 1558     ) Recent Results (from the past 240 hour(s))  Urine culture     Status: Abnormal (Preliminary result)   Collection Time: 07/30/17  8:59 PM  Result Value Ref Range Status   Specimen Description URINE, CATHETERIZED  Final   Special Requests NONE  Final   Culture (A)  Final    >=100,000 COLONIES/mL ENTEROCOCCUS FAECALIS SUSCEPTIBILITIES TO FOLLOW Performed at [facility], [place] [address] [place], [place] [address]    Report Status PENDING  Incomplete      Anti-infectives (From admission, onward)   Start     Dose/Rate Route Frequency  Ordered Stop   08/01/17 1400  amoxicillin (AMOXIL) capsule 500 mg     500 mg Oral Every 12 hours 08/01/17 1332 08/08/17 0959   07/30/17 2100  cefTRIAXone (ROCEPHIN) 1 g in sodium chloride 0.9 % 100 mL IVPB  Status:  Discontinued     1 g 200 mL/hr over 30 Minutes Intravenous Every 24 hours 07/30/17 2049 08/01/17 1332       Radiology Studies: Dg Elbow Complete Left (3+view)  Result Date: 07/30/2017 CLINICAL DATA:  Fall, elbow pain. EXAM: LEFT ELBOW - COMPLETE 3+ VIEW COMPARISON:  None. FINDINGS: Osseous alignment is normal. No fracture line or displaced fracture fragment identified. No significant degenerative change. No appreciable joint effusion and adjacent soft tissues are unremarkable. IMPRESSION: Negative. Electronically Signed  By: Franki Cabot M.D.   On: 07/30/2017 16:43   Dg Wrist Complete Left  Result Date: 07/30/2017 CLINICAL DATA:  Fall, elbow and wrist pain.  Bruising to left wrist. EXAM: LEFT WRIST - COMPLETE 3+ VIEW COMPARISON:  None. FINDINGS: Osseous alignment is normal. No fracture line or displaced fracture fragment identified. Soft tissues about the left wrist are unremarkable. IMPRESSION: Negative. Electronically Signed   By: Franki Cabot M.D.   On: 07/30/2017 16:42   Ct Head Wo Contrast  Result Date: 07/30/2017 CLINICAL DATA:  84 year old male with steady decline. Initial encounter. EXAM: CT HEAD WITHOUT CONTRAST TECHNIQUE: Contiguous axial images were obtained from the base of the skull through the vertex without intravenous contrast. COMPARISON:  06/20/2017. FINDINGS: Brain: No intracranial hemorrhage or CT evidence of large acute infarct. Prominent chronic microvascular changes. Moderate global atrophy. No intracranial mass lesion noted on this unenhanced exam. Vascular: Vascular calcifications Skull: No skull fracture Sinuses/Orbits: No acute orbital abnormality. Minimal mucosal thickening inferior maxillary sinuses. Other: Mastoid air cells and middle ear cavities are  clear. IMPRESSION: No acute intracranial abnormality. Specifically, no evidence of intracranial hemorrhage or CT evidence of large acute infarct. Prominent chronic microvascular changes. Atrophy. Electronically Signed   By: Genia Del M.D.   On: 07/30/2017 17:53   Ct Angio Chest Pe W And/or Wo Contrast  Result Date: 07/30/2017 CLINICAL DATA:  Shortness of breath. EXAM: CT ANGIOGRAPHY CHEST WITH CONTRAST TECHNIQUE: Multidetector CT imaging of the chest was performed using the standard protocol during bolus administration of intravenous contrast. Multiplanar CT image reconstructions and MIPs were obtained to evaluate the vascular anatomy. CONTRAST:  138mL ISOVUE-370 IOPAMIDOL (ISOVUE-370) INJECTION 76% COMPARISON:  Radiograph of same day. FINDINGS: Cardiovascular: Satisfactory opacification of the pulmonary arteries to the segmental level. No evidence of pulmonary embolism. Normal heart size. No pericardial effusion. Coronary artery calcifications are noted. Mediastinum/Nodes: Thyroid gland and esophagus are unremarkable. 14 mm right hilar lymph node is noted. Lungs/Pleura: 29 x 14 mm lobulated irregular density is noted in right lower lobe concerning for malignancy. No pneumothorax or pleural effusion is noted. Mild emphysematous disease is noted in the upper lobes. Probable scarring is noted in superior segment of right lower lobe medially. Mild bibasilar subsegmental atelectasis is noted. Upper Abdomen: Probable hepatic cysts are noted. Musculoskeletal: No chest wall abnormality. No acute or significant osseous findings. Review of the MIP images confirms the above findings. IMPRESSION: No definite evidence of pulmonary embolus. 29 x 14 mm lobulated irregular density is noted in right lower lobe consistent with malignancy. 14 mm right hilar lymph node is also noted concerning for metastatic disease. Coronary artery calcifications are noted suggesting coronary artery disease. Aortic Atherosclerosis  (ICD10-I70.0) and Emphysema (ICD10-J43.9). Electronically Signed   By: Marijo Conception, M.D.   On: 07/30/2017 18:07   Dg Shoulder Left  Result Date: 07/30/2017 CLINICAL DATA:  Fall.  Bruising. EXAM: LEFT SHOULDER - 2+ VIEW COMPARISON:  06/20/2017 chest radiograph. FINDINGS: AP and scapular views. Visualized portion of the left hemithorax is normal. No acute fracture or dislocation. IMPRESSION: No acute osseous abnormality. Electronically Signed   By: Abigail Miyamoto M.D.   On: 07/30/2017 16:42        Scheduled Meds: . amiodarone  200 mg Oral Daily  . amoxicillin  500 mg Oral Q12H  . aspirin EC  81 mg Oral Daily  . atorvastatin  10 mg Oral q1800  . digoxin  0.125 mg Oral Daily  . diltiazem  60 mg Oral Q8H  .  dutasteride  0.5 mg Oral Daily   And  . tamsulosin  0.4 mg Oral Daily  . enoxaparin (LOVENOX) injection  40 mg Subcutaneous Q24H  . feeding supplement (ENSURE ENLIVE)  237 mL Oral TID WC & HS  . folic acid  1 mg Oral Daily  . furosemide  40 mg Oral BID  . megestrol  400 mg Oral BID  . metoprolol tartrate  50 mg Oral BID  . multivitamin with minerals  1 tablet Oral Daily  . polyethylene glycol  17 g Oral Daily  . QUEtiapine  25 mg Oral Q2000  . senna-docusate  1 tablet Oral BID  . sodium chloride flush  3 mL Intravenous Q12H   Continuous Infusions:    LOS: 1 day    Time spent: 25 min    Geradine Girt, DO [facility] Pager [phone]  If 7PM-7AM, please contact night-coverage [email or web address] Password [facility] 08/01/2017, 3:54 PM

## 2017-08-01 NOTE — Clinical Social Work Note (Signed)
Clinical Social Work Assessment  Patient Details  Name: Jeremy Macdonald MRN: [id number] Date of Birth: [birth year]  Date of referral:  08/01/17               Reason for consult:  Facility Placement                Permission sought to share information with:  Facility Sport and exercise psychologist, Family Supports Permission granted to share information::  No  Name::     Jeremy Macdonald  Agency::  SNFs  Relationship::  Spouse/Son  Contact Information:  [phone]  Housing/Transportation Living arrangements for the past 2 months:  Apartment Source of Information:  Spouse Patient Interpreter Needed:  None Criminal Activity/Legal Involvement Pertinent to Current Situation/Hospitalization:  No - Comment as needed Significant Relationships:  Adult Children, Spouse Lives with:  Spouse Do you feel safe going back to the place where you live?  No Need for family participation in patient care:  Yes (Comment)  Care giving concerns:  CSW received consult for possible SNF placement at time of discharge. CSW spoke with patient's spouse regarding PT recommendation of SNF placement at time of discharge. Patient's spouse reported that patient's spouse is currently unable to care for patient at their home given patient's current physical needs and fall risk. Patient's spouse expressed understanding of PT recommendation and is agreeable to SNF placement at time of discharge. CSW to continue to follow and assist with discharge planning needs.   Social Worker assessment / plan:  CSW spoke with patient's spouse concerning possibility of rehab at [facility] before returning home.  Employment status:  Retired Forensic scientist:  Medicare PT Recommendations:  [place] / Referral to community resources:  [place]  Patient/Family's Response to care: Patient's spouse recognizes need for rehab before returning home and is agreeable to a SNF in [place]. She  did not like the care at [facility] at all and would rather take patient home but can't lift him. CSW provided bed offers. She will speak with CSW again after her meeting with palliative.   Patient/Family's Understanding of and Emotional Response to Diagnosis, Current Treatment, and Prognosis:  Patient/family is realistic regarding therapy needs and expressed being hopeful for SNF placement. Patient's spouse expressed understanding of CSW role and discharge process as well as medical condition. No questions/concerns about plan or treatment.    Emotional Assessment Appearance:  Appears stated age Attitude/Demeanor/Rapport:  Unable to Assess Affect (typically observed):  Unable to Assess Orientation:  Oriented to Self Alcohol / Substance use:  Not Applicable Psych involvement (Current and /or in the community):  No (Comment)  Discharge Needs  Concerns to be addressed:  Care Coordination Readmission within the last 30 days:  Yes Current discharge risk:  None Barriers to Discharge:  Continued Medical Work up   [employer], [place] 08/01/2017, 10:56 AM

## 2017-08-01 NOTE — Progress Notes (Signed)
Daily Progress Note   Patient Name: Jeremy Macdonald       Date: 08/01/2017 DOB: 1926/04/02  Age: 84 y.o. MRN#: [id number] Attending Physician: Geradine Girt, DO Primary Care Physician: Vernie Shanks, MD Admit Date: 07/30/2017  Reason for Consultation/Follow-up: Establishing goals of care, Non pain symptom management, Pain control and Psychosocial/spiritual support  Subjective: Patient asleep in bed with wife and son at bedside. Easily aroused. Wife reports noticing and increase in cough and congestion.  PMT concerned for possible aspiration. Increase in confusion today.  Pt was able to verify name and dob yesterday.   Met with wife ([name]) and son [name]) to discuss Mr. Golaszewski current illness and what it means in the larger context of his on-going co-morbidities.  Natural disease trajectory and expectations at EOL were discussed. Values and goals of care important to the patient and his family were discussed.   Hospice and Palliative Care services outpatient were explained and offered. Patient is a DNR/DNI. The family agreed that they could not provide 24/7 assistance in the home.  We discussed options regarding residential vs. In home hospice care. At this time the family would like to have a discussion tonight and review all of their options prior to making a decision. Palliative will plan to follow up with family tomorrow and discuss their decision on disposition and answer any questions they may have.   Chart reviewed.   Length of Stay: 1  Current Medications: Scheduled Meds:  . amiodarone  200 mg Oral Daily  . amoxicillin  500 mg Oral Q12H  . aspirin EC  81 mg Oral Daily  . atorvastatin  10 mg Oral q1800  . digoxin  0.125 mg Oral Daily  . diltiazem  60 mg Oral Q8H  .  dutasteride  0.5 mg Oral Daily   And  . tamsulosin  0.4 mg Oral Daily  . enoxaparin (LOVENOX) injection  40 mg Subcutaneous Q24H  . feeding supplement (ENSURE ENLIVE)  237 mL Oral TID WC & HS  . folic acid  1 mg Oral Daily  . furosemide  40 mg Oral BID  . megestrol  400 mg Oral BID  . metoprolol tartrate  50 mg Oral BID  . multivitamin with minerals  1 tablet Oral Daily  . polyethylene glycol  17 g Oral Daily  .  QUEtiapine  25 mg Oral Q2000  . senna-docusate  1 tablet Oral BID  . sodium chloride flush  3 mL Intravenous Q12H    Continuous Infusions:   PRN Meds: acetaminophen **OR** acetaminophen, clonazepam, ondansetron **OR** ondansetron (ZOFRAN) IV  Physical Exam  Constitutional: He is sleeping. He appears ill.  Cardiovascular: Normal rate and regular rhythm.  Bilateral lower extremity edema   Pulmonary/Chest: He has decreased breath sounds.  Congested cough, unable to cough up sputum   Musculoskeletal:  Generalized weakness   Neurological: He is disoriented.  Psychiatric: Cognition and memory are impaired.            Vital Signs: BP 123/87 (BP Location: Right Arm)   Pulse 80   Temp 98.3 F (36.8 C) (Oral)   Resp 18   Ht _0  (1.753 m)   Wt 70.3 kg (154 lb 15.7 oz)   SpO2 94%   BMI 22.89 kg/m  SpO2: SpO2: 94 % O2 Device: O2 Device: Nasal Cannula O2 Flow Rate: O2 Flow Rate (L/min): 2 L/min  Intake/output summary:   Intake/Output Summary (Last 24 hours) at 08/01/2017 1617 Last data filed at 08/01/2017 1406 Gross per 24 hour  Intake 1435 ml  Output 700 ml  Net 735 ml   LBM: Last BM Date: 08/01/17 Baseline Weight: Weight: 69.9 kg (154 lb) Most recent weight: Weight: 70.3 kg (154 lb 15.7 oz)       Palliative Assessment/Data: PPS 20%      Patient Active Problem List   Diagnosis Date Noted  . Pressure injury of skin 08/01/2017  . Protein-calorie malnutrition, severe 07/31/2017  . Lung cancer ([place]) 07/30/2017  . UTI (urinary tract infection) due to  urinary indwelling Foley catheter ([place]) 07/30/2017  . Hematuria 07/15/2017  . Chronic anticoagulation 07/14/2017  . HTN (hypertension) 07/14/2017  . Pain   . Debility 06/25/2017  . Chronic obstructive pulmonary disease ([place])   . Bilateral hearing loss   . Leukocytosis   . Gross hematuria   . Acute blood loss anemia   . Hypoalbuminemia   . Malnutrition of moderate degree 06/24/2017  . Dementia 06/21/2017  . Hyperglycemia 06/21/2017  . BPH (benign prostatic hyperplasia) 06/21/2017  . Dizziness 06/21/2017  . Atrial fibrillation with rapid ventricular response ([place]) 06/20/2017  . Atrial fibrillation ([place]) 06/20/2017  . Post PTCA 04/24/2017  . CAD (coronary artery disease), native coronary artery 04/22/2017  . Angina pectoris ([place]) 04/22/2017  . PAF (paroxysmal atrial fibrillation) ([place]) 03/18/2017  . Abnormal nuclear stress test 03/18/2017    Palliative Care Assessment & Plan   Assessment:   Lung cancer ([place])   PAF (paroxysmal atrial fibrillation) (HCC)   CAD (coronary artery disease), native coronary artery   Dementia   BPH (benign prostatic hyperplasia)   Malnutrition of moderate degree   Hypoalbuminemia   HTN (hypertension)   UTI (urinary tract infection) due to urinary indwelling Foley catheter (HCC)   Protein-calorie malnutrition, severe   Pressure injury of skin   Recommendations/Plan:  DNR/DNI  Agree with attending continue treating the treatable  Palliative to follow up with family tomorrow regarding disposition plans  Agree with ensure for supplemental nutritional support per Dietitian  Have discontinued statin, aspirin, multi-vitamin  Elevated HOB  Aspiration precautions  Hycodan cough syrup PRN cough  Morphine IV prn SOB/Pain.  Goals of Care and Additional Recommendations:  Limitations on Scope of Treatment: Full Scope Treatment-continue treating the treatable, per attending, family requesting no work-up in regards to new lung cancer dx.  Code  Status:    Code Status Orders  (From admission, onward)        Start     Ordered   07/30/17 2036  Do not attempt resuscitation (DNR)  Continuous    Question Answer Comment  In the event of cardiac or respiratory ARREST Do not call a "code blue"   In the event of cardiac or respiratory ARREST Do not perform Intubation, CPR, defibrillation or ACLS   In the event of cardiac or respiratory ARREST Use medication by any route, position, wound care, and other measures to relive pain and suffering. May use oxygen, suction and manual treatment of airway obstruction as needed for comfort.      07/30/17 2038         Prognosis:   < 2 weeks- given new malignancy, dementia with decreased MS, failure to thrive, very limited mobility, minimal PO intake with an albumin of 1.7, likely aspiration, and other co morbidities.   Discharge Planning:  To Be Determined- Will follow up with family tomorrow for further discussion  Care plan was discussed with family, MD and CSW.   Thank you for allowing the Palliative Medicine Team to assist in the care of this patient.   Total Time 45 min. Prolonged Time Billed  No      Greater than 50%  of this time was spent counseling and coordinating care related to the above assessment and plan.  Alda Lea, AGNP-C [facility] Jenny, [place] Palliative Medicine Team  Phone: [phone] Fax: [phone]   Please contact Palliative Medicine Team phone at [phone] for questions and concerns.

## 2017-08-02 DIAGNOSIS — Z515 Encounter for palliative care: Secondary | ICD-10-CM

## 2017-08-02 DIAGNOSIS — E039 Hypothyroidism, unspecified: Secondary | ICD-10-CM

## 2017-08-02 DIAGNOSIS — T83511S Infection and inflammatory reaction due to indwelling urethral catheter, sequela: Secondary | ICD-10-CM

## 2017-08-02 DIAGNOSIS — N390 Urinary tract infection, site not specified: Secondary | ICD-10-CM

## 2017-08-02 LAB — URINE CULTURE: Culture: >=100000 — AB

## 2017-08-02 MED ORDER — LORAZEPAM 2 MG/ML IJ SOLN: 0.5000 mg | INTRAMUSCULAR | Status: DC | PRN

## 2017-08-02 NOTE — Progress Notes (Signed)
PROGRESS NOTE    Jeremy Macdonald  ONG:[id number] DOB: May 29, 1926 DOA: 07/30/2017 PCP: Vernie Shanks, MD   Outpatient Specialists:     Brief Narrative:  84 y.o. male  admitted on 07/30/2017 with increased confusion, failure to thrive, and leg swelling. He has a past medical history of A-Fib, CAD S/P PCI 11/18, COPD, dementia, recurrent fall (one a month ago and one recently in SNF), hematuria with chronic indwelling Foley catheter.  He has has 3 recent hospitalizations in the last 2 months and 4 in last 6 months.  Goals have shifted to comfort focus and plan to d/c to residential hospice.     Assessment & Plan:   Principal Problem:   Lung cancer ([place]) Active Problems:   PAF (paroxysmal atrial fibrillation) (HCC)   CAD (coronary artery disease), native coronary artery   Dementia   BPH (benign prostatic hyperplasia)   Malnutrition of moderate degree   Hypoalbuminemia   HTN (hypertension)   UTI (urinary tract infection) due to urinary indwelling Foley catheter (HCC)   Protein-calorie malnutrition, severe   Pressure injury of skin   Lung cancer (HCC) -significant failure to thrive. -CT scan of the chest was performed which shows a 3 cm x 4.5 cm lobulated irregular mass in the right lobe along with a 14 mm hilar lymph node consistent with a possible malignancy as well as metastatic disease. - not a great candidate for any chemotherapy or radiation going in the future given his multiple comorbidities as well as severe dementia and they agreed with it. -palliative care meeting on 2/27 and comfort focus  Failure to thrive, leg edema. -see above re: palliative care  Acute encephalopathy. Metabolic likely in the setting of UTI has Chronic indwelling Foley catheter PO abx Patient's catheter is difficult to change and need urology consultation if it is to be changed  Protein calorie Malnutrition. Moderate. SLP eval: regular thin with aspiration precautions  chronic A.  fib. Continue digoxin, Cardizem, Lopressor  Abnormal TSH TSH is elevated -free t4 normal  Low B12 -defer replacement for comfort   History of CAD with Elevated troponin. Not consistent with ACS pattern. -more likely demand ischemia.      DVT prophylaxis:  SCD's  Code Status: DNR  Family Communication: Wife at bedside 2/26- no family present yet today  Disposition Plan:  Residential hospice   Consultants:   Palliative care    Subjective: Sleepy today, mumbles a few words  Objective: Vitals:   08/02/17 0500 08/02/17 0531 08/02/17 0851 08/02/17 1312  BP:  (!) 120/54 128/62 (!) 113/45  Pulse:   74 63  Resp:   20 19  Temp:   98.5 F (36.9 C) 98 F (36.7 C)  TempSrc:   Axillary Oral  SpO2:   100% 100%  Weight: 72.6 kg (160 lb)     Height:        Intake/Output Summary (Last 24 hours) at 08/02/2017 1329 Last data filed at 08/02/2017 0950 Gross per 24 hour  Intake 746 ml  Output 1000 ml  Net -254 ml   Filed Weights   07/30/17 1408 08/01/17 0600 08/02/17 0500  Weight: 69.9 kg (154 lb) 70.3 kg (154 lb 15.7 oz) 72.6 kg (160 lb)    Examination:  General exam: frail, in bed, mitten on hands Respiratory system: coarse upper airway sounds Cardiovascular system: rrr Gastrointestinal system: +Bs, foley in place with some leakage Central nervous system: less responsive this AM     Data Reviewed: I have personally  reviewed following labs and imaging studies  CBC: Recent Labs  Lab 07/30/17 1545 07/31/17 0936  WBC 9.7 8.8  NEUTROABS 7.9* 6.6  HGB 9.6* 10.0*  HCT 31.4* 32.8*  MCV 93.5 92.4  PLT 222 824   Basic Metabolic Panel: Recent Labs  Lab 07/30/17 1545 07/31/17 1157  NA 140 138  K 4.1 4.0  CL 100* 101  CO2 30 27  GLUCOSE 132* 132*  BUN 26* 22*  CREATININE 1.14 0.97  CALCIUM 8.3* 8.1*   GFR: Estimated Creatinine Clearance: 51.6 mL/min (by C-G formula based on SCr of 0.97 mg/dL). Liver Function Tests: Recent Labs  Lab  07/30/17 1545 07/31/17 1157  AST 31 26  ALT 25 22  ALKPHOS 56 54  BILITOT 0.6 0.6  PROT 5.8* 5.4*  ALBUMIN 1.8* 1.7*   No results for input(s): LIPASE, AMYLASE in the last 168 hours. Recent Labs  Lab 07/30/17 1545  AMMONIA <9*   Coagulation Profile: No results for input(s): INR, PROTIME in the last 168 hours. Cardiac Enzymes: Recent Labs  Lab 07/30/17 1545 07/30/17 2059 07/31/17 0320 07/31/17 0936  TROPONINI 0.06* 0.06* 0.06* 0.06*   BNP (last 3 results) No results for input(s): PROBNP in the last 8760 hours. HbA1C: No results for input(s): HGBA1C in the last 72 hours. CBG: Recent Labs  Lab 07/31/17 2052  GLUCAP 129*   Lipid Profile: No results for input(s): CHOL, HDL, LDLCALC, TRIG, CHOLHDL, LDLDIRECT in the last 72 hours. Thyroid Function Tests: Recent Labs    07/30/17 1545 07/30/17 2059  TSH 7.424*  --   FREET4  --  0.95   Anemia Panel: Recent Labs    07/30/17 2059  VITAMINB12 175*   Urine analysis:    Component Value Date/Time   COLORURINE YELLOW 07/30/2017 1558   APPEARANCEUR CLOUDY (A) 07/30/2017 1558   LABSPEC 1.011 07/30/2017 1558   PHURINE 7.0 07/30/2017 1558   GLUCOSEU NEGATIVE 07/30/2017 1558   HGBUR LARGE (A) 07/30/2017 1558   BILIRUBINUR NEGATIVE 07/30/2017 1558   KETONESUR NEGATIVE 07/30/2017 1558   PROTEINUR NEGATIVE 07/30/2017 1558   UROBILINOGEN 1.0 08/15/2009 1743   NITRITE NEGATIVE 07/30/2017 1558   LEUKOCYTESUR LARGE (A) 07/30/2017 1558     ) Recent Results (from the past 240 hour(s))  Urine culture     Status: Abnormal   Collection Time: 07/30/17  8:59 PM  Result Value Ref Range Status   Specimen Description URINE, CATHETERIZED  Final   Special Requests   Final    NONE Performed at [facility], [place] [address], [place], [place] [address]    Culture >=100,000 COLONIES/mL ENTEROCOCCUS FAECALIS (A)  Final   Report Status 08/02/2017 FINAL  Final   Organism ID, Bacteria ENTEROCOCCUS FAECALIS (A)  Final       Susceptibility   Enterococcus faecalis - MIC*    AMPICILLIN <=2 SENSITIVE Sensitive     LEVOFLOXACIN 1 SENSITIVE Sensitive     NITROFURANTOIN <=16 SENSITIVE Sensitive     VANCOMYCIN 1 SENSITIVE Sensitive     * >=100,000 COLONIES/mL ENTEROCOCCUS FAECALIS      Anti-infectives (From admission, onward)   Start     Dose/Rate Route Frequency Ordered Stop   08/01/17 1400  amoxicillin (AMOXIL) capsule 500 mg     500 mg Oral Every 12 hours 08/01/17 1332 08/08/17 0959   07/30/17 2100  cefTRIAXone (ROCEPHIN) 1 g in sodium chloride 0.9 % 100 mL IVPB  Status:  Discontinued     1 g 200 mL/hr over 30 Minutes  Intravenous Every 24 hours 07/30/17 2049 08/01/17 1332       Radiology Studies: No results found.      Scheduled Meds: . amiodarone  200 mg Oral Daily  . amoxicillin  500 mg Oral Q12H  . digoxin  0.125 mg Oral Daily  . diltiazem  60 mg Oral Q8H  . dutasteride  0.5 mg Oral Daily   And  . tamsulosin  0.4 mg Oral Daily  . enoxaparin (LOVENOX) injection  40 mg Subcutaneous Q24H  . feeding supplement (ENSURE ENLIVE)  237 mL Oral TID WC & HS  . furosemide  40 mg Oral BID  . metoprolol tartrate  50 mg Oral BID  . polyethylene glycol  17 g Oral Daily  . QUEtiapine  25 mg Oral Q2000  . senna-docusate  1 tablet Oral BID  . sodium chloride flush  3 mL Intravenous Q12H   Continuous Infusions:    LOS: 2 days    Time spent: 25 min    Geradine Girt, DO [facility] Pager [phone]  If 7PM-7AM, please contact night-coverage [email or web address] Password [facility] 08/02/2017, 1:29 PM

## 2017-08-02 NOTE — Progress Notes (Signed)
CSW spoke with patient's wife. She is also amenable to [facility]. CSW sent referral for assessment.   Percell Locus Aldean Pipe LCSW [phone]

## 2017-08-02 NOTE — Progress Notes (Signed)
CSW received referral for residential hospice. CSW sent referral to [facility] for assessment. They are unsure if they have bed availability for today.   Percell Locus Rian Busche LCSW [phone]

## 2017-08-02 NOTE — Progress Notes (Signed)
Daily Progress Note   Patient Name: Jeremy Macdonald       Date: 08/02/2017 DOB: [birth year]  Age: 84 y.o. MRN#: [id number] Attending Physician: Geradine Girt, DO Primary Care Physician: Vernie Shanks, MD Admit Date: 07/30/2017  Reason for Consultation/Follow-up: Establishing goals of care, Non pain symptom management, Pain control and Psychosocial/spiritual support  Subjective: Patient is asleep in bed with wife at bedside. Easily aroused. Remains confused with congestive cough. Wife reports that he only ate 2-3 bites of his breakfast and drifted back to sleep.   Met with wife ([name]) to discuss her thoughts. She verbalized her and the son spoke in detail last night and have decided that they would like for him to go to a residential hospice facility in [place] if possible. The family resides in [place] and the wife verbalizes she does driver, however, not in the late evenings.   Mr. Lehrmann continues to refuse to eat or drink more than a few bites if anything. His confusion has increased over the past 2-3 days and he has become weaker.   Chart Reviewed.   Length of Stay: 2  Current Medications: Scheduled Meds:  . amiodarone  200 mg Oral Daily  . amoxicillin  500 mg Oral Q12H  . digoxin  0.125 mg Oral Daily  . diltiazem  60 mg Oral Q8H  . dutasteride  0.5 mg Oral Daily   And  . tamsulosin  0.4 mg Oral Daily  . enoxaparin (LOVENOX) injection  40 mg Subcutaneous Q24H  . feeding supplement (ENSURE ENLIVE)  237 mL Oral TID WC & HS  . furosemide  40 mg Oral BID  . metoprolol tartrate  50 mg Oral BID  . polyethylene glycol  17 g Oral Daily  . QUEtiapine  25 mg Oral Q2000  . senna-docusate  1 tablet Oral BID  . sodium chloride flush  3 mL Intravenous Q12H   Continuous  Infusions:   PRN Meds: acetaminophen **OR** acetaminophen, clonazepam, HYDROcodone-homatropine, LORazepam, morphine injection, ondansetron **OR** ondansetron (ZOFRAN) IV  Physical Exam  Constitutional: He is sleeping. He appears ill.  Cardiovascular: Normal rate and regular rhythm.  Pulmonary/Chest: He has decreased breath sounds. He has rhonchi.  Congestive cough   Musculoskeletal:  Generalized weakness   Psychiatric: Cognition and memory are impaired.  Hx of dementia  Vital Signs: BP 128/62 (BP Location: Left Arm)   Pulse 74   Temp 98.5 F (36.9 C) (Axillary)   Resp 20   Ht '5\' 9"'$  (1.753 m)   Wt 72.6 kg (160 lb)   SpO2 100%   BMI 23.63 kg/m  SpO2: SpO2: 100 % O2 Device: O2 Device: Nasal Cannula O2 Flow Rate: O2 Flow Rate (L/min): 2 L/min  Intake/output summary:   Intake/Output Summary (Last 24 hours) at 08/02/2017 1217 Last data filed at 08/02/2017 0950 Gross per 24 hour  Intake 746 ml  Output 1000 ml  Net -254 ml   LBM: Last BM Date: 08/02/17 Baseline Weight: Weight: 69.9 kg (154 lb) Most recent weight: Weight: 72.6 kg (160 lb)       Palliative Assessment/Data: PPS 20%     Patient Active Problem List   Diagnosis Date Noted  . Pressure injury of skin 08/01/2017  . Protein-calorie malnutrition, severe 07/31/2017  . Lung cancer ([place]) 07/30/2017  . UTI (urinary tract infection) due to urinary indwelling Foley catheter ([place]) 07/30/2017  . Hematuria 07/15/2017  . Chronic anticoagulation 07/14/2017  . HTN (hypertension) 07/14/2017  . Pain   . Debility 06/25/2017  . Chronic obstructive pulmonary disease ([place])   . Bilateral hearing loss   . Leukocytosis   . Gross hematuria   . Acute blood loss anemia   . Hypoalbuminemia   . Malnutrition of moderate degree 06/24/2017  . Dementia 06/21/2017  . Hyperglycemia 06/21/2017  . BPH (benign prostatic hyperplasia) 06/21/2017  . Dizziness 06/21/2017  . Atrial fibrillation with rapid ventricular response  ([place]) 06/20/2017  . Atrial fibrillation ([place]) 06/20/2017  . Post PTCA 04/24/2017  . CAD (coronary artery disease), native coronary artery 04/22/2017  . Angina pectoris ([place]) 04/22/2017  . PAF (paroxysmal atrial fibrillation) ([place]) 03/18/2017  . Abnormal nuclear stress test 03/18/2017    Palliative Care Assessment & Plan   Patient Profile: 84 y.o. male  admitted on 07/30/2017 with increased confusion, failure to thrive, and leg swelling. He has a past medical history of A-Fib, CAD S/P PCI 11/18, COPD, dementia, recurrent fall (one a month ago and one recently in SNF), hematuria with chronic indwelling Foley catheter. The family reported that the patient was having delusions and was trying to pull things from the air prior to admission. They denied observing any auditory or visual hallucinations. The family also reported that over a week ago he had a fall at the nursing home with no work-up, however he continues to complain of some left arm pain. Petra Kuba of the fall is unclear. Patient has hematuria with a chronic indwelling Foley catheter (via urology). Athisbaseline ambulates with support and is dependent for most ofhisADLs; as well as medication management.  Assessment: Lung cancer (HCC) PAF (paroxysmal atrial fibrillation) (HCC) CAD (coronary artery disease), native coronary artery Dementia BPH (benign prostatic hyperplasia) Malnutrition of moderate degree Hypoalbuminemia HTN (hypertension) UTI (urinary tract infection) due to urinary indwelling Foley catheter (HCC) Protein-calorie malnutrition, severe Pressure injury of skin  Recommendations/Plan:  DNR/DNI  Agree with attending continue treating the treatable   CSW consult for Residential Hospice placement-family verbalized [facility] location is preferred   Agree with ensure for supplemental nutritional support per Dietitian  Have discontinued statin, aspirin, multi-vitamin  Elevated  HOB  Aspiration precautions  Hycodan cough syrup PRN cough  Morphine IV prn SOB/Pain  Ativan IV for anxiety/agitation   Goals of Care and Additional Recommendations: Limitations on Scope of Treatment:Transitioning to comfort measures  -continue treating the treatable until patient  is able to discharge to hospice facility   Code Status:    Code Status Orders  (From admission, onward)        Start     Ordered   07/30/17 2036  Do not attempt resuscitation (DNR)  Continuous    Question Answer Comment  In the event of cardiac or respiratory ARREST Do not call a "code blue"   In the event of cardiac or respiratory ARREST Do not perform Intubation, CPR, defibrillation or ACLS   In the event of cardiac or respiratory ARREST Use medication by any route, position, wound care, and other measures to relive pain and suffering. May use oxygen, suction and manual treatment of airway obstruction as needed for comfort.      07/30/17 2038        Advance Directive Documentation     Most Recent Value  Type of Advance Directive  Healthcare Power of Attorney, Living will  Pre-existing out of facility DNR order (yellow form or pink MOST form)  No data  "MOST" Form in Place?  No data       Prognosis:   < 2 weeks- given new malignancy, dementia with decreased MS, failure to thrive, very limited mobility, minimal PO intake with an albumin of 1.7, likely aspiration, and other co morbidities.  Discharge Planning:  Hospice facility -as requested per family, unable to care for and provide EOL care at home   Care plan was discussed with family, MD, CSW, and bedside RN.   Thank you for allowing the Palliative Medicine Team to assist in the care of this patient.   Total Time 10mn Prolonged Time Billed  No       Greater than 50%  of this time was spent counseling and coordinating care related to the above assessment and plan.  [employer] AGNP-C. MIhor Dow  FNP-C Palliative Medicine Team  Phone: [phone]: [phone]    Please contact Palliative Medicine Team phone at [phone] questions and concerns.

## 2017-08-02 NOTE — Progress Notes (Signed)
[facility] Liaison: RN visit  Received request from Cedric Fishman, [place] for family interest in [facility]. Chart reviewed and spoke with his wife to acknowledge referral. Unfortunately [facility] is not able to offer a room today. Family and CSW aware [facility] liaison will follow with CSW and family tomorrow or sooner if room becomes available. Please do not hesitate to call with questions.  Thank you,  Farrel Gordon, RN, [facility] Liaison [phone]  [facility] hospital liaisons are on [place]

## 2017-08-03 DIAGNOSIS — E440 Moderate protein-calorie malnutrition: Secondary | ICD-10-CM

## 2017-08-03 DIAGNOSIS — R338 Other retention of urine: Secondary | ICD-10-CM

## 2017-08-03 DIAGNOSIS — C3491 Malignant neoplasm of unspecified part of right bronchus or lung: Secondary | ICD-10-CM

## 2017-08-03 DIAGNOSIS — F0280 Dementia in other diseases classified elsewhere without behavioral disturbance: Secondary | ICD-10-CM

## 2017-08-03 DIAGNOSIS — N401 Enlarged prostate with lower urinary tract symptoms: Secondary | ICD-10-CM

## 2017-08-07 DIAGNOSIS — Z515 Encounter for palliative care: Secondary | ICD-10-CM

## 2017-09-03 ENCOUNTER — Encounter: Payer: Self-pay | Admitting: Physician Assistant

## 2017-09-03 NOTE — Progress Notes (Signed)
Patient will DC to: [facility] Anticipated DC date: 08-15-2017 Family notified: Spouse Transport by: Corey Harold   Per MD patient ready for DC to [facility]. RN, patient, patient's family, and facility notified of DC. Discharge Summary sent to facility. RN given number for report [phone]). DC packet on chart. Ambulance transport requested for patient.   CSW signing off.  Cedric Fishman, LCSW Clinical Social Worker [phone]

## 2017-09-03 NOTE — Care Management Important Message (Signed)
Important Message  Patient Details  Name: Jeremy Macdonald MRN: [id number] Date of Birth: 08/04/1925   Medicare Important Message Given:  Yes    Errol [address] September 01, 2017, 3:58 PM

## 2017-09-03 NOTE — Consult Note (Signed)
   [facility] CM Inpatient Consult   2017/08/26  Jeremy Macdonald Aug 25, 1925 [id number]  Chart reviewed for high risk and multiple admissions in the past. Chart reviewed and patient had previously been out reached by [facility] program, as patient had declined the services after getting home from previous admission and wife also declined [facility] services earlier this month, as well..  Chart review reveals the patient is now for Hospice care and [facility] has been consulted for hospice care.  [facility] Care Management will sign off.  Patient accept for bed with Hospice.  Natividad Brood, RN BSN [facility] Liaison  [phone] business mobile phone Toll free office [phone]

## 2017-09-03 NOTE — Consult Note (Signed)
[facility]:   Met with pt's family and pt. Discussed hospice care at the hospice home and there goals of care. The pt was approved by our medical director and the family has accepted the bed for hospice care at [facility] in [facility]. Jearl Klinefelter   [phone]

## 2017-09-03 NOTE — Discharge Summary (Signed)
Physician Discharge Summary  Jeremy Macdonald SNK:[id number] DOB: 06-25-1925 DOA: 07/30/2017  PCP: Vernie Shanks, MD  Admit date: 07/30/2017 Discharge date: 08/31/2017  Admitted From: home Disposition:  Residential hospice  Recommendations for Outpatient Follow-up:  1. Discharge to residential hospice  Home Health: none Equipment/Devices: none  Discharge Condition: guarded CODE STATUS: DNR Diet recommendation: comfort feeding   HPI: Per Dr. Posey Pronto, Jeremy Macdonald is a 84 y.o. male with Past medical history of A. fib, CAD S/P PCI 1118, COPD, dementia, recurrent fall, hematuria with chronic indwelling Foley catheter. Patient was brought in due to worsening leg swelling.  Family noted this 5 days ago.  Patient was started on oral Lasix as well as Haematologist. Patient also had confusion.  Reported by family the patient was headache delusions and was trying to pull things from the air.  Did not have any auditory or visual hallucination. 1 week ago patient had a fall at the nursing home as well no workup was performed per family. Petra Kuba of the fall is not been unclear. Patient at the time of my evaluation denies any acute complaint no nausea no vomiting no breathing. No diarrhea no constipation reported. Patient has hematuria as well as chronic indwelling Foley catheter but currently no hematuria.   Hospital Course: Lung cancer [facility]) -significant failure to thrive. -CT scan of the chest was performed which shows a 3 cm x 4.5 cm lobulated irregular mass in the right lobe along with a 14 mm hilar lymph node consistent with a possible malignancy as well as metastatic disease. - not a great candidate for any chemotherapy or radiation going in the future given his multiple comorbidities as well as severe dementia and they agreed with it. -palliative care meeting on 2/27 and comfort now on focus Failure to thrive, leg edema. -see above re: palliative care Acute encephalopathy. Metabolic  likely in the setting of UTI hasChronic indwelling Foley catheter, continue for comfort Protein calorieMalnutrition. Moderate. Chronic A. fib. Abnormal TSH -TSH is elevated -free t4 normal Low B12 -defer replacement for comfort History of CAD with Elevated troponin. Not consistent with ACS pattern. -more likely demand ischemia.    Discharge Diagnoses:  Principal Problem:   Lung cancer ([place]) Active Problems:   PAF (paroxysmal atrial fibrillation) (HCC)   CAD (coronary artery disease), native coronary artery   Dementia   BPH (benign prostatic hyperplasia)   Malnutrition of moderate degree   Hypoalbuminemia   HTN (hypertension)   UTI (urinary tract infection) due to urinary indwelling Foley catheter (HCC)   Protein-calorie malnutrition, severe   Pressure injury of skin   Palliative care by specialist   Terminal care     Discharge Instructions   Allergies as of Aug 31, 2017      Reactions   Phenergan [promethazine Hcl] Other (See Comments)   Confusion       Medication List    STOP taking these medications   atorvastatin 10 MG tablet Commonly known as:  LIPITOR   diphenhydrAMINE 25 mg capsule Commonly known as:  BENADRYL   feeding supplement (ENSURE ENLIVE) Liqd   megestrol 400 MG/10ML suspension Commonly known as:  MEGACE   Melatonin 3 MG Tabs     TAKE these medications   acetaminophen 325 MG tablet Commonly known as:  TYLENOL Take 1-2 tablets (325-650 mg total) by mouth every 4 (four) hours as needed for mild pain.   amiodarone 200 MG tablet Commonly known as:  PACERONE Take 1 tablet (200 mg total) by  mouth daily.   aspirin EC 81 MG tablet Take 1 tablet (81 mg total) by mouth daily.   clonazePAM 0.5 MG tablet Commonly known as:  KLONOPIN Take 0.5 tablets (0.25 mg total) by mouth 3 (three) times daily as needed (severe agitation).   digoxin 0.125 MG tablet Commonly known as:  LANOXIN Take 1 tablet (0.125 mg total) by mouth daily.   diltiazem  60 MG tablet Commonly known as:  CARDIZEM Take 1 tablet (60 mg total) by mouth every 8 (eight) hours.   Dutasteride-Tamsulosin HCl 0.5-0.4 MG Caps Commonly known as:  JALYN Take 1 tablet by mouth daily.   furosemide 40 MG tablet Commonly known as:  LASIX Take 40 mg by mouth 2 (two) times daily.   metoprolol tartrate 50 MG tablet Commonly known as:  LOPRESSOR Take 1 tablet (50 mg total) by mouth 2 (two) times daily.   multivitamin with minerals Tabs tablet Take 1 tablet by mouth daily.   polyethylene glycol packet Commonly known as:  MIRALAX / GLYCOLAX Take 17 g by mouth daily.   QUEtiapine 25 MG tablet Commonly known as:  SEROQUEL Take 1 tablet (25 mg total) by mouth daily at 8 pm.   senna-docusate 8.6-50 MG tablet Commonly known as:  Senokot-S Take 1 tablet by mouth 2 (two) times daily.        Consultations:  Palliative   Procedures/Studies:  Dg Elbow Complete Left (3+view)  Result Date: 07/30/2017 CLINICAL DATA:  Fall, elbow pain. EXAM: LEFT ELBOW - COMPLETE 3+ VIEW COMPARISON:  None. FINDINGS: Osseous alignment is normal. No fracture line or displaced fracture fragment identified. No significant degenerative change. No appreciable joint effusion and adjacent soft tissues are unremarkable. IMPRESSION: Negative. Electronically Signed   By: Franki Cabot M.D.   On: 07/30/2017 16:43   Dg Wrist Complete Left  Result Date: 07/30/2017 CLINICAL DATA:  Fall, elbow and wrist pain.  Bruising to left wrist. EXAM: LEFT WRIST - COMPLETE 3+ VIEW COMPARISON:  None. FINDINGS: Osseous alignment is normal. No fracture line or displaced fracture fragment identified. Soft tissues about the left wrist are unremarkable. IMPRESSION: Negative. Electronically Signed   By: Franki Cabot M.D.   On: 07/30/2017 16:42   Ct Head Wo Contrast  Result Date: 07/30/2017 CLINICAL DATA:  84 year old male with steady decline. Initial encounter. EXAM: CT HEAD WITHOUT CONTRAST TECHNIQUE: Contiguous axial  images were obtained from the base of the skull through the vertex without intravenous contrast. COMPARISON:  06/20/2017. FINDINGS: Brain: No intracranial hemorrhage or CT evidence of large acute infarct. Prominent chronic microvascular changes. Moderate global atrophy. No intracranial mass lesion noted on this unenhanced exam. Vascular: Vascular calcifications Skull: No skull fracture Sinuses/Orbits: No acute orbital abnormality. Minimal mucosal thickening inferior maxillary sinuses. Other: Mastoid air cells and middle ear cavities are clear. IMPRESSION: No acute intracranial abnormality. Specifically, no evidence of intracranial hemorrhage or CT evidence of large acute infarct. Prominent chronic microvascular changes. Atrophy. Electronically Signed   By: Genia Del M.D.   On: 07/30/2017 17:53   Ct Angio Chest Pe W And/or Wo Contrast  Result Date: 07/30/2017 CLINICAL DATA:  Shortness of breath. EXAM: CT ANGIOGRAPHY CHEST WITH CONTRAST TECHNIQUE: Multidetector CT imaging of the chest was performed using the standard protocol during bolus administration of intravenous contrast. Multiplanar CT image reconstructions and MIPs were obtained to evaluate the vascular anatomy. CONTRAST:  152mL ISOVUE-370 IOPAMIDOL (ISOVUE-370) INJECTION 76% COMPARISON:  Radiograph of same day. FINDINGS: Cardiovascular: Satisfactory opacification of the pulmonary arteries to the segmental  level. No evidence of pulmonary embolism. Normal heart size. No pericardial effusion. Coronary artery calcifications are noted. Mediastinum/Nodes: Thyroid gland and esophagus are unremarkable. 14 mm right hilar lymph node is noted. Lungs/Pleura: 29 x 14 mm lobulated irregular density is noted in right lower lobe concerning for malignancy. No pneumothorax or pleural effusion is noted. Mild emphysematous disease is noted in the upper lobes. Probable scarring is noted in superior segment of right lower lobe medially. Mild bibasilar subsegmental  atelectasis is noted. Upper Abdomen: Probable hepatic cysts are noted. Musculoskeletal: No chest wall abnormality. No acute or significant osseous findings. Review of the MIP images confirms the above findings. IMPRESSION: No definite evidence of pulmonary embolus. 29 x 14 mm lobulated irregular density is noted in right lower lobe consistent with malignancy. 14 mm right hilar lymph node is also noted concerning for metastatic disease. Coronary artery calcifications are noted suggesting coronary artery disease. Aortic Atherosclerosis (ICD10-I70.0) and Emphysema (ICD10-J43.9). Electronically Signed   By: Marijo Conception, M.D.   On: 07/30/2017 18:07   Ct Abdomen Pelvis W Contrast  Result Date: 07/15/2017 CLINICAL DATA:  Abdominal distention EXAM: CT ABDOMEN AND PELVIS WITH CONTRAST TECHNIQUE: Multidetector CT imaging of the abdomen and pelvis was performed using the standard protocol following bolus administration of intravenous contrast. CONTRAST:  134mL ISOVUE-300 IOPAMIDOL (ISOVUE-300) INJECTION 61% COMPARISON:  Plain films earlier today.  CT 11/29/2016 FINDINGS: Lower chest: Small right pleural effusion. Dependent atelectasis in the lower lobes. Nodular appearing density in the right lower lobe on image 7 measures up to 16 mm. This could reflect early infiltrate/pneumonia. This was not present on prior CT abdomen. Hepatobiliary: Scattered low densities in the liver compatible with cysts. Gallbladder unremarkable. No biliary ductal dilatation. Pancreas: No focal abnormality or ductal dilatation. Spleen: No focal abnormality.  Normal size. Adrenals/Urinary Tract: No adrenal mass. No hydronephrosis. Small cysts in the kidneys bilaterally. Foley catheter in the bladder which is decompressed. Stomach/Bowel: Left colonic diverticulosis. No active diverticulitis. Upper abdominal small bowel loops are mildly prominent, but there is no focal transition point. I favor this represents normal bowel gas pattern or slight  ileus. Stomach is decompressed, unremarkable. Vascular/Lymphatic: Infrarenal abdominal aortic aneurysm, 4.7 cm maximally, stable since prior study. Extensive mural plaque. 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 No adenopathy. Reproductive: Marked prostate enlargement. Other: No free fluid or free air. Musculoskeletal: No acute bony abnormality. IMPRESSION: No evidence of bowel obstruction. 4.7 cm infrarenal abdominal aortic aneurysm. 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 Findings are stable. Left colonic diverticulosis.  No active diverticulitis. Marked prostate enlargement. Small right pleural effusion. Nodular airspace opacity in the right lower lobe could reflect early infiltrate/pneumonia. Recommend follow-up CT after treatment to exclude pulmonary nodule. Electronically Signed   By: Rolm Baptise M.D.   On: 07/15/2017 13:31   Dg Chest Port 1 View  Result Date: 07/30/2017 CLINICAL DATA:  Cough. EXAM: PORTABLE CHEST 1 VIEW COMPARISON:  06/20/2017 and back to 05/21/2014. FINDINGS: Numerous leads and wires project over the chest. Midline trachea. Moderate cardiomegaly. Atherosclerosis in the transverse aorta. No pleural effusion or pneumothorax. Hyperinflation. Right mid lung spiculated density measures on the order of 3.0 x 2.1 cm. IMPRESSION: Right mid lung spiculated density is suspicious for primary bronchogenic carcinoma. Further evaluation with chest CT (ideally with contrast) recommended. Cardiomegaly.  Aortic Atherosclerosis (ICD10-I70.0). These results were called by telephone at the time of interpretation on 07/30/2017 at 3:46 pm to [facility], r.n., who verbally acknowledged these results. Electronically  Signed   By: Abigail Miyamoto M.D.   On: 07/30/2017 15:49   Dg Shoulder Left  Result Date: 07/30/2017 CLINICAL DATA:  Fall.  Bruising. EXAM: LEFT SHOULDER - 2+ VIEW COMPARISON:  06/20/2017 chest radiograph. FINDINGS: AP and scapular views. Visualized portion of the  left hemithorax is normal. No acute fracture or dislocation. IMPRESSION: No acute osseous abnormality. Electronically Signed   By: Abigail Miyamoto M.D.   On: 07/30/2017 16:42   Dg Abd 2 Views  Result Date: 07/15/2017 CLINICAL DATA:  Abdominal distention EXAM: ABDOMEN - 2 VIEW COMPARISON:  None. FINDINGS: Mild gaseous distention of small bowel loops. No free air organomegaly. No suspicious calcification. Vascular calcifications noted. IMPRESSION: Mild gaseous distention of small bowel loops. This could reflect early small bowel obstruction or mild ileus. Electronically Signed   By: Rolm Baptise M.D.   On: 07/15/2017 10:50      Subjective: unresponsive  Discharge Exam: Vitals:   August 09, 2017 0523 09-Aug-2017 0800  BP: 138/61 98/68  Pulse: 75 74  Resp: 16 18  Temp: 97.8 F (36.6 C) 98.4 F (36.9 C)  SpO2: 99%     General: unresponsive     The results of significant diagnostics from this hospitalization (including imaging, microbiology, ancillary and laboratory) are listed below for reference.     Microbiology: Recent Results (from the past 240 hour(s))  Urine culture     Status: Abnormal   Collection Time: 07/30/17  8:59 PM  Result Value Ref Range Status   Specimen Description URINE, CATHETERIZED  Final   Special Requests   Final    NONE Performed at [facility], [address], [place], [place] [address]    Culture >=100,000 COLONIES/mL ENTEROCOCCUS FAECALIS (A)  Final   Report Status 08/02/2017 FINAL  Final   Organism ID, Bacteria ENTEROCOCCUS FAECALIS (A)  Final      Susceptibility   Enterococcus faecalis - MIC*    AMPICILLIN <=2 SENSITIVE Sensitive     LEVOFLOXACIN 1 SENSITIVE Sensitive     NITROFURANTOIN <=16 SENSITIVE Sensitive     VANCOMYCIN 1 SENSITIVE Sensitive     * >=100,000 COLONIES/mL ENTEROCOCCUS FAECALIS     Labs: BNP (last 3 results) Recent Labs    06/20/17 1033 07/30/17 2059  BNP 806.8* 188.4*   Basic Metabolic Panel: Recent Labs  Lab  07/30/17 1545 07/31/17 1157  NA 140 138  K 4.1 4.0  CL 100* 101  CO2 30 27  GLUCOSE 132* 132*  BUN 26* 22*  CREATININE 1.14 0.97  CALCIUM 8.3* 8.1*   Liver Function Tests: Recent Labs  Lab 07/30/17 1545 07/31/17 1157  AST 31 26  ALT 25 22  ALKPHOS 56 54  BILITOT 0.6 0.6  PROT 5.8* 5.4*  ALBUMIN 1.8* 1.7*   No results for input(s): LIPASE, AMYLASE in the last 168 hours. Recent Labs  Lab 07/30/17 1545  AMMONIA <9*   CBC: Recent Labs  Lab 07/30/17 1545 07/31/17 0936  WBC 9.7 8.8  NEUTROABS 7.9* 6.6  HGB 9.6* 10.0*  HCT 31.4* 32.8*  MCV 93.5 92.4  PLT 222 153   Cardiac Enzymes: Recent Labs  Lab 07/30/17 1545 07/30/17 2059 07/31/17 0320 07/31/17 0936  TROPONINI 0.06* 0.06* 0.06* 0.06*   BNP: Invalid input(s): POCBNP CBG: Recent Labs  Lab 07/31/17 2052  GLUCAP 129*   D-Dimer No results for input(s): DDIMER in the last 72 hours. Hgb A1c No results for input(s): HGBA1C in the last 72 hours. Lipid Profile No results for input(s):  CHOL, HDL, LDLCALC, TRIG, CHOLHDL, LDLDIRECT in the last 72 hours. Thyroid function studies No results for input(s): TSH, T4TOTAL, T3FREE, THYROIDAB in the last 72 hours.  Invalid input(s): FREET3 Anemia work up No results for input(s): VITAMINB12, FOLATE, FERRITIN, TIBC, IRON, RETICCTPCT in the last 72 hours. Urinalysis    Component Value Date/Time   COLORURINE YELLOW 07/30/2017 1558   APPEARANCEUR CLOUDY (A) 07/30/2017 1558   LABSPEC 1.011 07/30/2017 1558   PHURINE 7.0 07/30/2017 1558   GLUCOSEU NEGATIVE 07/30/2017 1558   HGBUR LARGE (A) 07/30/2017 1558   BILIRUBINUR NEGATIVE 07/30/2017 1558   KETONESUR NEGATIVE 07/30/2017 1558   PROTEINUR NEGATIVE 07/30/2017 1558   UROBILINOGEN 1.0 08/15/2009 1743   NITRITE NEGATIVE 07/30/2017 1558   LEUKOCYTESUR LARGE (A) 07/30/2017 1558   Sepsis Labs Invalid input(s): PROCALCITONIN,  WBC,  LACTICIDVEN   Time coordinating discharge: 25 minutes  SIGNED:  Marzetta Board,  MD  [facility] 08-13-2017, 9:08 AM Pager [phone]  If 7PM-7AM, please contact night-coverage [email or web address] Password [email or web address]

## 2017-09-03 NOTE — Progress Notes (Signed)
[facility] Liaison:  RN  Received request from [place], [place], for family interest in [facility].  Chart reviewed and Bevely Palmer acknowledged referral with family on 08/02/17.  Unfortunately, [facility] is not able to offer a room today.  Percell Locus, CSW, and [name], spouse,  aware [facility] liaison will follow up with CSW and family tomorrow or sooner if room becomes available.  Please do not hesitate to call with questions.    Thank you for this referral.   Edyth Gunnels, RN, [facility] Liaison [phone]  All hospital liaisons are now on [place].

## 2017-09-03 DEATH — deceased

## 2017-09-04 DIAGNOSIS — R319 Hematuria, unspecified: Secondary | ICD-10-CM

## 2017-09-04 DIAGNOSIS — N390 Urinary tract infection, site not specified: Secondary | ICD-10-CM

## 2019-02-08 IMAGING — CT CT MAXILLOFACIAL W/O CM
4 of 10 series · 15 of 47 positions shown, 17 images · non-contrast
Comparison: 08/15/2009 head CT

CLINICAL DATA: Fell today and struck head and face.

EXAM:
CT HEAD WITHOUT CONTRAST
CT MAXILLOFACIAL WITHOUT CONTRAST
CT CERVICAL SPINE WITHOUT CONTRAST
TECHNIQUE: Multidetector CT imaging of the head, cervical spine, and
maxillofacial structures were performed using the standard protocol
without intravenous contrast. Multiplanar CT image reconstructions
of the cervical spine and maxillofacial structures were also
generated.

[Series 11: orthogonal axials · axial · 0.23mm/px · z∈[-303,-167]mm · 7 of 96 slices shown, 9 images]
[im 12/96  brain]
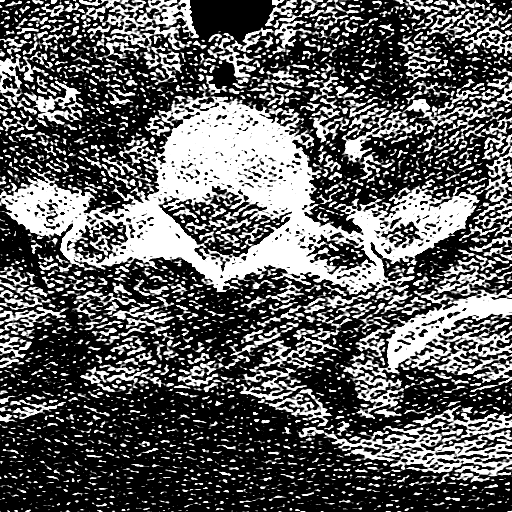
[im 12/96  bone]
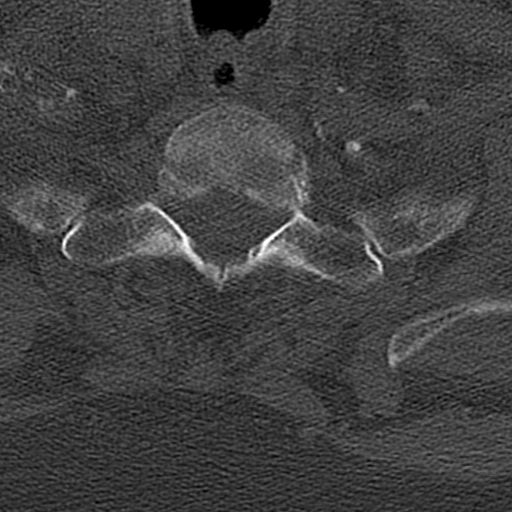
[im 24/96  bone]
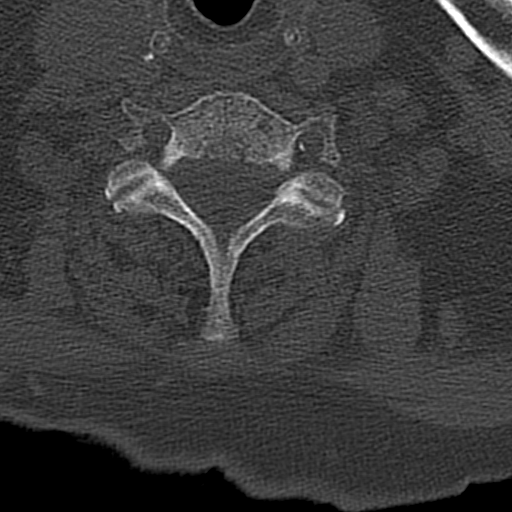
[im 36/96  bone]
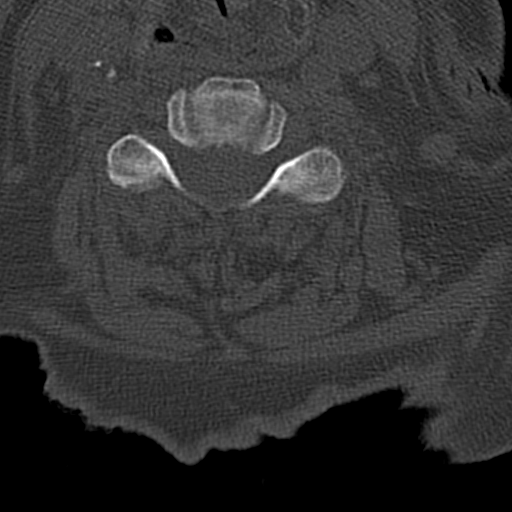
[im 48/96  bone]
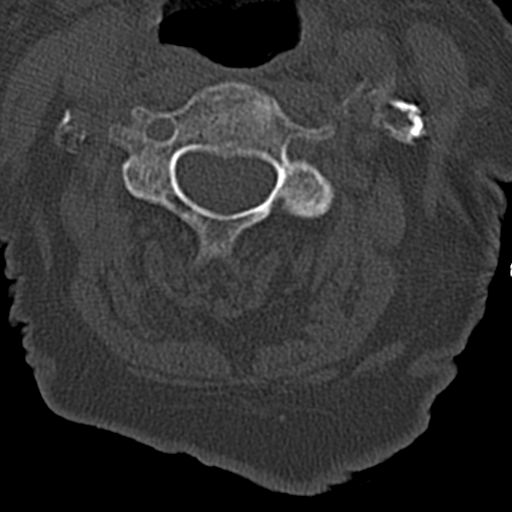
[im 60/96  brain]
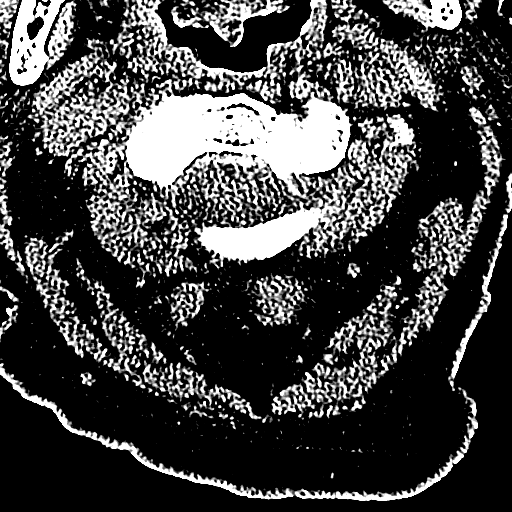
[im 60/96  bone]
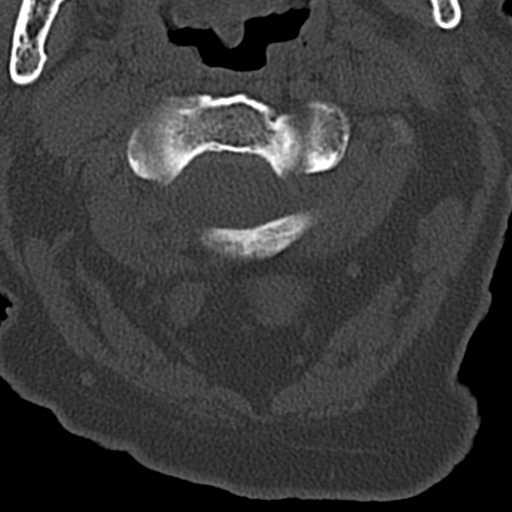
[im 72/96  bone]
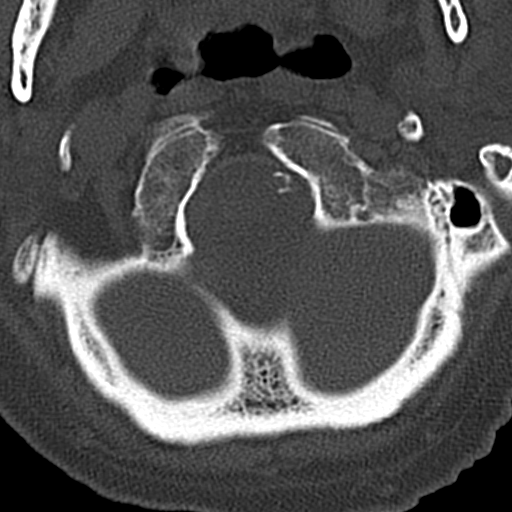
[im 84/96  bone]
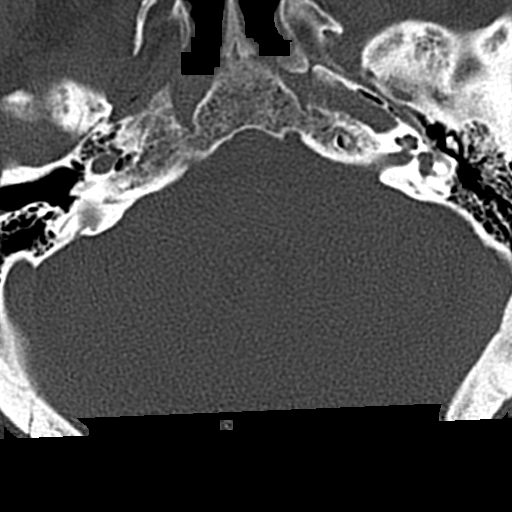

[Series 13: sagittal bone · sagittal · 0.27mm/px · 1 of 55 slices shown]
[im 28/55  bone]
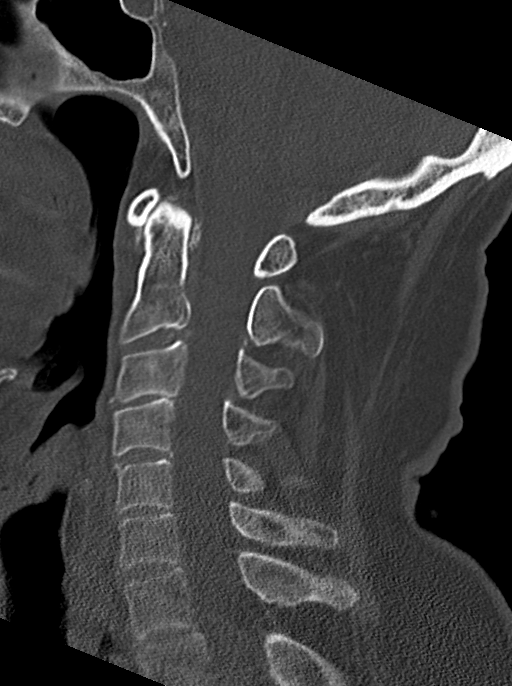

[Series 14: max soft · axial · 0.38mm/px · z∈[-254,-188]mm · 4 of 100 slices shown]
[im 12/100  brain]
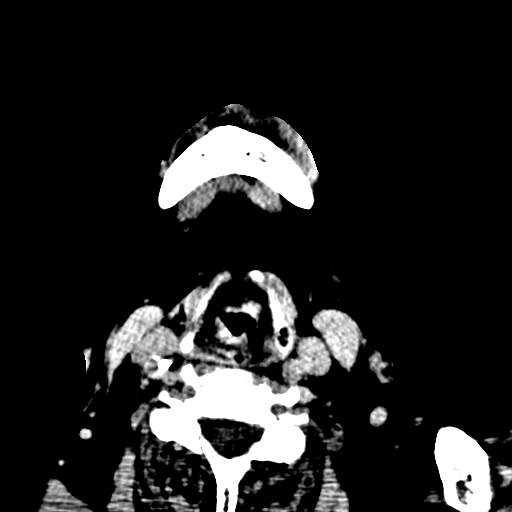
[im 23/100  brain]
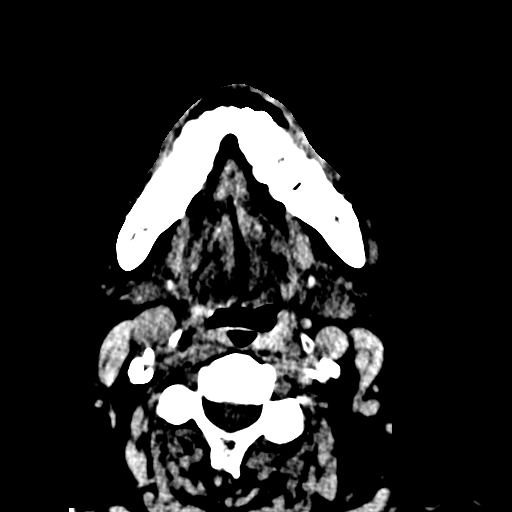
[im 34/100  brain]
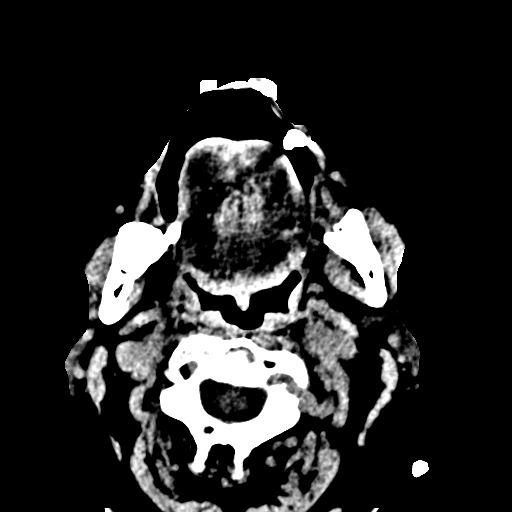
[im 45/100  brain]
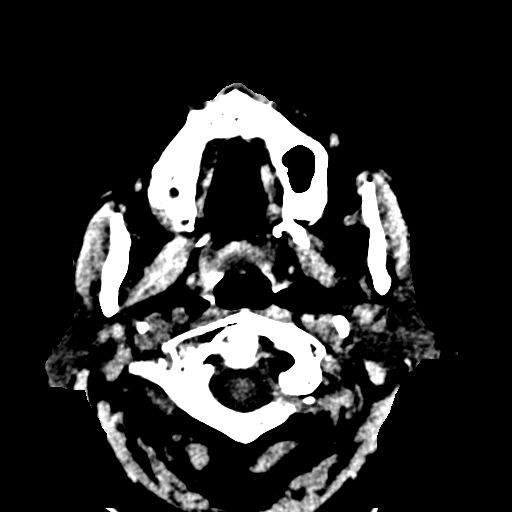

[Series 18: coronal soft · coronal · 0.40mm/px · 3 of 90 slices shown]
[im 23/90  bone]
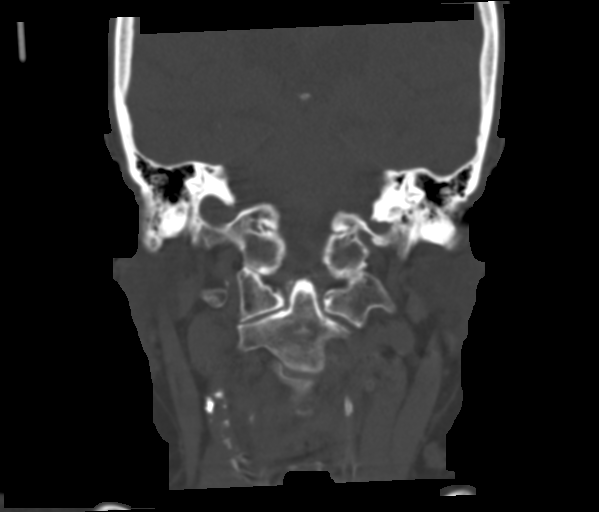
[im 45/90  bone]
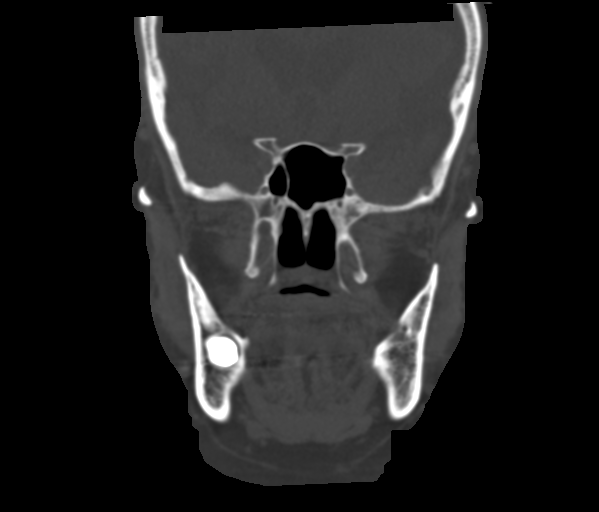
[im 67/90  bone]
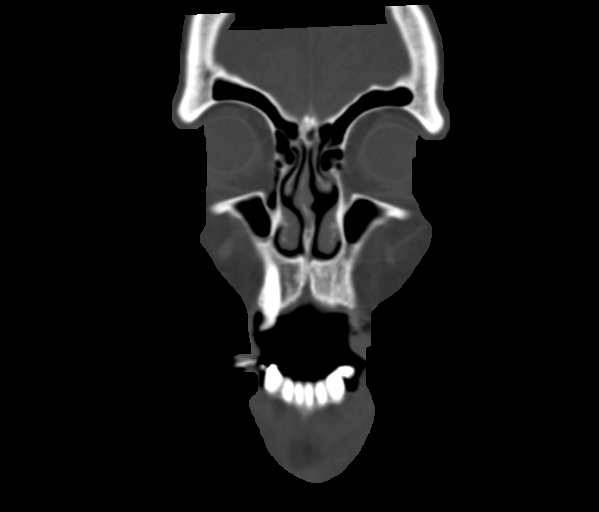

[15 of 47 positions shown; findings below may reference images not displayed]

FINDINGS: CT HEAD FINDINGS

Brain: Progressive age related cerebral atrophy, ventriculomegaly
and periventricular white matter disease. No extra-axial fluid
collections are identified. No CT findings for acute hemispheric
infarction or intracranial hemorrhage. No mass lesions. The
brainstem and cerebellum are normal.

Vascular: No hyperdense vessels or aneurysm. Stable vascular
calcifications.

Skull: No acute skull fracture or bone lesion.

Other: No scalp laceration or hematoma.

CT MAXILLOFACIAL FINDINGS

Osseous: Small fractures involving the left nasal bone and bony is a
bridge. There also multiple lacerations and radiopaque foreign
bodies in the soft tissues overlying the nose. The bony nasal septum
is intact.

No other acute facial bone fractures are identified. The mandibular
condyles are normally located. No mandible fracture. Torus
mandibularis noted incidentally.

Orbits: The orbital bones are intact.  The globes are intact.

Sinuses: Mucous retention cysts or possible polyps in the maxillary
sinuses. The other paranasal sinuses are clear and the mastoid air
cells and middle ear cavities are clear.

Soft tissues: Soft tissue swelling and lacerations involving the
nose.

CT CERVICAL SPINE FINDINGS

Alignment: Normal

Skull base and vertebrae: No acute fracture.

Soft tissues and spinal canal: No prevertebral fluid or swelling. No
visible canal hematoma.

Disc levels: No significant disc protrusions, spinal or foraminal
stenosis.

Upper chest: The lung apices are grossly clear.

Other: Extensive carotid artery calcifications.
IMPRESSION: 1. Progressive cerebral atrophy, ventriculomegaly and
periventricular white matter disease.
2. No new/acute intracranial findings or skull fracture.
3. Small nasal bone fractures and multiple lacerations and small
radiopaque foreign bodies involving the soft tissues of the nose.
4. No acute cervical spine fracture.
